# Patient Record
Sex: Male | Born: 1935 | Hispanic: No | Marital: Married | Smoking: Former smoker
Health system: Southern US, Community
[De-identification: ages and names within clinical notes are randomized; demographics above are authoritative.]

## PROBLEM LIST (undated history)

## (undated) DIAGNOSIS — IMO0002 Reserved for concepts with insufficient information to code with codable children: Secondary | ICD-10-CM

## (undated) DIAGNOSIS — R42 Dizziness and giddiness: Secondary | ICD-10-CM

## (undated) DIAGNOSIS — E1165 Type 2 diabetes mellitus with hyperglycemia: Secondary | ICD-10-CM

## (undated) DIAGNOSIS — M5126 Other intervertebral disc displacement, lumbar region: Secondary | ICD-10-CM

## (undated) DIAGNOSIS — E785 Hyperlipidemia, unspecified: Secondary | ICD-10-CM

## (undated) DIAGNOSIS — K2950 Unspecified chronic gastritis without bleeding: Secondary | ICD-10-CM

## (undated) DIAGNOSIS — I10 Essential (primary) hypertension: Secondary | ICD-10-CM

## (undated) HISTORY — DX: Unspecified chronic gastritis without bleeding: K29.50

## (undated) HISTORY — PX: BACK SURGERY: SHX140

## (undated) HISTORY — DX: Type 2 diabetes mellitus with hyperglycemia: E11.65

## (undated) HISTORY — DX: Dizziness and giddiness: R42

## (undated) HISTORY — DX: Reserved for concepts with insufficient information to code with codable children: IMO0002

## (undated) HISTORY — PX: COLONOSCOPY: SHX174

## (undated) HISTORY — DX: Other intervertebral disc displacement, lumbar region: M51.26

## (undated) HISTORY — DX: Essential (primary) hypertension: I10

---

## 2004-09-25 ENCOUNTER — Ambulatory Visit (HOSPITAL_COMMUNITY): Admission: RE | Admit: 2004-09-25 | Discharge: 2004-09-25 | Payer: Self-pay | Admitting: *Deleted

## 2004-09-25 ENCOUNTER — Encounter (INDEPENDENT_AMBULATORY_CARE_PROVIDER_SITE_OTHER): Payer: Self-pay | Admitting: Specialist

## 2007-02-19 ENCOUNTER — Encounter: Admission: RE | Admit: 2007-02-19 | Discharge: 2007-02-19 | Payer: Self-pay | Admitting: Internal Medicine

## 2010-12-28 NOTE — Op Note (Signed)
NAME:  Aaron Blair, Aaron Blair                  ACCOUNT NO.:  [id number]   MEDICAL RECORD NO.:  [id number]          PATIENT TYPE:  AMB   LOCATION:  ENDO                         FACILITY:  [facility]   PHYSICIAN:  Georgiana Spinner, M.D.    DATE OF BIRTH:  Sep 14, 1933   DATE OF PROCEDURE:  09/25/2004  DATE OF DISCHARGE:                                 OPERATIVE REPORT   PROCEDURE:  Endoscopy.   ANESTHESIA:  Demerol 50 mg, Versed 5 mg.   INDICATIONS:  Rectal bleeding.   PROCEDURE:  With the patient mildly sedated in the left lateral decubitus  position, the Olympus videoscopic endoscope was inserted in the mouth,  passed under direct vision into the esophagus, which appeared normal.  We  entered into the stomach.  The fundus, body, antrum, duodenal bulb, second  portion of duodenum were visualized.  From this point the endoscope was  slowly withdrawn taking circumferential views of the duodenal mucosa until  the endoscope had been pulled back into the stomach, placed in retroflexion  to view the stomach from below.  An incomplete wrap of the GE junction  around the endoscope was noted.  The endoscope was then straightened and  withdrawn taking circumferential views of the remaining gastric and  esophageal mucosa, stopping in the body of the stomach to biopsy  erythematous changes seen.  The patient's vital signs and pulse oximetry  remained stable.  The patient tolerated the procedure well without apparent  complications.   FINDINGS:  Erythematous changes of body of stomach, biopsied.   Await biopsy report.  The patient will call me for results and follow up  with me as an outpatient.  Proceed to colonoscopy as planned.      GMO/MEDQ  D:  09/25/2004  T:  09/25/2004  Job:  [id number]

## 2010-12-28 NOTE — Op Note (Signed)
NAME:  Willaims, Mivaan                  ACCOUNT NO.:  [id number]   MEDICAL RECORD NO.:  [id number]          PATIENT TYPE:  AMB   LOCATION:  ENDO                         FACILITY:  [facility]   PHYSICIAN:  Georgiana Spinner, M.D.    DATE OF BIRTH:  1934-02-03   DATE OF PROCEDURE:  DATE OF DISCHARGE:                                 OPERATIVE REPORT   PROCEDURE:  Colonoscopy.   INDICATIONS:  Colon polyp, rectal bleed.   ANESTHESIA:  Demerol 25 mg.   PROCEDURE:  With the patient mildly sedated in the left lateral decubitus  position, the Olympus videoscopic colonoscope was inserted into the rectum,  passed under direct vision to the cecum identified by ileocecal valve and  appendiceal orifice both of which were photographed.  From this point, the  colonoscope was slowly withdrawn taking circumferential views of the colonic  mucosa stopping at 25 cm from the anal verge at which point a polyp was  seen, photographed and removed using hot biopsy forceps technique, setting  at 20/200 blended current.  The endoscope was then withdrawn to the rectum  which appeared normal on direct and showed hemorrhoidal tissue on  retroflexed view.  The endoscope was straightened and withdrawn.  The  patient's vital signs and pulse oximeter remained stable.  The patient  tolerated the procedure well without apparent complications.   FINDINGS:  Internal hemorrhoids, polyp at 25 cm.  Await biopsy result.  The  patient will call me for results and follow up with me as an outpatient.      GMO/MEDQ  D:  09/25/2004  T:  09/25/2004  Job:  [id number]

## 2012-03-11 ENCOUNTER — Other Ambulatory Visit: Payer: Self-pay | Admitting: Internal Medicine

## 2012-03-11 DIAGNOSIS — R7989 Other specified abnormal findings of blood chemistry: Secondary | ICD-10-CM

## 2012-03-25 ENCOUNTER — Ambulatory Visit
Admission: RE | Admit: 2012-03-25 | Discharge: 2012-03-25 | Disposition: A | Discharge status: Home / Self Care | Payer: Medicare Other | Source: Ambulatory Visit | Attending: Internal Medicine | Admitting: Internal Medicine

## 2012-03-25 DIAGNOSIS — R7989 Other specified abnormal findings of blood chemistry: Secondary | ICD-10-CM

## 2013-08-25 ENCOUNTER — Other Ambulatory Visit: Payer: Self-pay | Admitting: Internal Medicine

## 2013-08-25 DIAGNOSIS — R413 Other amnesia: Secondary | ICD-10-CM

## 2013-08-28 ENCOUNTER — Ambulatory Visit
Admission: RE | Admit: 2013-08-28 | Discharge: 2013-08-28 | Disposition: A | Discharge status: Home / Self Care | Payer: Medicare Other | Source: Ambulatory Visit | Attending: Internal Medicine | Admitting: Internal Medicine

## 2013-08-28 DIAGNOSIS — R413 Other amnesia: Secondary | ICD-10-CM

## 2014-09-13 DIAGNOSIS — E1165 Type 2 diabetes mellitus with hyperglycemia: Secondary | ICD-10-CM | POA: Diagnosis not present

## 2014-09-13 DIAGNOSIS — I10 Essential (primary) hypertension: Secondary | ICD-10-CM | POA: Diagnosis not present

## 2014-10-10 DIAGNOSIS — E1122 Type 2 diabetes mellitus with diabetic chronic kidney disease: Secondary | ICD-10-CM | POA: Diagnosis not present

## 2014-10-26 DIAGNOSIS — E1165 Type 2 diabetes mellitus with hyperglycemia: Secondary | ICD-10-CM | POA: Diagnosis not present

## 2014-10-26 DIAGNOSIS — R809 Proteinuria, unspecified: Secondary | ICD-10-CM | POA: Diagnosis not present

## 2015-01-04 DIAGNOSIS — Z0000 Encounter for general adult medical examination without abnormal findings: Secondary | ICD-10-CM | POA: Diagnosis not present

## 2015-01-04 DIAGNOSIS — Z1389 Encounter for screening for other disorder: Secondary | ICD-10-CM | POA: Diagnosis not present

## 2015-01-04 DIAGNOSIS — E1165 Type 2 diabetes mellitus with hyperglycemia: Secondary | ICD-10-CM | POA: Diagnosis not present

## 2015-04-06 DIAGNOSIS — E1165 Type 2 diabetes mellitus with hyperglycemia: Secondary | ICD-10-CM | POA: Diagnosis not present

## 2015-04-07 DIAGNOSIS — E1165 Type 2 diabetes mellitus with hyperglycemia: Secondary | ICD-10-CM | POA: Diagnosis not present

## 2015-04-11 DIAGNOSIS — E119 Type 2 diabetes mellitus without complications: Secondary | ICD-10-CM | POA: Diagnosis not present

## 2015-04-11 DIAGNOSIS — Z23 Encounter for immunization: Secondary | ICD-10-CM | POA: Diagnosis not present

## 2015-04-11 DIAGNOSIS — E785 Hyperlipidemia, unspecified: Secondary | ICD-10-CM | POA: Diagnosis not present

## 2015-04-11 DIAGNOSIS — I10 Essential (primary) hypertension: Secondary | ICD-10-CM | POA: Diagnosis not present

## 2015-04-11 DIAGNOSIS — R809 Proteinuria, unspecified: Secondary | ICD-10-CM | POA: Diagnosis not present

## 2015-04-27 ENCOUNTER — Encounter: Payer: Self-pay | Admitting: Neurology

## 2015-04-27 ENCOUNTER — Ambulatory Visit (INDEPENDENT_AMBULATORY_CARE_PROVIDER_SITE_OTHER): Payer: Medicare Other | Admitting: Neurology

## 2015-04-27 VITALS — BP 128/77 | HR 75 | Ht 62.0 in | Wt 148.0 lb

## 2015-04-27 DIAGNOSIS — I10 Essential (primary) hypertension: Secondary | ICD-10-CM

## 2015-04-27 DIAGNOSIS — E1169 Type 2 diabetes mellitus with other specified complication: Secondary | ICD-10-CM | POA: Diagnosis not present

## 2015-04-27 DIAGNOSIS — R42 Dizziness and giddiness: Secondary | ICD-10-CM

## 2015-04-27 NOTE — Progress Notes (Signed)
PATIENT: Aaron Blair DOB: September 01, 1933  Chief Complaint  Patient presents with  . Dizziness    Ortostatics: Lying 128/77, 63 - Sitting 149/89, 77 - Standing 146/81, 74. He is here with his son, Vonna Kotyk.  Reports an event of constant, severe dizziness that lasted for one week.  His symptoms worsened with positional changes.  Says the dizziness has improved but he is still has occasional episodes.     HISTORICAL  Aaron Blair is a 81 years old right-handed male , seen in refer by his primary care physician Dr. Pearson Grippe for evaluation of acute onset dizziness   He has vascular risk factor of hypertension diabetes hyperlipidemia, previously smoker   Presented with acute onset of dizziness woke up one morning August 2016, he felt the room spinning, nauseous, unsteady gait, double vision, symptoms has gradually improved over the next 3 4 weeks, but he still has mild unsteady gait, with sudden positional change, he has mild dizziness,   He is a Retail banker, very active, he denies significant gait difficulty, take daily aspirin,  He also complains of intermittent chest pain, it happened when he was still, sharp needle pricking sensation, lasting for few minutes.  REVIEW OF SYSTEMS: Full 14 system review of systems performed and notable only for as above  ALLERGIES: No Known Allergies  HOME MEDICATIONS: Current Outpatient Prescriptions  Medication Sig Dispense Refill  . amLODipine (NORVASC) 5 MG tablet Take 5 mg by mouth daily.    Marland Kitchen aspirin 81 MG tablet Take 81 mg by mouth daily.    . Camphor-Menthol-Methyl Sal (TIGER BALM MUSCLE RUB) 10-14-13 % CREA Apply topically.    Marland Kitchen glimepiride (AMARYL) 2 MG tablet Take 2 mg by mouth daily with breakfast.    . pioglitazone (ACTOS) 30 MG tablet Take 30 mg by mouth daily.    . SitaGLIPtin-MetFORMIN HCl (JANUMET XR) [phone] MG TB24 Take by mouth 2 (two) times daily.    . valsartan-hydrochlorothiazide (DIOVAN-HCT) 320-25 MG per tablet Take 1  tablet by mouth daily.     No current facility-administered medications for this visit.    PAST MEDICAL HISTORY: Past Medical History  Diagnosis Date  . Hypertension   . Diabetes   . Chronic gastritis   . Lumbar herniated disc     L3-4  . Vertigo     PAST SURGICAL HISTORY: Past Surgical History  Procedure Laterality Date  . No past surgery      FAMILY HISTORY: No family history on file.  SOCIAL HISTORY:  Social History   Social History  . Marital Status: Married    Spouse Name: N/A  . Number of Children: 3  . Years of Education: 16   Occupational History  . 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 Smoking status: Former Games developer  . Smokeless tobacco: Not on file     Comment: Quit 3 years ago.  . Alcohol Use: No  . Drug Use: No  . Sexual Activity: Not on file   Other Topics Concern  . Not on file   Social History Narrative   Lives at home with his wife and son.   Right-handed.   1 cup caffeine per day.     PHYSICAL EXAM   Filed Vitals:   04/27/15 0756  BP: 128/77  Pulse: 75  Height:  (1.575 m)  Weight: 148 lb (67.132 kg)    Not recorded      Body mass index  is 27.06 kg/(m^2).  PHYSICAL EXAMNIATION:  Gen: NAD, conversant, well nourised, obese, well groomed                     Cardiovascular: Regular rate rhythm, no peripheral edema, warm, nontender. Eyes: Conjunctivae clear without exudates or hemorrhage Neck: Supple, no carotid bruise. Pulmonary: Clear to auscultation bilaterally   NEUROLOGICAL EXAM:  MENTAL STATUS: Speech:    Speech is normal; fluent and spontaneous with normal comprehension.  Cognition:     Orientation to time, place and person     Normal recent and remote memory     Normal Attention span and concentration     Normal Language, naming, repeating,spontaneous speech     Fund of knowledge   CRANIAL NERVES: CN II: Visual fields are full to confrontation. Fundoscopic exam is normal  with sharp discs and no vascular changes. Pupils are round equal and briskly reactive to light. CN III, IV, VI: extraocular movement are normal. No ptosis. CN V: Facial sensation is intact to pinprick in all 3 divisions bilaterally. Corneal responses are intact.  CN VII: Face is symmetric with normal eye closure and smile. CN VIII: Hearing is normal to rubbing fingers CN IX, X: Palate elevates symmetrically. Phonation is normal. CN XI: Head turning and shoulder shrug are intact CN XII: Tongue is midline with normal movements and no atrophy.  MOTOR: There is no pronator drift of out-stretched arms. Muscle bulk and tone are normal. Muscle strength is normal.  REFLEXES: Reflexes are 2+ and symmetric at the biceps, triceps, knees, and ankles. Plantar responses are flexor.  SENSORY: Intact to light touch, pinprick, position sense, and vibration sense are intact in fingers and toes.  COORDINATION: Rapid alternating movements and fine finger movements are intact. There is no dysmetria on finger-to-nose and heel-knee-shin.    GAIT/STANCE: Posture is normal. Gait is steady with normal steps, base, arm swing, and turning. Heel and toe walking are normal. Tandem gait is normal.  Romberg is absent.   DIAGNOSTIC DATA (LABS, IMAGING, TESTING) - I reviewed patient records, labs, notes, testing and imaging myself where available.   ASSESSMENT AND PLAN  Tina B Scheff is a 81 y.o. male   Acute onset vertigo, nausea, blurry vision, unsteady gait  Most consistent with a brainstem/cerebellar stroke  He has multiple vascular risk factors, hypertension, hyperlipidemia, diabetes, aging, previous smoker  Keep aspirin, keep well hydration  MRI of the brain without contrast  Return to clinic in 2-3 weeks  May consider echocardiogram, ultrasound of carotid artery, EKG,     Levert Feinstein, M.D. Ph.D.  [facility] [address] [place], [place] [address] Ph: [phone] Fax:  [phone]  CC: Dr. Pearson Grippe

## 2015-05-23 ENCOUNTER — Ambulatory Visit: Payer: Medicare Other | Admitting: Neurology

## 2015-07-10 DIAGNOSIS — E785 Hyperlipidemia, unspecified: Secondary | ICD-10-CM | POA: Diagnosis not present

## 2015-07-10 DIAGNOSIS — E119 Type 2 diabetes mellitus without complications: Secondary | ICD-10-CM | POA: Diagnosis not present

## 2015-07-14 DIAGNOSIS — E119 Type 2 diabetes mellitus without complications: Secondary | ICD-10-CM | POA: Diagnosis not present

## 2015-07-14 DIAGNOSIS — E7800 Pure hypercholesterolemia, unspecified: Secondary | ICD-10-CM | POA: Diagnosis not present

## 2015-07-14 DIAGNOSIS — I10 Essential (primary) hypertension: Secondary | ICD-10-CM | POA: Diagnosis not present

## 2015-07-14 DIAGNOSIS — Z0000 Encounter for general adult medical examination without abnormal findings: Secondary | ICD-10-CM | POA: Diagnosis not present

## 2015-10-04 DIAGNOSIS — R079 Chest pain, unspecified: Secondary | ICD-10-CM | POA: Diagnosis not present

## 2015-10-04 DIAGNOSIS — B009 Herpesviral infection, unspecified: Secondary | ICD-10-CM | POA: Diagnosis not present

## 2015-10-05 DIAGNOSIS — I358 Other nonrheumatic aortic valve disorders: Secondary | ICD-10-CM | POA: Diagnosis not present

## 2015-10-05 DIAGNOSIS — E119 Type 2 diabetes mellitus without complications: Secondary | ICD-10-CM | POA: Diagnosis not present

## 2015-10-05 DIAGNOSIS — R0989 Other specified symptoms and signs involving the circulatory and respiratory systems: Secondary | ICD-10-CM | POA: Diagnosis not present

## 2015-10-05 DIAGNOSIS — R0789 Other chest pain: Secondary | ICD-10-CM | POA: Diagnosis not present

## 2015-10-13 DIAGNOSIS — R0789 Other chest pain: Secondary | ICD-10-CM | POA: Diagnosis not present

## 2015-10-13 DIAGNOSIS — E119 Type 2 diabetes mellitus without complications: Secondary | ICD-10-CM | POA: Diagnosis not present

## 2015-10-13 DIAGNOSIS — E782 Mixed hyperlipidemia: Secondary | ICD-10-CM | POA: Diagnosis not present

## 2015-12-23 LAB — POCT LIPID PANEL
HDL: 58
LDL: 165
TC: 259
TRG: 182

## 2015-12-23 LAB — GLUCOSE, POCT (MANUAL RESULT ENTRY): POC Glucose: 146 mg/dl — AB (ref 70–99)

## 2016-01-16 ENCOUNTER — Telehealth: Payer: Self-pay | Admitting: *Deleted

## 2016-01-20 ENCOUNTER — Telehealth: Payer: Self-pay | Admitting: *Deleted

## 2016-01-23 ENCOUNTER — Telehealth: Payer: Self-pay | Admitting: Neurology

## 2016-01-23 NOTE — Telephone Encounter (Signed)
Pt's son called and said that his father had a steroid shot in his back and is in severe pain and wants to see if we can see him ASAP because they are unable to get in touch with the PCP's ofc/ we had received a referral Frm Dr. Selena BattenKim requesting that patient be seen by Dr. Epimenio FootSater I explained to pt's son that I would have to speak with Dr. Terrace ArabiaYan and Dr. Epimenio FootSater to get approval for pt to switch his care/plase call Jae the son

## 2016-01-23 NOTE — Telephone Encounter (Signed)
Pt is wanting to switch his care to Dr. Epimenio FootSater and we have gotten a new referral From Dr. Elmyra RicksKim's ofc asking to schedule this patient with Dr. Epimenio FootSater. Pt is a pt of Dr. Zannie Coveyan's is it ok for pt to be scheduled w Dr. Epimenio FootSater?

## 2016-01-23 NOTE — Telephone Encounter (Signed)
I could see if patient and Dr. Terrace ArabiaYan want me too

## 2016-01-24 NOTE — Telephone Encounter (Signed)
It is ok for him to see Dr. Epimenio FootSater.

## 2016-02-05 ENCOUNTER — Encounter: Payer: Self-pay | Admitting: *Deleted

## 2016-02-05 HISTORY — PX: SPINE SURGERY: SHX786

## 2016-02-07 ENCOUNTER — Telehealth: Payer: Self-pay | Admitting: *Deleted

## 2016-02-07 NOTE — Congregational Nurse Program (Unsigned)
Congregational Nurse Program Note  Date of Encounter: 02/05/2016  Past Medical History: Past Medical History  Diagnosis Date  . Hypertension   . Diabetes   . Chronic gastritis   . Lumbar herniated disc     L3-4  . Vertigo     Encounter Details:     CNP Questionnaire - 02/05/16 0710    Patient Demographics   Is this a new or existing patient? Existing   Patient is considered a/an Immigrant   Race Asian   Patient Assistance   [facility]   Patient's financial/insurance status Medicare   Uninsured Patient No   Patient referred to apply for the following financial assistance Not Applicable   Food insecurities addressed Not Applicable   Transportation assistance No   Assistance securing medications No   Educational health offerings Diabetes;Spiritual care;Safety;Other  staying for surgery, til discharge. DC instruction translated   Encounter Details   Primary purpose of visit Other  surgery, translate   Was an Emergency Department visit averted? Not Applicable   Does patient have a medical provider? Yes   Patient referred to Not Applicable   Was a mental health screening completed? (GAINS tool) No   Does patient have dental issues? No   Does patient have vision issues? No   Does your patient have an abnormal blood pressure today? Yes  not take med due to surg.   Since previous encounter, have you referred patient for abnormal blood pressure that resulted in a new diagnosis or medication change? No   Does your patient have an abnormal blood glucose today? Yes  CNG 175 prior surg, 197 after surg. did not take med    Since previous encounter, have you referred patient for abnormal blood glucose that resulted in a new diagnosis or medication change? No   Was there a life-saving intervention made? No     stayed til discharge home. Translated. Will f/u

## 2016-02-09 ENCOUNTER — Telehealth: Payer: Self-pay | Admitting: *Deleted

## 2016-02-13 ENCOUNTER — Encounter: Payer: Self-pay | Admitting: *Deleted

## 2016-05-15 DIAGNOSIS — E1165 Type 2 diabetes mellitus with hyperglycemia: Secondary | ICD-10-CM | POA: Diagnosis not present

## 2016-05-15 DIAGNOSIS — I10 Essential (primary) hypertension: Secondary | ICD-10-CM | POA: Diagnosis not present

## 2016-05-22 DIAGNOSIS — E119 Type 2 diabetes mellitus without complications: Secondary | ICD-10-CM | POA: Diagnosis not present

## 2016-05-22 DIAGNOSIS — I10 Essential (primary) hypertension: Secondary | ICD-10-CM | POA: Diagnosis not present

## 2016-05-22 DIAGNOSIS — E785 Hyperlipidemia, unspecified: Secondary | ICD-10-CM | POA: Diagnosis not present

## 2016-06-13 DIAGNOSIS — I6523 Occlusion and stenosis of bilateral carotid arteries: Secondary | ICD-10-CM | POA: Diagnosis not present

## 2016-06-20 DIAGNOSIS — I10 Essential (primary) hypertension: Secondary | ICD-10-CM | POA: Diagnosis not present

## 2016-06-20 DIAGNOSIS — E118 Type 2 diabetes mellitus with unspecified complications: Secondary | ICD-10-CM | POA: Diagnosis not present

## 2016-06-20 DIAGNOSIS — E782 Mixed hyperlipidemia: Secondary | ICD-10-CM | POA: Diagnosis not present

## 2016-06-20 DIAGNOSIS — I6522 Occlusion and stenosis of left carotid artery: Secondary | ICD-10-CM | POA: Diagnosis not present

## 2016-06-25 ENCOUNTER — Other Ambulatory Visit: Payer: Self-pay | Admitting: *Deleted

## 2016-06-25 DIAGNOSIS — I6523 Occlusion and stenosis of bilateral carotid arteries: Secondary | ICD-10-CM

## 2016-06-26 ENCOUNTER — Encounter: Payer: Self-pay | Admitting: Vascular Surgery

## 2016-06-27 ENCOUNTER — Encounter: Payer: Self-pay | Admitting: Vascular Surgery

## 2016-06-27 ENCOUNTER — Ambulatory Visit (INDEPENDENT_AMBULATORY_CARE_PROVIDER_SITE_OTHER): Payer: Medicare Other | Admitting: Vascular Surgery

## 2016-06-27 ENCOUNTER — Ambulatory Visit (HOSPITAL_COMMUNITY)
Admission: RE | Admit: 2016-06-27 | Discharge: 2016-06-27 | Disposition: A | Discharge status: Home / Self Care | Payer: Medicare Other | Source: Ambulatory Visit | Attending: Vascular Surgery | Admitting: Vascular Surgery

## 2016-06-27 VITALS — BP 143/83 | HR 87 | Resp 18 | Ht 62.5 in | Wt 154.6 lb

## 2016-06-27 DIAGNOSIS — I6523 Occlusion and stenosis of bilateral carotid arteries: Secondary | ICD-10-CM

## 2016-06-27 DIAGNOSIS — I6522 Occlusion and stenosis of left carotid artery: Secondary | ICD-10-CM | POA: Diagnosis not present

## 2016-06-27 LAB — VAS US CAROTID
LEFT ECA DIAS: -13 cm/s
LEFT VERTEBRAL DIAS: 8 cm/s
Left CCA prox dias: 12 cm/s
Left CCA prox sys: 105 cm/s

## 2016-06-27 NOTE — Progress Notes (Signed)
HISTORY AND PHYSICAL     CC:  Left carotid artery stenosis Referring Provider:  Sherilyn Banker, Md HPI: This is a 82 y.o. male who was referred by Aaron Blair for left carotid artery stenosis.  He had a carotid duplex by Aaron Blair on 06/13/16, which revealed critical left carotid artery stenosis.  Patient is [place] and some of the interview today was conducted through his son acting as interpreter.  He states he has not had any amaurosis fugax, hemiparesis or hemiplegia or difficulty with speech.  He has never had a heart attack.  He does have diabetes that he takes oral agents for.  He was on a Crestor for cholesterol management, however, he did have leg cramps and this was discontinued.  At his last visit with Aaron Blair, he was started on Livalo, which is also a statin.  He is on Zetia.  He takes an ARB and CCB for blood pressure management.  He does take a daily aspirin.    The pt had a stress test earlier this year and was normal with an EF of 76%.    He denies any shortness of breath, chest pain, or claudication.  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    He has a hx of tobacco use, however, he quit 5 years ago b/c his wife wanted him to.  He does not drink alcohol.    His son states he recently had lower back surgery for a herniated disk.  He has never had any surgery on his neck.   Past Medical History:  Diagnosis Date  . Chronic gastritis   . Diabetes (HCC)   . Hypertension   . Lumbar herniated disc    L3-4  . Vertigo     Past Surgical History:  Procedure Laterality Date  . No past surgery    . SPINE SURGERY  02/05/2016   Aaron Blair    No Known Allergies  Current Outpatient Prescriptions  Medication Sig Dispense Refill  . amLODipine (NORVASC) 5 MG tablet Take 5 mg by mouth daily.    Marland Kitchen aspirin 81 MG tablet Take 81 mg by mouth daily.    . Camphor-Menthol-Methyl Sal (TIGER BALM MUSCLE RUB) 10-14-13 % CREA Apply  topically.    Marland Kitchen ezetimibe (ZETIA) 10 MG tablet     . glimepiride (AMARYL) 2 MG tablet Take 2 mg by mouth daily with breakfast.    . pioglitazone (ACTOS) 30 MG tablet Take 30 mg by mouth daily.    . SitaGLIPtin-MetFORMIN HCl (JANUMET XR) [phone] MG TB24 Take by mouth 2 (two) times daily.    . valsartan-hydrochlorothiazide (DIOVAN-HCT) 320-25 MG per tablet Take 1 tablet by mouth daily.     No current facility-administered medications for this visit.     No family history on file.  Social History   Social History  . Marital status: Married    Spouse name: N/A  . Number of children: 3  . Years of education: 30   Occupational History  . 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 Smoking status: Former Smoker    Quit date: 06/28/2011  . Smokeless tobacco: Never Used     Comment: Quit 3 years ago.  . Alcohol use No  . Drug use: No  . Sexual activity: Not on file   Other Topics Concern  . Not on file   Social History Narrative  Lives at home with his wife and son.   Right-handed.   1 cup caffeine per day.     REVIEW OF SYSTEMS:   [X]  denotes positive finding, [ ]  denotes negative finding Cardiac  Comments:  Chest pain or chest pressure:    Shortness of breath upon exertion:    Short of breath when lying flat:    Irregular heart rhythm:        Vascular    Pain in calf, thigh, or hip brought on by ambulation:    Pain in feet at night that wakes you up from your sleep:     Blood clot in your veins:    Leg swelling:         Pulmonary    Oxygen at home:    Productive cough:     Wheezing:         Neurologic    Sudden weakness in arms or legs:     Sudden numbness in arms or legs:     Sudden onset of difficulty speaking or slurred speech:    Temporary loss of vision in one eye:     Problems with dizziness:         Gastrointestinal    Blood in stool:     Vomited blood:         Genitourinary    Burning when urinating:     Blood in  urine:        Psychiatric    Major depression:         Hematologic    Bleeding problems:    Problems with blood clotting too easily:        Skin    Rashes or ulcers:        Constitutional    Fever or chills:      PHYSICAL EXAMINATION:  Vitals:   06/27/16 1356 06/27/16 1357  BP: (!) 149/82 (!) 143/83  Pulse: 87   Resp: 18    Body mass index is 27.83 kg/m.  General:  WDWN in NAD; vital signs documented above Gait: Not observed HENT: WNL, normocephalic Pulmonary: normal non-labored breathing , without Rales, rhonchi,  wheezing Cardiac: regular HR, without  Murmurs, rubs or gallops; with bilateral carotid bruits Abdomen: soft, NT, no masses Skin: without rashes Vascular Exam/Pulses:  Right Left  Radial 2+ (normal) 2+ (normal)  Ulnar 2+ (normal) 2+ (normal)  Femoral 2+ (normal) 2+ (normal)  DP 2+ (normal) 2+ (normal)  PT 2+ (normal) 2+ (normal)   Extremities: without ischemic changes, without Gangrene , without cellulitis; without open wounds;  Musculoskeletal: no muscle wasting or atrophy  Neurologic: A&O X 3;  No focal weakness or paresthesias are detected Psychiatric:  The pt has Normal affect.   Non-Invasive Vascular Imaging:   Left Carotid duplex 06/27/16: 80-99% left internal carotid artery stenosis (No significant change from [facility]/16 [facility])  Carot[facility]: Left with 80-99% carotid artery stenosis Right carotid artery is within normal limits  Pt meds includes: Statin:  Yes.   Beta Blocker:  No. Aspirin:  Yes.   ACEI:  No. ARB:  Yes.   Other Antiplatelet/Anticoagulant:  No.    ASSESSMENT/PLAN:: 82 y.o. male with asymptomatic critical left carotid artery stenosis   -Dr. Darrick Blair recommended a left carotid endarterectomy to the pt and explained to him and his son that there is about a 0.5% chance of stroke from the operation and a 5% chance of stroke over the next year  if he does not have  the procedure.  -the son translated to the pt and he wants to talk about this with his family and will call us to schedule surgery if he decides to proceed.     Doreatha MassedSamantha Rhyne, PA-C [facility] [phone]  Clinic MD:  Pt seen and examined in conjunction with Dr. Darrick Blair  History and exam details as above. The patient currently wishes to discuss with his family further before proceeding with left carotid endarterectomy. He will call us in the near future if he wishes to proceed. Otherwise he would need a follow-up carotid duplex scan in 6 months.  Fabienne Brunsharles Fields, MD [facility] [place] Office: [phone] Pager: [phone]

## 2016-07-03 ENCOUNTER — Encounter: Payer: Self-pay | Admitting: Cardiology

## 2016-07-09 ENCOUNTER — Other Ambulatory Visit: Payer: Self-pay | Admitting: *Deleted

## 2016-07-15 ENCOUNTER — Encounter: Payer: Self-pay | Admitting: *Deleted

## 2016-07-15 NOTE — Congregational Nurse Program (Unsigned)
Congregational Nurse Program Note  Date of Encounter: 07/14/2016 Past Medical History: Past Medical History:  Diagnosis Date  . Chronic gastritis   . Diabetes (HCC)   . Hypertension   . Lumbar herniated disc    L3-4  . Vertigo     Encounter Details:     CNP Questionnaire - 06/26/16 1830      Patient Demographics   Is this a new or existing patient? Existing   Patient is considered a/an Immigrant   Race Asian     Patient Assistance   [facility]   Patient's financial/insurance status Medicare   Uninsured Patient ([employer] officer, trade unionCard/Care Connects) No   Patient referred to apply for the following financial assistance Not Applicable   Food insecurities addressed Not Applicable   Transportation assistance No   Assistance securing medications No   Educational health offerings Other  endartarectomy     Encounter Details   Primary purpose of visit Education/Health Concerns   Was an Emergency Department visit averted? Not Applicable   Does patient have a medical provider? Yes   Patient referred to Other  encouraged to follow MD's advice   Was a mental health screening completed? (GAINS tool) No   Does patient have dental issues? No   Does patient have vision issues? No   Does your patient have an abnormal blood pressure today? Yes  148/80 on med, per wife. not check this visit   Since previous encounter, have you referred patient for abnormal blood pressure that resulted in a new diagnosis or medication change? No   Does your patient have an abnormal blood glucose today? No  not checked this visit   Since previous encounter, have you referred patient for abnormal blood glucose that resulted in a new diagnosis or medication change? No   Was there a life-saving intervention made? No         Amb Nursing Assessment - 06/27/16 1354      Pain Assessment   Pain Assessment No/denies pain     Functional Status   Ambulation Independent   Home Management --  lives at home    will have surgery on 07/17/2016 per wife.

## 2016-07-15 NOTE — Congregational Nurse Program (Unsigned)
Congregational Nurse Program Note  Date of Encounter: 06/26/2016 Past Medical History: Past Medical History:  Diagnosis Date  . Chronic gastritis   . Diabetes (HCC)   . Hypertension   . Lumbar herniated disc    L3-4  . Vertigo     Encounter Details:     CNP Questionnaire - 06/26/16 1830      Patient Demographics   Is this a new or existing patient? Existing   Patient is considered a/an Immigrant   Race Asian     Patient Assistance   [facility]   Patient's financial/insurance status Medicare   Uninsured Patient ([employer] officer, trade unionCard/Care Connects) No   Patient referred to apply for the following financial assistance Not Applicable   Food insecurities addressed Not Applicable   Transportation assistance No   Assistance securing medications No   Educational health offerings Other  endartarectomy     Encounter Details   Primary purpose of visit Education/Health Concerns   Was an Emergency Department visit averted? Not Applicable   Does patient have a medical provider? Yes   Patient referred to Other  encouraged to follow MD's advice   Was a mental health screening completed? (GAINS tool) No   Does patient have dental issues? No   Does patient have vision issues? No   Does your patient have an abnormal blood pressure today? Yes  148/80 on med, per wife. not check this visit   Since previous encounter, have you referred patient for abnormal blood pressure that resulted in a new diagnosis or medication change? No   Does your patient have an abnormal blood glucose today? No  not checked this visit   Since previous encounter, have you referred patient for abnormal blood glucose that resulted in a new diagnosis or medication change? No   Was there a life-saving intervention made? No         Amb Nursing Assessment - 06/27/16 1354      Pain Assessment   Pain Assessment No/denies pain     Functional Status   Ambulation Independent   Home Management --  lives at home     Wife vs office today said Pt  Need to surgery neck per Dr. Jacinto HalimGanji, obstructed 80% But Pt refused to f/u with vascular surgeon. Education given and procedure of neck srgery and advised to f/u with vascular surgeon. Will f/u later.

## 2016-07-16 ENCOUNTER — Other Ambulatory Visit (HOSPITAL_COMMUNITY): Payer: Medicare Other

## 2016-07-16 ENCOUNTER — Encounter (HOSPITAL_COMMUNITY): Payer: Self-pay | Admitting: *Deleted

## 2016-07-16 NOTE — Progress Notes (Signed)
Patient's son Emelda FearJae asked that I speak to him, not have an interpreter speak with him tonight.  I instructed Jae that patient should not take medications for Diabetes in am.  I asked him to check CBG in am and if CBG less than 70 for patient to drink 1/2 cup of clear juice, recheck CBG in 15 minutes and and pre op.

## 2016-07-17 ENCOUNTER — Encounter (HOSPITAL_COMMUNITY): Payer: Self-pay | Admitting: Surgery

## 2016-07-17 ENCOUNTER — Inpatient Hospital Stay (HOSPITAL_COMMUNITY): Payer: Medicare Other | Admitting: Certified Registered"

## 2016-07-17 ENCOUNTER — Encounter (HOSPITAL_COMMUNITY)
Admission: RE | Disposition: A | Discharge status: Home / Self Care | Payer: Self-pay | Source: Ambulatory Visit | Attending: Vascular Surgery

## 2016-07-17 ENCOUNTER — Inpatient Hospital Stay (HOSPITAL_COMMUNITY)
Admission: RE | Admit: 2016-07-17 | Discharge: 2016-07-18 | DRG: 027 | Disposition: A | Discharge status: Home / Self Care | Payer: Medicare Other | Source: Ambulatory Visit | Attending: Vascular Surgery | Admitting: Vascular Surgery

## 2016-07-17 DIAGNOSIS — I10 Essential (primary) hypertension: Secondary | ICD-10-CM | POA: Diagnosis not present

## 2016-07-17 DIAGNOSIS — Z79899 Other long term (current) drug therapy: Secondary | ICD-10-CM

## 2016-07-17 DIAGNOSIS — E119 Type 2 diabetes mellitus without complications: Secondary | ICD-10-CM | POA: Diagnosis present

## 2016-07-17 DIAGNOSIS — Z87891 Personal history of nicotine dependence: Secondary | ICD-10-CM

## 2016-07-17 DIAGNOSIS — Z7982 Long term (current) use of aspirin: Secondary | ICD-10-CM

## 2016-07-17 DIAGNOSIS — I6522 Occlusion and stenosis of left carotid artery: Secondary | ICD-10-CM | POA: Diagnosis not present

## 2016-07-17 HISTORY — PX: ENDARTERECTOMY: SHX5162

## 2016-07-17 LAB — COMPREHENSIVE METABOLIC PANEL
ALT: 18 U/L (ref 17–63)
ANION GAP: 9 (ref 5–15)
AST: 22 U/L (ref 15–41)
Albumin: 3.5 g/dL (ref 3.5–5.0)
Alkaline Phosphatase: 36 U/L — ABNORMAL LOW (ref 38–126)
BILIRUBIN TOTAL: 1.1 mg/dL (ref 0.3–1.2)
BUN: 20 mg/dL (ref 6–20)
CHLORIDE: 103 mmol/L (ref 101–111)
CO2: 25 mmol/L (ref 22–32)
Calcium: 9.3 mg/dL (ref 8.9–10.3)
Creatinine, Ser: 0.8 mg/dL (ref 0.61–1.24)
Glucose, Bld: 163 mg/dL — ABNORMAL HIGH (ref 65–99)
POTASSIUM: 3.7 mmol/L (ref 3.5–5.1)
Sodium: 137 mmol/L (ref 135–145)
TOTAL PROTEIN: 6.1 g/dL — AB (ref 6.5–8.1)

## 2016-07-17 LAB — TYPE AND SCREEN
ABO/RH(D): AB POS
ANTIBODY SCREEN: NEGATIVE

## 2016-07-17 LAB — ABO/RH: ABO/RH(D): AB POS

## 2016-07-17 LAB — SURGICAL PCR SCREEN
MRSA, PCR: NEGATIVE
STAPHYLOCOCCUS AUREUS: NEGATIVE

## 2016-07-17 LAB — CBC
HEMATOCRIT: 40.9 % (ref 39.0–52.0)
Hemoglobin: 14.1 g/dL (ref 13.0–17.0)
MCH: 32 pg (ref 26.0–34.0)
MCHC: 34.5 g/dL (ref 30.0–36.0)
MCV: 93 fL (ref 78.0–100.0)
PLATELETS: 195 10*3/uL (ref 150–400)
RBC: 4.4 MIL/uL (ref 4.22–5.81)
RDW: 13.2 % (ref 11.5–15.5)
WBC: 5.5 10*3/uL (ref 4.0–10.5)

## 2016-07-17 LAB — PROTIME-INR
INR: 0.96
PROTHROMBIN TIME: 12.8 s (ref 11.4–15.2)

## 2016-07-17 LAB — APTT: aPTT: 33 seconds (ref 24–36)

## 2016-07-17 LAB — GLUCOSE, CAPILLARY: Glucose-Capillary: 167 mg/dL — ABNORMAL HIGH (ref 65–99)

## 2016-07-17 SURGERY — ENDARTERECTOMY, CAROTID
Anesthesia: General | Site: Neck | Laterality: Left

## 2016-07-17 MED ORDER — CHLORHEXIDINE GLUCONATE CLOTH 2 % EX PADS: 6.0000 | MEDICATED_PAD | Freq: Once | CUTANEOUS | Status: DC

## 2016-07-17 MED ORDER — HYDRALAZINE HCL 20 MG/ML IJ SOLN: 5.0000 mg | INTRAMUSCULAR | Status: DC | PRN

## 2016-07-17 MED ORDER — FENTANYL CITRATE (PF) 100 MCG/2ML IJ SOLN
25.0000 ug | INTRAMUSCULAR | Status: DC | PRN
  Administered 2016-07-17 (×2): 50 ug via INTRAVENOUS

## 2016-07-17 MED ORDER — ACETAMINOPHEN 325 MG RE SUPP: 325.0000 mg | RECTAL | Status: DC | PRN

## 2016-07-17 MED ORDER — DEXTROSE-NACL 5-0.45 % IV SOLN
INTRAVENOUS | Status: DC
Start: 2016-07-17 — End: 2016-07-18
  Administered 2016-07-17: 20:00:00 via INTRAVENOUS

## 2016-07-17 MED ORDER — ALUM & MAG HYDROXIDE-SIMETH 200-200-20 MG/5ML PO SUSP: 15.0000 mL | ORAL | Status: DC | PRN

## 2016-07-17 MED ORDER — HEPARIN SODIUM (PORCINE) 5000 UNIT/ML IJ SOLN
INTRAMUSCULAR | Status: DC | PRN
  Administered 2016-07-17: 13:00:00 500 mL

## 2016-07-17 MED ORDER — PROPOFOL 10 MG/ML IV BOLUS
INTRAVENOUS | Status: DC | PRN
  Administered 2016-07-17: 140 mg via INTRAVENOUS
  Administered 2016-07-17: 30 mg via INTRAVENOUS

## 2016-07-17 MED ORDER — ROCURONIUM BROMIDE 100 MG/10ML IV SOLN
INTRAVENOUS | Status: DC | PRN
  Administered 2016-07-17: 40 mg via INTRAVENOUS

## 2016-07-17 MED ORDER — SODIUM CHLORIDE 0.9 % IV SOLN: 500.0000 mL | Freq: Once | INTRAVENOUS | Status: DC | PRN

## 2016-07-17 MED ORDER — PROPOFOL 10 MG/ML IV BOLUS
INTRAVENOUS | Status: AC
  Filled 2016-07-17: qty 20

## 2016-07-17 MED ORDER — CAMPHOR-MENTHOL-METHYL SAL 3-5-15 % EX CREA: 1.0000 "application " | TOPICAL_CREAM | Freq: Every day | CUTANEOUS | Status: DC | PRN

## 2016-07-17 MED ORDER — DEXTROSE 5 % IV SOLN
1.5000 g | INTRAVENOUS | Status: AC
  Administered 2016-07-17: 1.5 g via INTRAVENOUS

## 2016-07-17 MED ORDER — GUAIFENESIN-DM 100-10 MG/5ML PO SYRP: 15.0000 mL | ORAL_SOLUTION | ORAL | Status: DC | PRN

## 2016-07-17 MED ORDER — DOCUSATE SODIUM 100 MG PO CAPS
100.0000 mg | ORAL_CAPSULE | Freq: Every day | ORAL | Status: DC
  Administered 2016-07-18: 100 mg via ORAL
  Filled 2016-07-17: qty 1

## 2016-07-17 MED ORDER — PHENOL 1.4 % MT LIQD: 1.0000 | OROMUCOSAL | Status: DC | PRN

## 2016-07-17 MED ORDER — REMIFENTANIL HCL 1 MG IV SOLR
0.0125 ug/kg/min | INTRAVENOUS | Status: DC
Start: 2016-07-17 — End: 2016-07-18
  Filled 2016-07-17: qty 2000

## 2016-07-17 MED ORDER — ONDANSETRON HCL 4 MG/2ML IJ SOLN
INTRAMUSCULAR | Status: AC
  Filled 2016-07-17: qty 2

## 2016-07-17 MED ORDER — VALSARTAN-HYDROCHLOROTHIAZIDE 320-25 MG PO TABS: 1.0000 | ORAL_TABLET | Freq: Every day | ORAL | Status: DC

## 2016-07-17 MED ORDER — MUSCLE RUB 10-15 % EX CREA
TOPICAL_CREAM | Freq: Every day | CUTANEOUS | Status: DC | PRN
  Filled 2016-07-17: qty 85

## 2016-07-17 MED ORDER — AMLODIPINE BESYLATE 5 MG PO TABS
5.0000 mg | ORAL_TABLET | Freq: Every day | ORAL | Status: DC
  Administered 2016-07-18: 5 mg via ORAL
  Filled 2016-07-17: qty 1

## 2016-07-17 MED ORDER — OXYCODONE-ACETAMINOPHEN 5-325 MG PO TABS
1.0000 | ORAL_TABLET | ORAL | Status: DC | PRN
  Administered 2016-07-18: 2 via ORAL
  Filled 2016-07-17: qty 2

## 2016-07-17 MED ORDER — LIDOCAINE 2% (20 MG/ML) 5 ML SYRINGE
INTRAMUSCULAR | Status: AC
  Filled 2016-07-17: qty 5

## 2016-07-17 MED ORDER — FENTANYL CITRATE (PF) 100 MCG/2ML IJ SOLN
INTRAMUSCULAR | Status: DC | PRN
  Administered 2016-07-17 (×2): 50 ug via INTRAVENOUS

## 2016-07-17 MED ORDER — ONDANSETRON HCL 4 MG/2ML IJ SOLN
INTRAMUSCULAR | Status: AC
Start: 2016-07-17 — End: 2016-07-17
  Filled 2016-07-17: qty 2

## 2016-07-17 MED ORDER — FENTANYL CITRATE (PF) 100 MCG/2ML IJ SOLN
INTRAMUSCULAR | Status: AC
  Administered 2016-07-17: 50 ug via INTRAVENOUS
  Filled 2016-07-17: qty 2

## 2016-07-17 MED ORDER — PHENYLEPHRINE HCL 10 MG/ML IJ SOLN
INTRAMUSCULAR | Status: DC | PRN
  Administered 2016-07-17: 30 ug/min via INTRAVENOUS

## 2016-07-17 MED ORDER — FENTANYL CITRATE (PF) 100 MCG/2ML IJ SOLN
INTRAMUSCULAR | Status: AC
  Filled 2016-07-17: qty 2

## 2016-07-17 MED ORDER — LACTATED RINGERS IV SOLN
INTRAVENOUS | Status: DC | PRN
  Administered 2016-07-17 (×2): via INTRAVENOUS

## 2016-07-17 MED ORDER — MORPHINE SULFATE (PF) 2 MG/ML IV SOLN: 2.0000 mg | INTRAVENOUS | Status: DC | PRN

## 2016-07-17 MED ORDER — ASPIRIN 81 MG PO TABS: 81.0000 mg | ORAL_TABLET | Freq: Every day | ORAL | Status: DC

## 2016-07-17 MED ORDER — ONDANSETRON HCL 4 MG/2ML IJ SOLN
INTRAMUSCULAR | Status: DC | PRN
  Administered 2016-07-17: 4 mg via INTRAVENOUS

## 2016-07-17 MED ORDER — IRBESARTAN 300 MG PO TABS
300.0000 mg | ORAL_TABLET | Freq: Every day | ORAL | Status: DC
  Administered 2016-07-18: 300 mg via ORAL
  Filled 2016-07-17: qty 1

## 2016-07-17 MED ORDER — EPHEDRINE SULFATE-NACL 50-0.9 MG/10ML-% IV SOSY
PREFILLED_SYRINGE | INTRAVENOUS | Status: DC | PRN
  Administered 2016-07-17: 5 mg via INTRAVENOUS
  Administered 2016-07-17: 10 mg via INTRAVENOUS

## 2016-07-17 MED ORDER — ACETAMINOPHEN 325 MG PO TABS: 325.0000 mg | ORAL_TABLET | ORAL | Status: DC | PRN

## 2016-07-17 MED ORDER — MUPIROCIN 2 % EX OINT
TOPICAL_OINTMENT | CUTANEOUS | Status: AC
  Filled 2016-07-17: qty 22

## 2016-07-17 MED ORDER — ONDANSETRON HCL 4 MG/2ML IJ SOLN
4.0000 mg | Freq: Four times a day (QID) | INTRAMUSCULAR | Status: DC | PRN
  Administered 2016-07-17: 4 mg via INTRAVENOUS

## 2016-07-17 MED ORDER — 0.9 % SODIUM CHLORIDE (POUR BTL) OPTIME
TOPICAL | Status: DC | PRN
  Administered 2016-07-17 (×2): 1000 mL

## 2016-07-17 MED ORDER — ONDANSETRON HCL 4 MG/2ML IJ SOLN
INTRAMUSCULAR | Status: AC
Start: 2016-07-17 — End: 2016-07-18
  Filled 2016-07-17: qty 2

## 2016-07-17 MED ORDER — DEXTROSE 5 % IV SOLN
INTRAVENOUS | Status: AC
  Filled 2016-07-17: qty 1.5

## 2016-07-17 MED ORDER — SUGAMMADEX SODIUM 200 MG/2ML IV SOLN
INTRAVENOUS | Status: DC | PRN
  Administered 2016-07-17: 150 mg via INTRAVENOUS

## 2016-07-17 MED ORDER — REMIFENTANIL HCL 2 MG IV SOLR
INTRAVENOUS | Status: DC | PRN
  Administered 2016-07-17: .2 ug/kg/min via INTRAVENOUS
  Administered 2016-07-17: .1 ug/kg/min via INTRAVENOUS

## 2016-07-17 MED ORDER — ASPIRIN EC 81 MG PO TBEC
81.0000 mg | DELAYED_RELEASE_TABLET | Freq: Every day | ORAL | Status: DC
  Administered 2016-07-18: 81 mg via ORAL
  Filled 2016-07-17: qty 1

## 2016-07-17 MED ORDER — HYDROCHLOROTHIAZIDE 25 MG PO TABS
25.0000 mg | ORAL_TABLET | Freq: Every day | ORAL | Status: DC
  Administered 2016-07-18: 25 mg via ORAL
  Filled 2016-07-17: qty 1

## 2016-07-17 MED ORDER — SODIUM CHLORIDE 0.9 % IV SOLN: INTRAVENOUS | Status: DC

## 2016-07-17 MED ORDER — MAGNESIUM SULFATE 2 GM/50ML IV SOLN: 2.0000 g | Freq: Every day | INTRAVENOUS | Status: DC | PRN

## 2016-07-17 MED ORDER — PANTOPRAZOLE SODIUM 40 MG PO TBEC
40.0000 mg | DELAYED_RELEASE_TABLET | Freq: Every day | ORAL | Status: DC
Start: 2016-07-17 — End: 2016-07-18
  Administered 2016-07-18: 40 mg via ORAL
  Filled 2016-07-17: qty 1

## 2016-07-17 MED ORDER — LIDOCAINE HCL (PF) 1 % IJ SOLN
INTRAMUSCULAR | Status: AC
  Filled 2016-07-17: qty 30

## 2016-07-17 MED ORDER — ASPIRIN EC 325 MG PO TBEC: 325.0000 mg | DELAYED_RELEASE_TABLET | Freq: Every day | ORAL | Status: DC

## 2016-07-17 MED ORDER — LABETALOL HCL 5 MG/ML IV SOLN: 10.0000 mg | INTRAVENOUS | Status: DC | PRN

## 2016-07-17 MED ORDER — METOPROLOL TARTRATE 5 MG/5ML IV SOLN: 2.0000 mg | INTRAVENOUS | Status: DC | PRN

## 2016-07-17 MED ORDER — SUGAMMADEX SODIUM 200 MG/2ML IV SOLN
INTRAVENOUS | Status: AC
Start: 2016-07-17 — End: 2016-07-17
  Filled 2016-07-17: qty 2

## 2016-07-17 MED ORDER — PHENYLEPHRINE 40 MCG/ML (10ML) SYRINGE FOR IV PUSH (FOR BLOOD PRESSURE SUPPORT)
PREFILLED_SYRINGE | INTRAVENOUS | Status: DC | PRN
  Administered 2016-07-17 (×3): 80 ug via INTRAVENOUS
  Administered 2016-07-17: 40 ug via INTRAVENOUS
  Administered 2016-07-17: 80 ug via INTRAVENOUS

## 2016-07-17 MED ORDER — POTASSIUM CHLORIDE CRYS ER 20 MEQ PO TBCR: 20.0000 meq | EXTENDED_RELEASE_TABLET | Freq: Every day | ORAL | Status: DC | PRN

## 2016-07-17 MED ORDER — PHENYLEPHRINE HCL 10 MG/ML IJ SOLN: INTRAMUSCULAR | Status: DC | PRN

## 2016-07-17 MED ORDER — HEPARIN SODIUM (PORCINE) 1000 UNIT/ML IJ SOLN
INTRAMUSCULAR | Status: DC | PRN
  Administered 2016-07-17: 7 mL via INTRAVENOUS

## 2016-07-17 MED ORDER — LIDOCAINE HCL (CARDIAC) 20 MG/ML IV SOLN
INTRAVENOUS | Status: DC | PRN
  Administered 2016-07-17: 60 mg via INTRATRACHEAL

## 2016-07-17 MED ORDER — DEXTROSE 5 % IV SOLN
1.5000 g | Freq: Two times a day (BID) | INTRAVENOUS | Status: DC
  Administered 2016-07-18: 1.5 g via INTRAVENOUS
  Filled 2016-07-17 (×2): qty 1.5

## 2016-07-17 MED ORDER — THROMBIN 20000 UNITS EX SOLR
CUTANEOUS | Status: AC
  Filled 2016-07-17: qty 20000

## 2016-07-17 SURGICAL SUPPLY — 40 items
ADH SKN CLS APL DERMABOND .7 (GAUZE/BANDAGES/DRESSINGS) ×1
AGENT HMST SPONGE THK3/8 (HEMOSTASIS) ×1
CANISTER SUCTION 2500CC (MISCELLANEOUS) ×2 IMPLANT
CANNULA VESSEL 3MM 2 BLNT TIP (CANNULA) ×4 IMPLANT
CATH ROBINSON RED A/P 18FR (CATHETERS) ×2 IMPLANT
CLIP TI MEDIUM 6 (CLIP) ×2 IMPLANT
CLIP TI WIDE RED SMALL 6 (CLIP) ×2 IMPLANT
CRADLE DONUT ADULT HEAD (MISCELLANEOUS) ×2 IMPLANT
DECANTER SPIKE VIAL GLASS SM (MISCELLANEOUS) IMPLANT
DERMABOND ADVANCED (GAUZE/BANDAGES/DRESSINGS) ×1
DERMABOND ADVANCED .7 DNX12 (GAUZE/BANDAGES/DRESSINGS) ×1 IMPLANT
DRAIN HEMOVAC 1/8 X 5 (WOUND CARE) IMPLANT
ELECT REM PT RETURN 9FT ADLT (ELECTROSURGICAL) ×2
ELECTRODE REM PT RTRN 9FT ADLT (ELECTROSURGICAL) ×1 IMPLANT
EVACUATOR SILICONE 100CC (DRAIN) IMPLANT
GLOVE BIO SURGEON STRL SZ7.5 (GLOVE) ×2 IMPLANT
GOWN STRL REUS W/ TWL LRG LVL3 (GOWN DISPOSABLE) ×3 IMPLANT
GOWN STRL REUS W/TWL LRG LVL3 (GOWN DISPOSABLE) ×6
HEMOSTAT SPONGE AVITENE ULTRA (HEMOSTASIS) ×2 IMPLANT
KIT BASIN OR (CUSTOM PROCEDURE TRAY) ×2 IMPLANT
KIT ROOM TURNOVER OR (KITS) ×2 IMPLANT
LOOP VESSEL MINI RED (MISCELLANEOUS) IMPLANT
NEEDLE HYPO 25GX1X1/2 BEV (NEEDLE) IMPLANT
NS IRRIG 1000ML POUR BTL (IV SOLUTION) ×4 IMPLANT
PACK CAROTID (CUSTOM PROCEDURE TRAY) ×2 IMPLANT
PAD ARMBOARD 7.5X6 YLW CONV (MISCELLANEOUS) ×4 IMPLANT
PATCH HEMASHIELD 8X75 (Vascular Products) ×1 IMPLANT
SHUNT CAROTID BYPASS 10 (VASCULAR PRODUCTS) ×2 IMPLANT
SHUNT CAROTID BYPASS 12FRX15.5 (VASCULAR PRODUCTS) IMPLANT
SUT ETHILON 3 0 PS 1 (SUTURE) IMPLANT
SUT PROLENE 6 0 CC (SUTURE) ×2 IMPLANT
SUT PROLENE 7 0 BV 1 (SUTURE) IMPLANT
SUT PROLENE 7 0 BV1 MDA (SUTURE) ×2 IMPLANT
SUT SILK 3 0 TIES 17X18 (SUTURE)
SUT SILK 3-0 18XBRD TIE BLK (SUTURE) IMPLANT
SUT VIC AB 3-0 SH 27 (SUTURE) ×2
SUT VIC AB 3-0 SH 27X BRD (SUTURE) ×1 IMPLANT
SUT VICRYL 4-0 PS2 18IN ABS (SUTURE) ×2 IMPLANT
SYR CONTROL 10ML LL (SYRINGE) IMPLANT
WATER STERILE IRR 1000ML POUR (IV SOLUTION) ×2 IMPLANT

## 2016-07-17 NOTE — Transfer of Care (Signed)
Immediate Anesthesia Transfer of Care Note  Patient: Aaron Blair  Procedure(s) Performed: Procedure(s): ENDARTERECTOMY CAROTID (Left)  Patient Location: PACU  Anesthesia Type:General  Level of Consciousness: awake, alert  and patient cooperative  Airway & Oxygen Therapy: Patient Spontanous Breathing and Patient connected to face mask oxygen  Post-op Assessment: Report given to RN, Post -op Vital signs reviewed and stable and Patient moving all extremities X 4  Post vital signs: Reviewed and stable  Last Vitals:  Vitals:   07/17/16 0946 07/17/16 1451  BP: (!) 192/89 (!) 157/76  Pulse:  83  Resp:  13  Temp:  36.7 C    Last Pain:  Vitals:   07/17/16 0940  TempSrc: Oral      Patients Stated Pain Goal: 3 (07/17/16 1032)  Complications: No apparent anesthesia complications

## 2016-07-17 NOTE — Interval H&P Note (Signed)
History and Physical Interval Note:  07/17/2016 9:57 AM  Aaron Blair  has presented today for surgery, with the diagnosis of Left carotid stenosis  The various methods of treatment have been discussed with the patient and family. After consideration of risks, benefits and other options for treatment, the patient has consented to  Procedure(s): ENDARTERECTOMY CAROTID (Left) as a surgical intervention .  The patient's history has been reviewed, patient examined, no change in status, stable for surgery.  I have reviewed the patient's chart and labs.  Questions were answered to the patient's satisfaction.     Fabienne BrunsFields, Alejandrina Raimer

## 2016-07-17 NOTE — Anesthesia Procedure Notes (Signed)
Procedure Name: Intubation Date/Time: 07/17/2016 12:25 PM Performed by: Rosiland OzMEYERS, Jasalyn Frysinger Pre-anesthesia Checklist: Patient identified, Emergency Drugs available, Suction available, Patient being monitored and Timeout performed Patient Re-evaluated:Patient Re-evaluated prior to inductionOxygen Delivery Method: Circle system utilized Preoxygenation: Pre-oxygenation with 100% oxygen Intubation Type: IV induction Ventilation: Mask ventilation without difficulty and Oral airway inserted - appropriate to patient size Laryngoscope Size: Miller and 3 Grade View: Grade II Tube type: Oral Tube size: 7.5 mm Number of attempts: 1 Airway Equipment and Method: Stylet Placement Confirmation: ETT inserted through vocal cords under direct vision,  positive ETCO2 and breath sounds checked- equal and bilateral Secured at: 22 cm Tube secured with: Tape Dental Injury: Teeth and Oropharynx as per pre-operative assessment

## 2016-07-17 NOTE — H&P (View-Only) (Signed)
HISTORY AND PHYSICAL     CC:  Left carotid artery stenosis Referring Provider:  Jay Gangi, Md HPI: This is a 82 y.o. male who was referred by Dr. Ganji for left carotid artery stenosis.  He had a carotid duplex by Dr. Ganji on 06/13/16, which revealed critical left carotid artery stenosis.  Patient is Korean and some of the interview today was conducted through his son acting as interpreter.  He states he has not had any amaurosis fugax, hemiparesis or hemiplegia or difficulty with speech.  He has never had a heart attack.  He does have diabetes that he takes oral agents for.  He was on a Crestor for cholesterol management, however, he did have leg cramps and this was discontinued.  At his last visit with Dr. Ganji, he was started on Livalo, which is also a statin.  He is on Zetia.  He takes an ARB and CCB for blood pressure management.  He does take a daily aspirin.    The pt had a stress test earlier this year and was normal with an EF of 76%.    He denies any shortness of breath, chest pain, or claudication.  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    He has a hx of tobacco use, however, he quit 5 years ago b/c his wife wanted him to.  He does not drink alcohol.    His son states he recently had lower back surgery for a herniated disk.  He has never had any surgery on his neck.   Past Medical History:  Diagnosis Date  . Chronic gastritis   . Diabetes (HCC)   . Hypertension   . Lumbar herniated disc    L3-4  . Vertigo     Past Surgical History:  Procedure Laterality Date  . No past surgery    . SPINE SURGERY  02/05/2016   Dr. Nunkuma    No Known Allergies  Current Outpatient Prescriptions  Medication Sig Dispense Refill  . amLODipine (NORVASC) 5 MG tablet Take 5 mg by mouth daily.    . aspirin 81 MG tablet Take 81 mg by mouth daily.    . Camphor-Menthol-Methyl Sal (TIGER BALM MUSCLE RUB) 10-14-13 % CREA Apply  topically.    . ezetimibe (ZETIA) 10 MG tablet     . glimepiride (AMARYL) 2 MG tablet Take 2 mg by mouth daily with breakfast.    . pioglitazone (ACTOS) 30 MG tablet Take 30 mg by mouth daily.    . SitaGLIPtin-MetFORMIN HCl (JANUMET XR) 100-1000 MG TB24 Take by mouth 2 (two) times daily.    . valsartan-hydrochlorothiazide (DIOVAN-HCT) 320-25 MG per tablet Take 1 tablet by mouth daily.     No current facility-administered medications for this visit.     No family history on file.  Social History   Social History  . Marital status: Married    Spouse name: N/A  . Number of children: 3  . Years of education: 16   Occupational History  . Owns commercial cleaning business    Social History Main Topics  . Smoking status: Former Smoker    Quit date: 06/28/2011  . Smokeless tobacco: Never Used     Comment: Quit 3 years ago.  . Alcohol use No  . Drug use: No  . Sexual activity: Not on file   Other Topics Concern  . Not on file   Social History Narrative     Lives at home with his wife and son.   Right-handed.   1 cup caffeine per day.     REVIEW OF SYSTEMS:   [X] denotes positive finding, [ ] denotes negative finding Cardiac  Comments:  Chest pain or chest pressure:    Shortness of breath upon exertion:    Short of breath when lying flat:    Irregular heart rhythm:        Vascular    Pain in calf, thigh, or hip brought on by ambulation:    Pain in feet at night that wakes you up from your sleep:     Blood clot in your veins:    Leg swelling:         Pulmonary    Oxygen at home:    Productive cough:     Wheezing:         Neurologic    Sudden weakness in arms or legs:     Sudden numbness in arms or legs:     Sudden onset of difficulty speaking or slurred speech:    Temporary loss of vision in one eye:     Problems with dizziness:         Gastrointestinal    Blood in stool:     Vomited blood:         Genitourinary    Burning when urinating:     Blood in  urine:        Psychiatric    Major depression:         Hematologic    Bleeding problems:    Problems with blood clotting too easily:        Skin    Rashes or ulcers:        Constitutional    Fever or chills:      PHYSICAL EXAMINATION:  Vitals:   06/27/16 1356 06/27/16 1357  BP: (!) 149/82 (!) 143/83  Pulse: 87   Resp: 18    Body mass index is 27.83 kg/m.  General:  WDWN in NAD; vital signs documented above Gait: Not observed HENT: WNL, normocephalic Pulmonary: normal non-labored breathing , without Rales, rhonchi,  wheezing Cardiac: regular HR, without  Murmurs, rubs or gallops; with bilateral carotid bruits Abdomen: soft, NT, no masses Skin: without rashes Vascular Exam/Pulses:  Right Left  Radial 2+ (normal) 2+ (normal)  Ulnar 2+ (normal) 2+ (normal)  Femoral 2+ (normal) 2+ (normal)  DP 2+ (normal) 2+ (normal)  PT 2+ (normal) 2+ (normal)   Extremities: without ischemic changes, without Gangrene , without cellulitis; without open wounds;  Musculoskeletal: no muscle wasting or atrophy  Neurologic: A&O X 3;  No focal weakness or paresthesias are detected Psychiatric:  The pt has Normal affect.   Non-Invasive Vascular Imaging:   Left Carotid duplex 06/27/16: 80-99% left internal carotid artery stenosis (No significant change from exam on 06/16/16 at [facility])  Carotid Duplex 06/16/16 at [facility]: Left with 80-99% carotid artery stenosis Right carotid artery is within normal limits  Pt meds includes: Statin:  Yes.   Beta Blocker:  No. Aspirin:  Yes.   ACEI:  No. ARB:  Yes.   Other Antiplatelet/Anticoagulant:  No.    ASSESSMENT/PLAN:: 82 y.o. male with asymptomatic critical left carotid artery stenosis   -Dr. Diavion Labrador recommended a left carotid endarterectomy to the pt and explained to him and his son that there is about a 0.5% chance of stroke from the operation and a 5% chance of stroke over the next year   if he does not have  the procedure.  -the son translated to the pt and he wants to talk about this with his family and will call us to schedule surgery if he decides to proceed.     Samantha Rhyne, PA-C [facility] [phone]  Clinic MD:  Pt seen and examined in conjunction with Dr. Felecity Lemaster  History and exam details as above. The patient currently wishes to discuss with his family further before proceeding with left carotid endarterectomy. He will call us in the near future if he wishes to proceed. Otherwise he would need a follow-up carotid duplex scan in 6 months.  Makyia Erxleben, MD [facility] [place] Office: [phone] Pager: [phone]   

## 2016-07-17 NOTE — Op Note (Signed)
Procedure Left carotid endarterectomy  Preoperative diagnosis: High-grade asymptomatic left internal carotid artery stenosis  Postoperative diagnosis: Same  Anesthesia General  Asst.: Tilden FossaLanita Presson, PAC  Operative findings: #1 greater than 80% left internal carotid stenosis                                                             #2 Dacron patch           #3 10 Fr shunt  Operative details: After obtaining informed consent, the patient was taken to the operating room. The patient was placed in a supine position on the operating room table. After induction of general anesthesia and endotracheal intubation, the patient's entire neck and chest were prepped and draped in the usual sterile fashion. An oblique incision was made on the left aspect of the patient's neck anterior to the border the left sternocleidomastoid muscle. The incision was carried into the subcutaneous tissues and through the platysma. The sternocleidomastoid muscle was identified and reflected laterally. The omohyoid muscle was identified and this was divided with cautery. The common carotid artery was then found at the base of the incision this was dissected free circumferentially. It was fairly soft on palpation.  The vagus nerve was identified and protected. Dissection was then carried up to the level carotid bifurcation.   The hyperglossal nerve was well above the primary area of dissection. The internal carotid artery was dissected free circumferentially just below the level of the hypoglossal nerve and it was soft in character at this location and above any palpable disease. A vessel loop was placed around this. Next the external carotid and superior thyroid arteries were dissected free circumferentially and vessel loops were placed around these. The patient was given 7000 units of intravenous heparin.  After 2 minutes of circulation time and raising the mean arterial pressure to 90 mm mercury, the distal internal carotid  artery was controlled with a Kitzmiller clamp. The external carotid and superior thyroid arteries were controlled with vessel loops. The common carotid artery was controlled with a peripheral DeBakey clamp. A longitudinal opening was made in the common carotid artery just below the bifurcation. The arteriotomy was extended distally up into the internal carotid with Potts scissors. There was a large calcified plaque with greater than 80% stenosis in the internal carotid.   A 10 Fr shunt was brought onto the field and fashioned to fit the patient's artery.  This was threaded into the distal internal carotid artery and allowed to backbleed thoroughly.  There was pulsatile backbleeding.  This was then threaded into the common carotid and secured with a Rummel tourniquet.  There was no air at this point and flow was restored to the brain.  Attention was then turned to the common carotid artery once again. A suitable endarterectomy plane was obtained and endarterectomy was begun in the common carotid artery and a good proximal endpoint was obtained. An eversion endarterectomy was performed on the external carotid artery and a good endpoint was obtained. The plaque was then elevated in the internal carotid artery and a nice feathered distal endpoint was also obtained.  The plaque was passed off the table. All loose debris was then removed from the carotid bed and everything was thoroughly irrigated with heparinized saline.  The distal intima was lifting  slightly so this was tacked with several 7 0 prolene sutures.  A Dacron patch was then brought on to the operative field and this was sewn on as a patch angioplasty using a running 6-0 Prolene suture. Prior to completion of the anastomosis the internal carotid artery was thoroughly backbled. This was then controlled again with a fine bulldog clamp.  The common carotid was thoroughly flushed forward. The external carotid was also thoroughly backbled.  The remainder of the  patch was completed and the anastomosis was secured. Flow was then restored first retrograde from the external carotid into the carotid bed then antegrade from the common carotid to the external carotid artery and after approximately 5 cardiac cycles to the internal carotid artery. Doppler was used to evaluate the external/internal and common carotid arteries and these all had good Doppler flow. Hemostasis was obtained with 1 additional repair suture.     The platysma muscle was reapproximated using a running 3-0 Vicryl suture. The skin was closed with 4 0 Vicryl subcuticular stitch.  The patient was awakened in the operating room and was moving upper and lower extremities symmetrically and following commands.  The patient was stable on arrival to the PACU.  Fabienne Brunsharles Mariajose Mow, MD [facility] [place] Office: [phone] Pager: [phone]

## 2016-07-17 NOTE — Progress Notes (Signed)
Pt easy to awake Moves extremities symmetrically No facial assymmetry No neck hematoma  Stable post op  Fabienne Brunsharles Allex Lapoint, MD [facility] [place] Office: [phone] Pager: [phone]

## 2016-07-17 NOTE — Anesthesia Preprocedure Evaluation (Signed)
Anesthesia Evaluation  Patient identified by MRN, date of birth, ID band Patient awake    Reviewed: Allergy & Precautions, NPO status , Patient's Chart, lab work & pertinent test results  Airway Mallampati: III  TM Distance: >3 FB Neck ROM: Full    Dental  (+) Dental Advisory Given   Pulmonary former smoker,    breath sounds clear to auscultation       Cardiovascular hypertension, Pt. on medications + Peripheral Vascular Disease   Rhythm:Regular Rate:Normal     Neuro/Psych negative neurological ROS     GI/Hepatic negative GI ROS, Neg liver ROS,   Endo/Other  diabetes, Type 2, Oral Hypoglycemic Agents  Renal/GU negative Renal ROS     Musculoskeletal   Abdominal   Peds  Hematology negative hematology ROS (+)   Anesthesia Other Findings   Reproductive/Obstetrics                             Lab Results  Component Value Date   WBC 5.5 07/17/2016   HGB 14.1 07/17/2016   HCT 40.9 07/17/2016   MCV 93.0 07/17/2016   PLT 195 07/17/2016   Lab Results  Component Value Date   CREATININE 0.80 07/17/2016   BUN 20 07/17/2016   NA 137 07/17/2016   K 3.7 07/17/2016   CL 103 07/17/2016   CO2 25 07/17/2016    Anesthesia Physical Anesthesia Plan  ASA: III  Anesthesia Plan: General   Post-op Pain Management:    Induction: Intravenous  Airway Management Planned: Oral ETT  Additional Equipment: Arterial line  Intra-op Plan:   Post-operative Plan: Extubation in OR  Informed Consent: I have reviewed the patients History and Physical, chart, labs and discussed the procedure including the risks, benefits and alternatives for the proposed anesthesia with the patient or authorized representative who has indicated his/her understanding and acceptance.   Dental advisory given  Plan Discussed with:   Anesthesia Plan Comments:         Anesthesia Quick Evaluation

## 2016-07-18 ENCOUNTER — Encounter (HOSPITAL_COMMUNITY): Payer: Self-pay | Admitting: Vascular Surgery

## 2016-07-18 LAB — CBC
HCT: 38.1 % — ABNORMAL LOW (ref 39.0–52.0)
Hemoglobin: 12.8 g/dL — ABNORMAL LOW (ref 13.0–17.0)
MCH: 31.2 pg (ref 26.0–34.0)
MCHC: 33.6 g/dL (ref 30.0–36.0)
MCV: 92.9 fL (ref 78.0–100.0)
PLATELETS: 142 10*3/uL — AB (ref 150–400)
RBC: 4.1 MIL/uL — AB (ref 4.22–5.81)
RDW: 12.8 % (ref 11.5–15.5)
WBC: 8 10*3/uL (ref 4.0–10.5)

## 2016-07-18 LAB — BASIC METABOLIC PANEL
ANION GAP: 6 (ref 5–15)
BUN: 14 mg/dL (ref 6–20)
CO2: 27 mmol/L (ref 22–32)
Calcium: 8.5 mg/dL — ABNORMAL LOW (ref 8.9–10.3)
Chloride: 101 mmol/L (ref 101–111)
Creatinine, Ser: 0.7 mg/dL (ref 0.61–1.24)
Glucose, Bld: 199 mg/dL — ABNORMAL HIGH (ref 65–99)
POTASSIUM: 3.7 mmol/L (ref 3.5–5.1)
SODIUM: 134 mmol/L — AB (ref 135–145)

## 2016-07-18 MED ORDER — OXYCODONE-ACETAMINOPHEN 5-325 MG PO TABS: 1.0000 | ORAL_TABLET | Freq: Four times a day (QID) | ORAL | 0 refills | Status: DC | PRN

## 2016-07-18 NOTE — Discharge Summary (Signed)
[facility] Discharge Summary   Patient ID:  Aaron Blair Goral MRN: [id number] DOB/AGE: [birth year] 80 y.o.  Admit date: 07/17/2016 Discharge date: 07/18/2016 Date of Surgery: 07/17/2016 Surgeon: Surgeon(s): Sherren Kernsharles E Fields, MD  Admission Diagnosis: Left carotid stenosis  Discharge Diagnoses:  Left carotid stenosis  Secondary Diagnoses: Past Medical History:  Diagnosis Date  . Chronic gastritis   . Diabetes (HCC)    Type II  . Hypertension   . Lumbar herniated disc    L3-4  . Vertigo     Procedure(s): ENDARTERECTOMY CAROTID  Discharged Condition: good  HPI: This is a 82 y.o. male who was referred by Dr. Jacinto HalimGanji for left carotid artery stenosis.  He had a carotid duplex by Dr. Jacinto HalimGanji on 06/13/16, which revealed critical left carotid artery stenosis.  Patient is [place] and some of the interview today was conducted through his son acting as interpreter.  He states he has not had any amaurosis fugax, hemiparesis or hemiplegia or difficulty with speech.  He has never had a heart attack.  He does have diabetes that he takes oral agents for.  He was on a Crestor for cholesterol management, however, he did have leg cramps and this was discontinued.  At his last visit with Dr. Jacinto HalimGanji, he was started on Livalo, which is also a statin.  He is on Zetia.  He takes an ARB and CCB for blood pressure management.  He does take a daily aspirin.    The pt had a stress test earlier this year and was normal with an EF of 76%.    He denies any shortness of breath, chest pain, or claudication.  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    He has a hx of tobacco use, however, he quit 5 years ago Blair/c his wife wanted him to.  He does not drink alcohol.    His son states he recently had lower back surgery for a herniated disk.  He has never had any surgery on his neck.     Hospital Course:  Aaron Blair Sahagun is a 82 y.o.  male is S/P Left Procedure(s): ENDARTERECTOMY CAROTID Pt easy to awake Moves extremities symmetrically No facial assymmetry No neck hematoma  Stable post op Left neck no hematoma UE/LE 5/5 motor, no facial asymmetry   Assessment/Planning: Mild left side headache doubt reperfusion injury with no significant contralateral stenosis  Swallow ok Needs to get out of bed and ambulate Plan for d/c this morning Fu 2-3 weeks      Significant Diagnostic Studies: CBC Lab Results  Component Value Date   WBC 8.0 07/18/2016   HGB 12.8 (L) 07/18/2016   HCT 38.1 (L) 07/18/2016   MCV 92.9 07/18/2016   PLT 142 (L) 07/18/2016    BMET    Component Value Date/Time   NA 134 (L) 07/18/2016 0418   K 3.7 07/18/2016 0418   CL 101 07/18/2016 0418   CO2 27 07/18/2016 0418   GLUCOSE 199 (H) 07/18/2016 0418   BUN 14 07/18/2016 0418   CREATININE 0.70 07/18/2016 0418   CALCIUM 8.5 (L) 07/18/2016 0418   GFRNONAA >60 07/18/2016 0418   GFRAA >60 07/18/2016 0418   COAG Lab Results  Component Value Date   INR 0.96 07/17/2016     Disposition:  Discharge to :Home Discharge Instructions    Call MD for:  redness, tenderness, or signs of infection (pain, swelling, bleeding,  redness, odor or green/yellow discharge around incision site)    Complete by:  As directed    Call MD for:  severe or increased pain, loss or decreased feeling  in affected limb(s)    Complete by:  As directed    Call MD for:  temperature >100.5    Complete by:  As directed    Discharge instructions    Complete by:  As directed    You may shower in 24 hours   Driving Restrictions    Complete by:  As directed    No driving for 1 week   Increase activity slowly    Complete by:  As directed    Walk with assistance use walker or cane as needed   Lifting restrictions    Complete by:  As directed    No heavy  lifting for 4 weeks   Resume previous diet    Complete by:  As directed        Medication List     TAKE these medications   amLODipine 5 MG tablet Commonly known as:  NORVASC Take 5 mg by mouth daily.   aspirin 81 MG tablet Take 81 mg by mouth daily.   glimepiride 2 MG tablet Commonly known as:  AMARYL Take 2 mg by mouth daily with breakfast.   JANUMET XR [phone] MG Tb24 Generic drug:  SitaGLIPtin-MetFORMIN HCl Take 1 tablet by mouth 2 (two) times daily.   LIVALO 2 MG Tabs Generic drug:  Pitavastatin Calcium Take 2 mg by mouth daily.   oxyCODONE-acetaminophen 5-325 MG tablet Commonly known as:  PERCOCET/ROXICET Take 1 tablet by mouth every 6 (six) hours as needed for moderate pain.   pioglitazone 30 MG tablet Commonly known as:  ACTOS Take 30 mg by mouth daily.   TIGER BALM MUSCLE RUB 10-14-13 % Crea Generic drug:  Camphor-Menthol-Methyl Sal Apply 1 application topically daily as needed (pain/aches).   valsartan-hydrochlorothiazide 320-25 MG tablet Commonly known as:  DIOVAN-HCT Take 1 tablet by mouth daily.      Verbal and written Discharge instructions given to the patient. Wound care per Discharge AVS Follow-up Information    Fabienne BrunsFields, Charles, MD Follow up in 2 week(s).   Specialties:  Vascular Surgery, Cardiology Why:  office will call with appt. Contact information: [address] [place] [address] [phone]           Signed: Clinton GallantCOLLINS, Javaughn Opdahl [facility] 07/18/2016, 9:56 AM --- For VQI Registry use --- Instructions: Press F2 to tab through selections.  Delete question if not applicable.   Modified Rankin score at D/C (0-6): Rankin Score=0  IV medication needed for:  1. Hypertension: No 2. Hypotension: No  Post-op Complications: No  1. Post-op CVA or TIA: No  If yes: Event classification (right eye, left eye, right cortical, left cortical, verterobasilar, other):   If yes: Timing of event (intra-op, <6 hrs post-op, >=6 hrs post-op, unknown):   2. CN injury: No  If yes: CN  injuried   3. Myocardial infarction: No  If yes: Dx by (EKG or  clinical, Troponin):   4.  CHF: No  5.  Dysrhythmia (new): No  6. Wound infection: No  7. Reperfusion symptoms: No  8. Return to OR: No  If yes: return to OR for (bleeding, neurologic, other CEA incision, other):   Discharge medications: Statin use:  No  for medical reason   ASA use:  Yes Beta blocker use:  No  for medical reason   ACE-Inhibitor use:  Yes P2Y12 Antagonist use: [x ] None, [ ]  Plavix, [ ]  Plasugrel, [ ]  Ticlopinine, [ ]  Ticagrelor, [ ]  Other, [ ]  No for medical reason, [ ]  Non-compliant, [ ]  Not-indicated Anti-coagulant use:  [x ] None, [ ]  Warfarin, [ ]  Rivaroxaban, [ ]  Dabigatran, [ ]  Other, [ ]  No for medical reason, [ ]  Non-compliant, [ ]  Not-indicated

## 2016-07-18 NOTE — Progress Notes (Addendum)
Patient to be discharged to home with spouse. PIV removed. CCMD and Elink notified of patient's discharge. Discharge instructions reviewed with patient, including post op care of surgical incision, s/s surgical site infection, s/s stroke, when to call 911 vs when to call your MD, and how to use incentive spirometer. Handouts given on CEA and ischemic stroke and reviewed with patient. Prescription for pain meds sent with patient. Instructed patient to take all medications as prescribed and to follow up with MD as ordered. Answered all patient's questions.   Patient had a family friend at bedside who stated she was a Engineer, civil (consulting)nurse and pt stated he preferred her to translate, instead of using a hospital interpreter, despite RN educating him on the importance of using a hospital interpreter.   Leanna BattlesEckelmann, Jahi Roza Eileen, RN.

## 2016-07-18 NOTE — Care Management Note (Signed)
Case Management Note  Patient Details  Name: Aaron Blair MRN: [id number] Date of Birth: [birth year]  Subjective/Objective:     S/p  Left CEA, pta indep, no needs.              Action/Plan:   Expected Discharge Date:                  Expected Discharge Plan:  Home/Self Care  In-House Referral:     Discharge planning Services  CM Consult  Post Acute Care Choice:    Choice offered to:     DME Arranged:    DME Agency:     HH Arranged:    HH Agency:     Status of Service:  Completed, signed off  If discussed at [employer] Length of Stay Meetings, dates discussed:    Additional Comments:  Leone Havenaylor, Takeesha Isley Clinton, RN 07/18/2016, 10:58 AM

## 2016-07-18 NOTE — Anesthesia Postprocedure Evaluation (Signed)
Anesthesia Post Note  Patient: Aaron Blair  Procedure(s) Performed: Procedure(s) (LRB): ENDARTERECTOMY CAROTID (Left)  Patient location during evaluation: PACU Anesthesia Type: General Level of consciousness: sedated Pain management: satisfactory to patient Vital Signs Assessment: post-procedure vital signs reviewed and stable Respiratory status: spontaneous breathing Cardiovascular status: stable Anesthetic complications: no Comments: Moves all extremities, responsive     Last Vitals:  Vitals:   07/18/16 0300 07/18/16 0410  BP: (!) 141/78 138/76  Pulse: 80 80  Resp: (!) 9 11  Temp:  36.6 C    Last Pain:  Vitals:   07/18/16 0410  TempSrc: Oral  PainSc:    Pain Goal: Patients Stated Pain Goal: 3 (07/17/16 1032)               Lacresha Fusilier EDWARD

## 2016-07-18 NOTE — Progress Notes (Signed)
[facility]  Subjective  - feels ok, mild left side headache   Objective 138/76 80 97.9 F (36.6 C) (Oral) 11 95%  Intake/Output Summary (Last 24 hours) at 07/18/16 16100635 Last data filed at 07/18/16 0600  Gross per 24 hour  Intake             2525 ml  Output             1720 ml  Net              805 ml   Left neck no hematoma UE/LE 5/5 motor, no facial asymmetry   Assessment/Planning: Mild left side headache doubt reperfusion injury with no significant contralateral stenosis  Swallow ok Needs to get out of bed and ambulate Plan for d/c this morning Fu 2-3 weeks  Aaron Blair, Aaron Blair 07/18/2016 6:35 AM --  Laboratory Lab Results:  Recent Labs  07/17/16 1037 07/18/16 0418  WBC 5.5 8.0  HGB 14.1 12.8*  HCT 40.9 38.1*  PLT 195 142*   BMET  Recent Labs  07/17/16 1037 07/18/16 0418  NA 137 134*  K 3.7 3.7  CL 103 101  CO2 25 27  GLUCOSE 163* 199*  BUN 20 14  CREATININE 0.80 0.70  CALCIUM 9.3 8.5*    COAG Lab Results  Component Value Date   INR 0.96 07/17/2016   No results found for: PTT

## 2016-07-19 ENCOUNTER — Encounter: Payer: Self-pay | Admitting: *Deleted

## 2016-07-19 ENCOUNTER — Telehealth: Payer: Self-pay | Admitting: *Deleted

## 2016-07-19 NOTE — Congregational Nurse Program (Signed)
Congregational Nurse Program Note  Date of Encounter: 07/18/2016 Past Medical History: Past Medical History:  Diagnosis Date  . Chronic gastritis   . Diabetes (HCC)    Type II  . Hypertension   . Lumbar herniated disc    L3-4  . Vertigo     Encounter Details:     CNP Questionnaire - 07/18/16 1300      Patient Demographics   Is this a new or existing patient? Existing   Patient is considered a/an Immigrant   Race Asian     Patient Assistance   Location of Patient Assistance Not Applicable  [place]   Patient's financial/insurance status Medicare   Uninsured Patient ([place] Card/Care Connects) No   Patient referred to apply for the following financial assistance Not Applicable   Food insecurities addressed Not Applicable   Transportation assistance No   Assistance securing medications No   Educational health offerings Safety;Spiritual care;Other  discharge instruction     Encounter Details   Primary purpose of visit Family/Caregiver Support;Post ED/Hospitalization Visit  s/p endartarectomy Lt   Was an Emergency Department visit averted? Not Applicable   Does patient have a medical provider? Yes   Patient referred to Not Applicable   Was a mental health screening completed? (GAINS tool) No   Does patient have dental issues? No   Does patient have vision issues? No   Does your patient have an abnormal blood pressure today? No   Since previous encounter, have you referred patient for abnormal blood pressure that resulted in a new diagnosis or medication change? No   Does your patient have an abnormal blood glucose today? No   Since previous encounter, have you referred patient for abnormal blood glucose that resulted in a new diagnosis or medication change? No   Was there a life-saving intervention made? No     s/p Lt Carotid endartarectomy 07/17/2016 At [place]. Visit 3S for discharge, waited wife from work, discharge instruction given by RN, interpretated  to pt and wife. Dc home with wife. Will f/u.

## 2016-07-22 ENCOUNTER — Encounter: Payer: Self-pay | Admitting: *Deleted

## 2016-07-22 NOTE — Congregational Nurse Program (Signed)
Congregational Nurse Program Note  Date of Encounter: 07/21/2016 Past Medical History: Past Medical History:  Diagnosis Date  . Chronic gastritis   . Diabetes (HCC)    Type II  . Hypertension   . Lumbar herniated disc    L3-4  . Vertigo     Encounter Details:     CNP Questionnaire - 07/18/16 1300      Patient Demographics   Is this a new or existing patient? Existing   Patient is considered a/an Immigrant   Race Asian     Patient Assistance   Location of Patient Assistance Not Applicable  [place]   Patient's financial/insurance status Medicare   Uninsured Patient ([place] Card/Care Connects) No   Patient referred to apply for the following financial assistance Not Applicable   Food insecurities addressed Not Applicable   Transportation assistance No   Assistance securing medications No   Educational health offerings Safety;Spiritual care;Other  discharge instruction     Encounter Details   Primary purpose of visit Family/Caregiver Support;Post ED/Hospitalization Visit  s/p endartarectomy Lt   Was an Emergency Department visit averted? Not Applicable   Does patient have a medical provider? Yes   Patient referred to Not Applicable   Was a mental health screening completed? (GAINS tool) No   Does patient have dental issues? No   Does patient have vision issues? No   Does your patient have an abnormal blood pressure today? No   Since previous encounter, have you referred patient for abnormal blood pressure that resulted in a new diagnosis or medication change? No   Does your patient have an abnormal blood glucose today? No   Since previous encounter, have you referred patient for abnormal blood glucose that resulted in a new diagnosis or medication change? No   Was there a life-saving intervention made? No     f/u with pt's wife, said had mild fever and pain @ neck OP site. Advised to applied ice pack and monitor stroke sx. Will f/u.

## 2016-07-25 ENCOUNTER — Encounter: Payer: Self-pay | Admitting: Vascular Surgery

## 2016-07-25 ENCOUNTER — Telehealth: Payer: Self-pay

## 2016-07-25 NOTE — Telephone Encounter (Signed)
appt made for tomorrow with NP at 11:15

## 2016-07-25 NOTE — Telephone Encounter (Signed)
Pt's son called office and reported the left neck incision appears infected.  Stated there is blister on the incision with redness.  Reported an intermittent clear drainage.  Denied any increased pain.  Denied fever/ chills.  Appt. Given for incision check 07/26/16 @ 11:15 with NP.  Son agreed.

## 2016-07-26 ENCOUNTER — Ambulatory Visit (INDEPENDENT_AMBULATORY_CARE_PROVIDER_SITE_OTHER): Payer: Medicare Other | Admitting: Family

## 2016-07-26 ENCOUNTER — Encounter: Payer: Self-pay | Admitting: Family

## 2016-07-26 ENCOUNTER — Ambulatory Visit: Payer: Medicare Other | Admitting: Family

## 2016-07-26 VITALS — BP 112/63 | HR 105 | Temp 97.6°F | Resp 18 | Wt 151.0 lb

## 2016-07-26 DIAGNOSIS — Z9889 Other specified postprocedural states: Secondary | ICD-10-CM

## 2016-07-26 DIAGNOSIS — I6522 Occlusion and stenosis of left carotid artery: Secondary | ICD-10-CM

## 2016-07-26 NOTE — Patient Instructions (Signed)
Preventing Cerebrovascular Disease Arteries are blood vessels that carry blood that contains oxygen from the heart to all parts of the body. Cerebrovascular disease affects arteries that supply the brain. Any condition that blocks or disrupts blood flow to the brain can cause cerebrovascular disease. Brain cells that lose blood supply start to die within minutes (stroke). Stroke is the main danger of cerebrovascular disease. Atherosclerosis and high blood pressure are common causes of cerebrovascular disease. Atherosclerosis is narrowing and hardening of an artery that results when fat, cholesterol, calcium, or other substances (plaque) build up inside an artery. Plaque reduces blood flow through the artery. High blood pressure increases the risk of bleeding inside the brain. Making diet and lifestyle changes to prevent atherosclerosis and high blood pressure lowers your risk of cerebrovascular disease. What nutrition changes can be made?  Eat more fruits, vegetables, and whole grains.  Reduce how much saturated fat you eat. To do this, eat less red meat and fewer full-fat dairy products.  Eat healthy proteins instead of red meat. Healthy proteins include:  Fish. Eat fish that contains heart-healthy omega-3 fatty acids, twice a week. Examples include salmon, albacore tuna, mackerel, and herring.  Chicken.  Nuts.  Low-fat or nonfat yogurt.  Avoid processed meats, like bacon and lunchmeat.  Avoid foods that contain:  A lot of sugar, such as sweets and drinks with added sugar.  A lot of salt (sodium). Avoid adding extra salt to your food, as told by your health care provider.  Trans fats, such as margarine and baked goods. Trans fats may be listed as "partially hydrogenated oils" on food labels.  Check food labels to see how much sodium, sugar, and trans fats are in foods.  Use vegetable oils that contain low amounts of saturated fat, such as olive oil or canola oil. What lifestyle  changes can be made?  Drink alcohol in moderation. This means no more than 1 drink a day for nonpregnant women and 2 drinks a day for men. One drink equals 12 oz of beer, 5 oz of wine, or 1 oz of hard liquor.  If you are overweight, ask your health care provider to recommend a weight-loss plan for you. Losing 5-10 lb (2.2-4.5 kg) can reduce your risk of diabetes, atherosclerosis, and high blood pressure.  Exercise for 30?60 minutes on most days, or as much as told by your health care provider.  Do moderate-intensity exercise, such as brisk walking, bicycling, and water aerobics. Ask your health care provider which activities are safe for you.  Do not use any products that contain nicotine or tobacco, such as cigarettes and e-cigarettes. If you need help quitting, ask your health care provider. Why are these changes important? Making these changes lowers your risk of many diseases that can cause cerebrovascular disease and stroke. Stroke is a leading cause of death and disability. Making these changes also improves your overall health and quality of life. What can I do to lower my risk? The following factors make you more likely to develop cerebrovascular disease:  Being overweight.  Smoking.  Being physically inactive.  Eating a high-fat diet.  Having certain health conditions, such as:  Diabetes.  High blood pressure.  Heart disease.  Atherosclerosis.  High cholesterol.  Sickle cell disease. Talk with your health care provider about your risk for cerebrovascular disease. Work with your health care provider to control diseases that you have that may contribute to cerebrovascular disease. Your health care provider may prescribe medicines to help prevent major   causes of cerebrovascular disease. Where to find more information: Learn more about preventing cerebrovascular disease from:  National Heart, Lung, and Blood Institute:  www.nhlbi.nih.gov/health/health-topics/topics/stroke  Centers for Disease Control and Prevention: cdc.gov/stroke/about.htm Summary  Cerebrovascular disease can lead to a stroke.  Atherosclerosis and high blood pressure are major causes of cerebrovascular disease.  Making diet and lifestyle changes can reduce your risk of cerebrovascular disease.  Work with your health care provider to get your risk factors under control to reduce your risk of cerebrovascular disease. This information is not intended to replace advice given to you by your health care provider. Make sure you discuss any questions you have with your health care provider. Document Released: 08/13/2015 Document Revised: 02/16/2016 Document Reviewed: 08/13/2015 Elsevier Interactive Patient Education  2017 Elsevier Inc.  

## 2016-07-26 NOTE — Progress Notes (Signed)
Postoperative Visit   History of Present Illness  Aaron Blair is a 82 y.o. male who is s/p left carotid endarterectomy on 07-17-16 by Dr. Darrick PennaFields.  He returns today after pt's son called office and reported the left neck incision appears infected.  Stated there is blister on the incision with redness.  Reported an intermittent clear drainage.  Denies any increased pain.  Denies fever/ chills.  Pt's son is on pt's phone and is interpreting, pt is [place].   The patient's neck incision is healing with a moderate amount swelling under the incision that sometime happens after a CEA, pt does not seem to have trouble swallowing, he has no dyspnea.  There is no drainage from the incision, no signs of infection.The patient has had no stroke or TIA symptoms.  For VQI Use Only  PRE-ADM LIVING: Home  AMB STATUS: Ambulatory   Active Ambulatory Problems    Diagnosis Date Noted  . Left carotid stenosis 07/17/2016   Resolved Ambulatory Problems    Diagnosis Date Noted  . No Resolved Ambulatory Problems   Past Medical History:  Diagnosis Date  . Chronic gastritis   . Diabetes (HCC)   . Hypertension   . Lumbar herniated disc   . Vertigo     Social History   Social History  . Marital status: Married    Spouse name: N/A  . Number of children: 3  . Years of education: 7016   Occupational History  . 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 Smoking status: Former Smoker    Years: 60.00    Quit date: 06/28/2011  . Smokeless tobacco: Never Used     Comment: Quit 3 years ago.  . Alcohol use No  . Drug use: No  . Sexual activity: Not on file   Other Topics Concern  . Not on file   Social History Narrative   Lives at home with his wife and son.   Right-handed.   1 cup caffeine per day.    Current Outpatient Prescriptions on File Prior to Visit  Medication Sig Dispense Refill  . amLODipine (NORVASC) 5 MG tablet Take 5 mg by mouth daily.    Marland Kitchen.  aspirin 81 MG tablet Take 81 mg by mouth daily.    . Camphor-Menthol-Methyl Sal (TIGER BALM MUSCLE RUB) 10-14-13 % CREA Apply 1 application topically daily as needed (pain/aches).     Marland Kitchen. glimepiride (AMARYL) 2 MG tablet Take 2 mg by mouth daily with breakfast.    . LIVALO 2 MG TABS Take 2 mg by mouth daily.    Marland Kitchen. oxyCODONE-acetaminophen (PERCOCET/ROXICET) 5-325 MG tablet Take 1 tablet by mouth every 6 (six) hours as needed for moderate pain. 6 tablet 0  . pioglitazone (ACTOS) 30 MG tablet Take 30 mg by mouth daily.    . SitaGLIPtin-MetFORMIN HCl (JANUMET XR) [phone] MG TB24 Take 1 tablet by mouth 2 (two) times daily.     . valsartan-hydrochlorothiazide (DIOVAN-HCT) 320-25 MG per tablet Take 1 tablet by mouth daily.     No current facility-administered medications on file prior to visit.     Physical Examination  Vitals:   07/26/16 0840  BP: 112/63  Pulse: (!) 105  Resp: 18  Temp: 97.6 F (36.4 C)  SpO2: 93%  Weight: 151 lb (68.5 kg)   Body mass index is 25.92 kg/m.  Left Neck: Incision is healing. Neuro: CN 2-12 are intact, Motor strength is 5/5 bilaterally, sensation is  grossly intact  Medical Decision Making  Aaron Blair is a 82 y.o. male who presents s/p  left carotid endarterectomy on 07-17-16.  Dr. Imogene Burnhen examined pt.  The moderate amount of swelling is a hematoma under the left CEA incision which should be absorbed and resolve. Follow up as already scheduled on 08-01-16 with Dr. Darrick PennaFields.    The patient's neck incision is healing, no stroke symptoms. I discussed in depth with the patient the nature of atherosclerosis, and emphasized the importance of maximal medical management including strict control of blood pressure, blood glucose, and lipid levels, obtaining regular exercise, anti-platelet use and cessation of smoking.   The patient is currently on an antiplatelet: ASA. The patient is currently on a statin: Livalo.  The patient is aware that without maximal medical  management the underlying atherosclerotic disease process will progress, limiting the benefit of any interventions.  Thank you for allowing us to participate in this patient's care.  Zekiah Caruth, Carma LairSUZANNE L, RN, MSN, FNP-C [facility] Office: [phone]  07/26/2016, 8:49 AM  Clinic MD: Imogene Burnhen

## 2016-08-01 ENCOUNTER — Encounter: Payer: Self-pay | Admitting: Vascular Surgery

## 2016-08-01 ENCOUNTER — Ambulatory Visit (INDEPENDENT_AMBULATORY_CARE_PROVIDER_SITE_OTHER): Payer: Self-pay | Admitting: Vascular Surgery

## 2016-08-01 ENCOUNTER — Encounter: Payer: Medicare Other | Admitting: Vascular Surgery

## 2016-08-01 VITALS — BP 132/79 | HR 85 | Temp 97.2°F | Resp 18 | Ht 64.0 in | Wt 151.5 lb

## 2016-08-01 DIAGNOSIS — I6522 Occlusion and stenosis of left carotid artery: Secondary | ICD-10-CM

## 2016-08-01 MED ORDER — CEPHALEXIN 500 MG PO CAPS: 500.0000 mg | ORAL_CAPSULE | Freq: Two times a day (BID) | ORAL | 0 refills | Status: DC

## 2016-08-01 NOTE — Progress Notes (Signed)
[facility] HISTORY AND PHYSICAL    History of Present Illness:  Patient is a 82 y.o. year old male who presents for post-operative follow-up after left carotid endarterectomy.  Denies headaches, numbness, tingling or other neuro deficits.  No swallowing problems.  He has had some itching and skin irritation with rash on the left side of his neck. He has had a small amount of clear drainage. He denies any fever or chills.  Physical Examination   Vitals:   08/01/16 1051 08/01/16 1052  BP: 133/75 132/79  Pulse: 85   Resp: 18   Temp: 97.2 F (36.2 C)   TempSrc: Oral   SpO2: 96%   Weight: 151 lb 8 oz (68.7 kg)   Height: 5\' 4"  (1.626 m)     General:  Alert and oriented, no acute distress Neck: No bruit or JVD, mild erythema around the incision with some edema in the left neck no focal hematoma I opened the superior aspect of the incision slightly with drainage of a small amount of clear fluid. Skin: Maculopapular rash surrounding left neck Neurologic: Upper and lower extremity motor 5/5 and symmetric no facial asymmetry  ASSESSMENT: Doing well status post left carotid endarterectomy however has some irritation around his incision which seems to primarily be related to suture material however could represent deeper infection.   PLAN: We will place him on Keflex 500 mg twice a day today for 2 weeks. He will see me back in follow-up after that. He may need further evaluation if it has not cleared by that point.   Fabienne Brunsharles Fields, MD [facility] Office: [phone] Pager: [phone]

## 2016-08-02 ENCOUNTER — Encounter: Payer: Self-pay | Admitting: Vascular Surgery

## 2016-08-07 ENCOUNTER — Ambulatory Visit: Payer: Medicare Other

## 2016-08-08 DIAGNOSIS — E119 Type 2 diabetes mellitus without complications: Secondary | ICD-10-CM | POA: Diagnosis not present

## 2016-08-08 DIAGNOSIS — I10 Essential (primary) hypertension: Secondary | ICD-10-CM | POA: Diagnosis not present

## 2016-08-08 DIAGNOSIS — E785 Hyperlipidemia, unspecified: Secondary | ICD-10-CM | POA: Diagnosis not present

## 2016-08-09 ENCOUNTER — Telehealth: Payer: Self-pay

## 2016-08-09 NOTE — Telephone Encounter (Signed)
Spoke with Aaron RouxJae Rowlands to reschedule appt d/t needed a different appt time.

## 2016-08-15 ENCOUNTER — Ambulatory Visit: Payer: Medicare Other | Admitting: Vascular Surgery

## 2016-08-22 DIAGNOSIS — E119 Type 2 diabetes mellitus without complications: Secondary | ICD-10-CM | POA: Diagnosis not present

## 2016-08-22 DIAGNOSIS — E785 Hyperlipidemia, unspecified: Secondary | ICD-10-CM | POA: Diagnosis not present

## 2016-09-24 DIAGNOSIS — E1165 Type 2 diabetes mellitus with hyperglycemia: Secondary | ICD-10-CM | POA: Diagnosis not present

## 2016-09-24 DIAGNOSIS — R413 Other amnesia: Secondary | ICD-10-CM | POA: Diagnosis not present

## 2016-09-24 DIAGNOSIS — I10 Essential (primary) hypertension: Secondary | ICD-10-CM | POA: Diagnosis not present

## 2016-09-24 DIAGNOSIS — E7800 Pure hypercholesterolemia, unspecified: Secondary | ICD-10-CM | POA: Diagnosis not present

## 2016-10-03 ENCOUNTER — Ambulatory Visit: Payer: Medicare Other | Admitting: Vascular Surgery

## 2016-10-10 ENCOUNTER — Encounter: Payer: Self-pay | Admitting: Vascular Surgery

## 2016-10-17 ENCOUNTER — Ambulatory Visit (INDEPENDENT_AMBULATORY_CARE_PROVIDER_SITE_OTHER): Payer: Medicare Other | Admitting: Vascular Surgery

## 2016-10-17 ENCOUNTER — Encounter: Payer: Self-pay | Admitting: Vascular Surgery

## 2016-10-17 VITALS — BP 148/86 | HR 78 | Temp 98.5°F | Resp 16 | Ht 64.0 in | Wt 154.0 lb

## 2016-10-17 DIAGNOSIS — I6522 Occlusion and stenosis of left carotid artery: Secondary | ICD-10-CM | POA: Diagnosis not present

## 2016-10-17 NOTE — Progress Notes (Signed)
[facility] HISTORY AND PHYSICAL      History of Present Illness:  Patient is a 83 y.o. year old male who presents for follow-up after left carotid endarterectomy 07/16/16.  Denies headaches, numbness, tingling or other neuro deficits.  No swallowing problems.   He had a small amount of clear drainage but this has now resolved. He denies any fever or chills.   Physical Examination     Vitals:   10/17/16 1536 10/17/16 1538  BP: (!) 144/82 (!) 148/86  Pulse: 78   Resp: 16   Temp: 98.5 F (36.9 C)   TempSrc: Oral   SpO2: 93%   Weight: 154 lb (69.9 kg)   Height: 5\' 4"  (1.626 m)      General:  Alert and oriented, no acute distress Neck: No bruit or JVD, Well-healed neck incision  Neurologic: Upper and lower extremity motor 5/5 and symmetric no facial asymmetry   ASSESSMENT: Doing well status post left carotid endarterectomy     PLAN:  patient will follow-up in June 2018 with bilateral carotid duplex which will be 6 months out from his operation.     Fabienne Brunsharles Sydnee Lamour, MD [facility] [place] Office: [phone] Pager: [phone]

## 2016-12-10 DIAGNOSIS — J019 Acute sinusitis, unspecified: Secondary | ICD-10-CM | POA: Diagnosis not present

## 2016-12-11 DIAGNOSIS — R509 Fever, unspecified: Secondary | ICD-10-CM | POA: Diagnosis not present

## 2016-12-11 DIAGNOSIS — R05 Cough: Secondary | ICD-10-CM | POA: Diagnosis not present

## 2016-12-12 ENCOUNTER — Encounter: Payer: Self-pay | Admitting: *Deleted

## 2016-12-12 ENCOUNTER — Telehealth: Payer: Self-pay | Admitting: *Deleted

## 2016-12-12 DIAGNOSIS — E785 Hyperlipidemia, unspecified: Secondary | ICD-10-CM

## 2016-12-12 DIAGNOSIS — E119 Type 2 diabetes mellitus without complications: Secondary | ICD-10-CM

## 2016-12-12 DIAGNOSIS — R3129 Other microscopic hematuria: Secondary | ICD-10-CM | POA: Diagnosis not present

## 2016-12-12 DIAGNOSIS — I10 Essential (primary) hypertension: Secondary | ICD-10-CM

## 2016-12-12 DIAGNOSIS — E1165 Type 2 diabetes mellitus with hyperglycemia: Secondary | ICD-10-CM | POA: Insufficient documentation

## 2016-12-12 DIAGNOSIS — I152 Hypertension secondary to endocrine disorders: Secondary | ICD-10-CM | POA: Insufficient documentation

## 2016-12-12 DIAGNOSIS — E1159 Type 2 diabetes mellitus with other circulatory complications: Secondary | ICD-10-CM | POA: Insufficient documentation

## 2016-12-12 DIAGNOSIS — R066 Hiccough: Secondary | ICD-10-CM | POA: Diagnosis not present

## 2016-12-12 DIAGNOSIS — E1169 Type 2 diabetes mellitus with other specified complication: Secondary | ICD-10-CM | POA: Insufficient documentation

## 2016-12-12 DIAGNOSIS — R509 Fever, unspecified: Secondary | ICD-10-CM | POA: Diagnosis not present

## 2016-12-12 DIAGNOSIS — R739 Hyperglycemia, unspecified: Secondary | ICD-10-CM | POA: Diagnosis not present

## 2016-12-12 DIAGNOSIS — R81 Glycosuria: Secondary | ICD-10-CM | POA: Diagnosis not present

## 2016-12-15 ENCOUNTER — Inpatient Hospital Stay (HOSPITAL_BASED_OUTPATIENT_CLINIC_OR_DEPARTMENT_OTHER)
Admission: EM | Admit: 2016-12-15 | Discharge: 2016-12-17 | DRG: 871 | Disposition: A | Discharge status: Home / Self Care | Payer: Medicare Other | Attending: Internal Medicine | Admitting: Internal Medicine

## 2016-12-15 ENCOUNTER — Emergency Department (HOSPITAL_BASED_OUTPATIENT_CLINIC_OR_DEPARTMENT_OTHER): Payer: Medicare Other

## 2016-12-15 ENCOUNTER — Telehealth: Payer: Self-pay | Admitting: *Deleted

## 2016-12-15 ENCOUNTER — Encounter (HOSPITAL_BASED_OUTPATIENT_CLINIC_OR_DEPARTMENT_OTHER): Payer: Self-pay | Admitting: Emergency Medicine

## 2016-12-15 ENCOUNTER — Encounter: Payer: Self-pay | Admitting: *Deleted

## 2016-12-15 DIAGNOSIS — Z79899 Other long term (current) drug therapy: Secondary | ICD-10-CM

## 2016-12-15 DIAGNOSIS — R0902 Hypoxemia: Secondary | ICD-10-CM

## 2016-12-15 DIAGNOSIS — K2950 Unspecified chronic gastritis without bleeding: Secondary | ICD-10-CM | POA: Diagnosis not present

## 2016-12-15 DIAGNOSIS — G9341 Metabolic encephalopathy: Secondary | ICD-10-CM | POA: Diagnosis not present

## 2016-12-15 DIAGNOSIS — A419 Sepsis, unspecified organism: Principal | ICD-10-CM | POA: Diagnosis present

## 2016-12-15 DIAGNOSIS — E871 Hypo-osmolality and hyponatremia: Secondary | ICD-10-CM | POA: Diagnosis not present

## 2016-12-15 DIAGNOSIS — I10 Essential (primary) hypertension: Secondary | ICD-10-CM | POA: Diagnosis present

## 2016-12-15 DIAGNOSIS — J181 Lobar pneumonia, unspecified organism: Secondary | ICD-10-CM | POA: Diagnosis present

## 2016-12-15 DIAGNOSIS — Z79891 Long term (current) use of opiate analgesic: Secondary | ICD-10-CM | POA: Diagnosis not present

## 2016-12-15 DIAGNOSIS — M5126 Other intervertebral disc displacement, lumbar region: Secondary | ICD-10-CM | POA: Diagnosis not present

## 2016-12-15 DIAGNOSIS — E860 Dehydration: Secondary | ICD-10-CM | POA: Diagnosis present

## 2016-12-15 DIAGNOSIS — Z7984 Long term (current) use of oral hypoglycemic drugs: Secondary | ICD-10-CM

## 2016-12-15 DIAGNOSIS — Z87891 Personal history of nicotine dependence: Secondary | ICD-10-CM | POA: Diagnosis not present

## 2016-12-15 DIAGNOSIS — J9601 Acute respiratory failure with hypoxia: Secondary | ICD-10-CM | POA: Diagnosis present

## 2016-12-15 DIAGNOSIS — Z0389 Encounter for observation for other suspected diseases and conditions ruled out: Secondary | ICD-10-CM | POA: Diagnosis not present

## 2016-12-15 DIAGNOSIS — E876 Hypokalemia: Secondary | ICD-10-CM | POA: Diagnosis not present

## 2016-12-15 DIAGNOSIS — R05 Cough: Secondary | ICD-10-CM | POA: Diagnosis not present

## 2016-12-15 DIAGNOSIS — E785 Hyperlipidemia, unspecified: Secondary | ICD-10-CM | POA: Diagnosis not present

## 2016-12-15 DIAGNOSIS — J189 Pneumonia, unspecified organism: Secondary | ICD-10-CM | POA: Diagnosis present

## 2016-12-15 DIAGNOSIS — E872 Acidosis: Secondary | ICD-10-CM | POA: Diagnosis present

## 2016-12-15 DIAGNOSIS — E119 Type 2 diabetes mellitus without complications: Secondary | ICD-10-CM | POA: Diagnosis not present

## 2016-12-15 DIAGNOSIS — Z7982 Long term (current) use of aspirin: Secondary | ICD-10-CM | POA: Diagnosis not present

## 2016-12-15 HISTORY — DX: Hyperlipidemia, unspecified: E78.5

## 2016-12-15 LAB — COMPREHENSIVE METABOLIC PANEL
ALBUMIN: 2.3 g/dL — AB (ref 3.5–5.0)
ALT: 48 U/L (ref 17–63)
ANION GAP: 10 (ref 5–15)
AST: 48 U/L — AB (ref 15–41)
Alkaline Phosphatase: 63 U/L (ref 38–126)
BILIRUBIN TOTAL: 0.6 mg/dL (ref 0.3–1.2)
BUN: 21 mg/dL — AB (ref 6–20)
CHLORIDE: 90 mmol/L — AB (ref 101–111)
CO2: 28 mmol/L (ref 22–32)
Calcium: 8.2 mg/dL — ABNORMAL LOW (ref 8.9–10.3)
Creatinine, Ser: 0.96 mg/dL (ref 0.61–1.24)
GFR calc Af Amer: >60 mL/min (ref >60)
GLUCOSE: 347 mg/dL — AB (ref 65–99)
POTASSIUM: 2.7 mmol/L — AB (ref 3.5–5.1)
Sodium: 128 mmol/L — ABNORMAL LOW (ref 135–145)
TOTAL PROTEIN: 6.1 g/dL — AB (ref 6.5–8.1)

## 2016-12-15 LAB — URINALYSIS, ROUTINE W REFLEX MICROSCOPIC
Bilirubin Urine: NEGATIVE
Hgb urine dipstick: NEGATIVE
Ketones, ur: NEGATIVE mg/dL
LEUKOCYTES UA: NEGATIVE
NITRITE: NEGATIVE
PH: 7 (ref 5.0–8.0)
Protein, ur: 100 mg/dL — AB
SPECIFIC GRAVITY, URINE: 1.023 (ref 1.005–1.030)

## 2016-12-15 LAB — URINALYSIS, MICROSCOPIC (REFLEX): RBC / HPF: NONE SEEN RBC/hpf (ref 0–5)

## 2016-12-15 LAB — I-STAT CG4 LACTIC ACID, ED
LACTIC ACID, VENOUS: 2.46 mmol/L — AB (ref 0.5–1.9)
Lactic Acid, Venous: 1.31 mmol/L (ref 0.5–1.9)

## 2016-12-15 LAB — CBC WITH DIFFERENTIAL/PLATELET
BASOS ABS: 0 10*3/uL (ref 0.0–0.1)
Basophils Relative: 0 %
EOS PCT: 2 %
Eosinophils Absolute: 0.2 10*3/uL (ref 0.0–0.7)
HCT: 36.2 % — ABNORMAL LOW (ref 39.0–52.0)
HEMOGLOBIN: 12.8 g/dL — AB (ref 13.0–17.0)
LYMPHS ABS: 0.7 10*3/uL (ref 0.7–4.0)
LYMPHS PCT: 9 %
MCH: 32.2 pg (ref 26.0–34.0)
MCHC: 35.4 g/dL (ref 30.0–36.0)
MCV: 91.2 fL (ref 78.0–100.0)
Monocytes Absolute: 0.3 10*3/uL (ref 0.1–1.0)
Monocytes Relative: 4 %
NEUTROS ABS: 7 10*3/uL (ref 1.7–7.7)
Neutrophils Relative %: 85 %
Platelets: 243 10*3/uL (ref 150–400)
RBC: 3.97 MIL/uL — AB (ref 4.22–5.81)
RDW: 12.3 % (ref 11.5–15.5)
WBC: 8.3 10*3/uL (ref 4.0–10.5)

## 2016-12-15 LAB — CBG MONITORING, ED: GLUCOSE-CAPILLARY: 368 mg/dL — AB (ref 65–99)

## 2016-12-15 LAB — GLUCOSE, CAPILLARY
GLUCOSE-CAPILLARY: 320 mg/dL — AB (ref 65–99)
Glucose-Capillary: 194 mg/dL — ABNORMAL HIGH (ref 65–99)

## 2016-12-15 MED ORDER — SODIUM CHLORIDE 0.9 % IV BOLUS (SEPSIS)
250.0000 mL | Freq: Once | INTRAVENOUS | Status: AC
  Administered 2016-12-15: 250 mL via INTRAVENOUS

## 2016-12-15 MED ORDER — SODIUM CHLORIDE 0.9 % IV BOLUS (SEPSIS)
1000.0000 mL | Freq: Once | INTRAVENOUS | Status: AC
  Administered 2016-12-15: 1000 mL via INTRAVENOUS

## 2016-12-15 MED ORDER — DEXTROSE 5 % IV SOLN
500.0000 mg | Freq: Once | INTRAVENOUS | Status: AC
  Administered 2016-12-15: 500 mg via INTRAVENOUS

## 2016-12-15 MED ORDER — CEFTRIAXONE SODIUM 2 G IJ SOLR
2.0000 g | INTRAMUSCULAR | Status: DC
  Filled 2016-12-15: qty 2

## 2016-12-15 MED ORDER — DEXTROSE 5 % IV SOLN: 1.0000 g | Freq: Once | INTRAVENOUS | Status: DC

## 2016-12-15 MED ORDER — POTASSIUM CHLORIDE 10 MEQ/100ML IV SOLN
10.0000 meq | Freq: Once | INTRAVENOUS | Status: AC
  Administered 2016-12-15: 10 meq via INTRAVENOUS
  Filled 2016-12-15: qty 100

## 2016-12-15 MED ORDER — INSULIN ASPART 100 UNIT/ML ~~LOC~~ SOLN
0.0000 [IU] | Freq: Three times a day (TID) | SUBCUTANEOUS | Status: DC
  Administered 2016-12-15 – 2016-12-16 (×2): 2 [IU] via SUBCUTANEOUS
  Administered 2016-12-16: 3 [IU] via SUBCUTANEOUS
  Administered 2016-12-16: 5 [IU] via SUBCUTANEOUS
  Administered 2016-12-17: 2 [IU] via SUBCUTANEOUS

## 2016-12-15 MED ORDER — ENOXAPARIN SODIUM 40 MG/0.4ML ~~LOC~~ SOLN
40.0000 mg | SUBCUTANEOUS | Status: DC
  Administered 2016-12-15 – 2016-12-16 (×2): 40 mg via SUBCUTANEOUS
  Filled 2016-12-15 (×2): qty 0.4

## 2016-12-15 MED ORDER — POTASSIUM CHLORIDE CRYS ER 20 MEQ PO TBCR
30.0000 meq | EXTENDED_RELEASE_TABLET | ORAL | Status: AC
  Administered 2016-12-15 (×2): 30 meq via ORAL
  Filled 2016-12-15 (×2): qty 1

## 2016-12-15 MED ORDER — DEXTROSE 5 % IV SOLN
500.0000 mg | INTRAVENOUS | Status: DC
  Administered 2016-12-16: 500 mg via INTRAVENOUS
  Filled 2016-12-15: qty 500

## 2016-12-15 MED ORDER — POTASSIUM CHLORIDE CRYS ER 20 MEQ PO TBCR
40.0000 meq | EXTENDED_RELEASE_TABLET | Freq: Once | ORAL | Status: AC
  Administered 2016-12-15: 40 meq via ORAL
  Filled 2016-12-15: qty 2

## 2016-12-15 MED ORDER — INSULIN ASPART 100 UNIT/ML ~~LOC~~ SOLN
0.0000 [IU] | Freq: Every day | SUBCUTANEOUS | Status: DC
  Administered 2016-12-15 – 2016-12-16 (×2): 4 [IU] via SUBCUTANEOUS

## 2016-12-15 MED ORDER — AZITHROMYCIN 500 MG IV SOLR
INTRAVENOUS | Status: AC
  Filled 2016-12-15: qty 500

## 2016-12-15 MED ORDER — DEXTROSE 5 % IV SOLN
2.0000 g | Freq: Once | INTRAVENOUS | Status: AC
  Administered 2016-12-15: 2 g via INTRAVENOUS
  Filled 2016-12-15: qty 2

## 2016-12-15 MED ORDER — AMLODIPINE BESYLATE 5 MG PO TABS
5.0000 mg | ORAL_TABLET | Freq: Every day | ORAL | Status: DC
  Administered 2016-12-15 – 2016-12-17 (×3): 5 mg via ORAL
  Filled 2016-12-15 (×3): qty 1

## 2016-12-15 MED ORDER — SODIUM CHLORIDE 0.9 % IV SOLN
INTRAVENOUS | Status: AC
  Administered 2016-12-15 – 2016-12-16 (×2): via INTRAVENOUS

## 2016-12-15 MED ORDER — DEXTROSE 5 % IV SOLN
1.0000 g | INTRAVENOUS | Status: DC
  Administered 2016-12-16 – 2016-12-17 (×2): 1 g via INTRAVENOUS
  Filled 2016-12-15 (×2): qty 10

## 2016-12-15 MED ORDER — DEXTROSE 5 % IV SOLN
500.0000 mg | INTRAVENOUS | Status: DC
  Filled 2016-12-15: qty 500

## 2016-12-15 MED ORDER — ASPIRIN 81 MG PO CHEW
81.0000 mg | CHEWABLE_TABLET | Freq: Every day | ORAL | Status: DC
  Administered 2016-12-16 – 2016-12-17 (×2): 81 mg via ORAL
  Filled 2016-12-15 (×2): qty 1

## 2016-12-15 NOTE — ED Notes (Signed)
MD notified pt's potassium is 2.7 and sodium 128. No verbal orders received at this time.

## 2016-12-15 NOTE — ED Notes (Signed)
Lactic was drawn at 1150. Will check on repeat

## 2016-12-15 NOTE — ED Notes (Signed)
Unsuccessful IV attempts x2 (1 by RT, one by this RN). MD made aware.

## 2016-12-15 NOTE — ED Notes (Signed)
Attempted to call pt's son, per son's and pt's request, to let him know ETA for [facility] -- no answer. [facility] (phone number given).

## 2016-12-15 NOTE — ED Notes (Signed)
Patient denies pain and is resting comfortably.  

## 2016-12-15 NOTE — ED Triage Notes (Addendum)
Pt's son sts pt dx with PNA 1 week ago; fever and SHOB continue; c/o fatigue; also has had constant hiccups  X 1 wk

## 2016-12-15 NOTE — ED Notes (Signed)
Pt son at the bedside

## 2016-12-15 NOTE — ED Provider Notes (Signed)
[facility]Y [facility] Provider Note   CSN: [id number] Arrival date & time: 12/15/16  1046     History   Chief Complaint Chief Complaint  Patient presents with  . Shortness of Breath    HPI Aaron Blair is a 83 y.o. male.  HPI Patient has had cough with subjective fever for approximately one week. Wednesday 3 days ago he was started on Levaquin by his family provider. He has continued to get worse. He has increasing cough, body aches and headache. Ever since onset of symptoms is been having frequent hiccups as well. Past Medical History:  Diagnosis Date  . Chronic gastritis   . Diabetes (HCC)    Type II  . Hyperlipidemia   . Hypertension   . Lumbar herniated disc    L3-4  . Vertigo     Patient Active Problem List   Diagnosis Date Noted  . CAP (community acquired pneumonia) 12/15/2016  . Diabetes mellitus (HCC) 12/15/2016  . Left carotid stenosis 07/17/2016    Past Surgical History:  Procedure Laterality Date  . BACK SURGERY    . COLONOSCOPY    . ENDARTERECTOMY Left 07/17/2016   Procedure: ENDARTERECTOMY CAROTID;  Surgeon: Sherren Kerns, MD;  Location: [facility] OR;  Service: Vascular;  Laterality: Left;  . No past surgery    . SPINE SURGERY  02/05/2016   Dr. Audrie Lia       Home Medications    Prior to Admission medications   Medication Sig Start Date End Date Taking? Authorizing Provider  amLODipine (NORVASC) 5 MG tablet Take 5 mg by mouth daily.    [provider]  aspirin 81 MG tablet Take 81 mg by mouth daily.    [provider]  Camphor-Menthol-Methyl Sal (TIGER BALM MUSCLE RUB) 10-14-13 % CREA Apply 1 application topically daily as needed (pain/aches).     [provider]  cephALEXin (KEFLEX) 500 MG capsule Take 1 capsule (500 mg total) by mouth 2 (two) times daily. 08/01/16   Sherren Kerns, MD  glimepiride (AMARYL) 2 MG tablet Take 2 mg by mouth daily with breakfast.    [provider]  LIVALO 2 MG TABS Take 2 mg by  mouth daily. 06/21/16   [provider]  oxyCODONE-acetaminophen (PERCOCET/ROXICET) 5-325 MG tablet Take 1 tablet by mouth every 6 (six) hours as needed for moderate pain. 07/18/16   Lars Mage, PA-C  pioglitazone (ACTOS) 30 MG tablet Take 30 mg by mouth daily.    [provider]  SitaGLIPtin-MetFORMIN HCl (JANUMET XR) [phone] MG TB24 Take 1 tablet by mouth 2 (two) times daily.     [provider]  valsartan-hydrochlorothiazide (DIOVAN-HCT) 320-25 MG per tablet Take 1 tablet by mouth daily.    [provider]    Family History No family history on file.  Social History Social History  Substance Use Topics  . Smoking status: Former Smoker    Years: 60.00    Quit date: 06/28/2011  . Smokeless tobacco: Never Used     Comment: Quit 3 years ago.  . Alcohol use No     Allergies   No known allergies   Review of Systems Review of Systems 10 Systems reviewed and are negative for acute change except as noted in the HPI.   Physical Exam Updated Vital Signs BP (!) 149/85   Pulse 91   Temp 98.1 F (36.7 C) (Oral)   Resp 19   Wt 147 lb 14.4 oz (67.1 kg)   SpO2  98%   BMI 25.39 kg/m   Physical Exam  Constitutional: He is oriented to person, place, and time. He appears well-developed and well-nourished.  Patient appears mildly ill and uncomfortable. He has mild tachypnea and intermittent cough.  HENT:  Head: Normocephalic and atraumatic.  Nose: Nose normal.  Mouth/Throat: Oropharynx is clear and moist.  Eyes: Conjunctivae and EOM are normal.  Neck: Neck supple.  Cardiovascular: Normal rate and regular rhythm.   No murmur heard. Pulmonary/Chest: Breath sounds normal. No respiratory distress.  Tachypnea, intermittent cough  Abdominal: Soft. He exhibits no distension.  Musculoskeletal: He exhibits no edema or tenderness.  Neurological: He is alert and oriented to person, place, and time. He exhibits normal muscle tone. Coordination  normal.  Skin: Skin is warm and dry.  Psychiatric: He has a normal mood and affect.  Nursing note and vitals reviewed.    ED Treatments / Results  Labs (all labs ordered are listed, but only abnormal results are displayed) Labs Reviewed  COMPREHENSIVE METABOLIC PANEL - Abnormal; Notable for the following:       Result Value   Sodium 128 (*)    Potassium 2.7 (*)    Chloride 90 (*)    Glucose, Bld 347 (*)    BUN 21 (*)    Calcium 8.2 (*)    Total Protein 6.1 (*)    Albumin 2.3 (*)    AST 48 (*)    All other components within normal limits  CBC WITH DIFFERENTIAL/PLATELET - Abnormal; Notable for the following:    RBC 3.97 (*)    Hemoglobin 12.8 (*)    HCT 36.2 (*)    All other components within normal limits  URINALYSIS, ROUTINE W REFLEX MICROSCOPIC - Abnormal; Notable for the following:    Glucose, UA >=500 (*)    Protein, ur 100 (*)    All other components within normal limits  URINALYSIS, MICROSCOPIC (REFLEX) - Abnormal; Notable for the following:    Bacteria, UA MANY (*)    Squamous Epithelial / LPF 0-5 (*)    All other components within normal limits  CBG MONITORING, ED - Abnormal; Notable for the following:    Glucose-Capillary 368 (*)    All other components within normal limits  I-STAT CG4 LACTIC ACID, ED - Abnormal; Notable for the following:    Lactic Acid, Venous 2.46 (*)    All other components within normal limits  CULTURE, BLOOD (ROUTINE X 2)  CULTURE, BLOOD (ROUTINE X 2)  INFLUENZA PANEL BY PCR (TYPE A & B)  I-STAT CG4 LACTIC ACID, ED    EKG  EKG Interpretation None       Radiology Dg Chest 2 View  Result Date: 12/15/2016 CLINICAL DATA:  83 year old male with cough and fever. EXAM: CHEST  2 VIEW COMPARISON:  None. FINDINGS: Cardiomegaly noted. Diffuse left lung airspace disease likely represents pneumonia. The right lung is clear. There is no evidence of pleural effusion or pneumothorax. No acute bony abnormalities are identified. IMPRESSION:  Diffuse left lung airspace disease likely representing pneumonia. Cardiomegaly. Electronically Signed   By: Harmon Pier M.D.   On: 12/15/2016 13:11    Procedures Procedures (including critical care time) CRITICAL CARE Performed by: Arby Barrette   Total critical care time: 30 minutes  Critical care time was exclusive of separately billable procedures and treating other patients.  Critical care was necessary to treat or prevent imminent or life-threatening deterioration.  Critical care was time spent personally by me on the following activities: development  of treatment plan with patient and/or surrogate as well as nursing, discussions with consultants, evaluation of patient's response to treatment, examination of patient, obtaining history from patient or surrogate, ordering and performing treatments and interventions, ordering and review of laboratory studies, ordering and review of radiographic studies, pulse oximetry and re-evaluation of patient's condition. Medications Ordered in ED Medications  azithromycin (ZITHROMAX) 500 mg in dextrose 5 % 250 mL IVPB (not administered)  cefTRIAXone (ROCEPHIN) 2 g in dextrose 5 % 50 mL IVPB (not administered)  azithromycin (ZITHROMAX) 500 MG injection (  Not Given 12/15/16 1301)  sodium chloride 0.9 % bolus 1,000 mL (0 mLs Intravenous Stopped 12/15/16 1223)    And  sodium chloride 0.9 % bolus 1,000 mL (0 mLs Intravenous Stopped 12/15/16 1318)    And  sodium chloride 0.9 % bolus 250 mL (0 mLs Intravenous Stopped 12/15/16 1339)  azithromycin (ZITHROMAX) 500 mg in dextrose 5 % 250 mL IVPB (0 mg Intravenous Stopped 12/15/16 1418)  cefTRIAXone (ROCEPHIN) 2 g in dextrose 5 % 50 mL IVPB (0 g Intravenous Stopped 12/15/16 1300)  potassium chloride SA (K-DUR,KLOR-CON) CR tablet 40 mEq (40 mEq Oral Given 12/15/16 1256)  potassium chloride 10 mEq in 100 mL IVPB (0 mEq Intravenous Stopped 12/15/16 1500)     Initial Impression / Assessment and Plan / ED Course  I have  reviewed the triage vital signs and the nursing notes.  Pertinent labs & imaging results that were available during my care of the patient were reviewed by me and considered in my medical decision making (see chart for details).     Consult: [facility] hospitalist  Final Clinical Impressions(s) / ED Diagnoses   Final diagnoses:  Community acquired pneumonia of left lower lobe of lung (HCC)  Sepsis, due to unspecified organism (HCC)  Hypokalemia   Patient presents with signs of infectious pneumonia. He has had fever bodyaches, headache and cough. Chest x-ray shows focal pneumonia. Patient does have hypoxia and tachypnea. Sepsis protocol initiated. He also has electrolyte disturbance. Patient's airway remained stable and mental status remains clear. Patient be admitted for ongoing treatment. New Prescriptions Current Discharge Medication List       Arby BarrettePfeiffer, Axtyn Woehler, MD 12/15/16 [phone]

## 2016-12-15 NOTE — ED Notes (Signed)
ED Provider at bedside. 

## 2016-12-15 NOTE — H&P (Signed)
History and Physical    Aaron Blair ZOX:[id number] DOB: 26-Sep-1933 DOA: 12/15/2016  I have briefly reviewed the patient's prior medical records in [facility]  PCP: Pearson Grippe, MD  Patient coming from: Home  Chief Complaint: Fever and chills  HPI: Aaron Blair is a 83 y.o. male with medical history significant of hypertension, hyperlipidemia, type 2 diabetes mellitus, presents to the emergency room with chief complaint of fever and chills over the last week.  Patient knows a little [place] but his native language is [place], per his preference his son is interpreting.  He tells me that he has been having upper respiratory type symptoms, as well as fever and chills over the last week.  He saw his PCP about 3 days ago, and he was given an antibiotic, however despite the antibiotic he has not gotten any better.  Is also been complaining of poor appetite and poor p.o. intake over the last several days, and progressive generalized weakness.  Every time he gets fever, he has been having diffuse muscle aches.  He also is complaining of nausea without vomiting.  He denies any diarrhea.  He has no chest pain.  ED Course: In the emergency room his vital signs are stable, he is afebrile, normotensive, his blood work shows a sodium of 128, potassium of 2.7, his initial lactic acid was 2.4 and improved to 1.3 after fluid resuscitation.  Chest x-ray with diffuse left lung airspace disease.  He was started on ceftriaxone and azithromycin.  TRH was asked for admission.  Review of Systems: As per HPI otherwise 10 point review of systems negative.   Past Medical History:  Diagnosis Date  . Chronic gastritis   . Diabetes (HCC)    Type II  . Hyperlipidemia   . Hypertension   . Lumbar herniated disc    L3-4  . Vertigo     Past Surgical History:  Procedure Laterality Date  . BACK SURGERY    . COLONOSCOPY    . ENDARTERECTOMY Left 07/17/2016   Procedure: ENDARTERECTOMY CAROTID;  Surgeon: Sherren Kerns, MD;  Location: [facility] OR;  Service: Vascular;  Laterality: Left;  . No past surgery    . SPINE SURGERY  02/05/2016   Dr. Audrie Lia     reports that he quit smoking about 5 years ago. He quit after 60.00 years of use. He has never used smokeless tobacco. He reports that he does not drink alcohol or use drugs.  Allergies  Allergen Reactions  . No Known Allergies    Family history reviewed and noncontributory  Prior to Admission medications   Medication Sig Start Date End Date Taking? Authorizing Provider  amLODipine (NORVASC) 5 MG tablet Take 5 mg by mouth daily.   Yes [provider]  aspirin 81 MG tablet Take 81 mg by mouth daily.   Yes [provider]  Camphor-Menthol-Methyl Sal (TIGER BALM MUSCLE RUB) 10-14-13 % CREA Apply 1 application topically daily as needed (pain/aches).    Yes [provider]  glimepiride (AMARYL) 2 MG tablet Take 2 mg by mouth daily with breakfast.   Yes [provider]  levofloxacin (LEVAQUIN) 500 MG tablet Take 500 mg by mouth daily. 12/11/16  Yes [provider]  metoCLOPramide (REGLAN) 10 MG tablet Take 10 mg by mouth 3 (three) times daily before meals. 12/12/16  Yes [provider]  pioglitazone (ACTOS) 30 MG tablet Take 30 mg by mouth daily.   Yes [provider]  SitaGLIPtin-MetFORMIN HCl (  JANUMET XR) [phone] MG TB24 Take 1 tablet by mouth daily.    Yes [provider]  valsartan-hydrochlorothiazide (DIOVAN-HCT) 320-25 MG per tablet Take 1 tablet by mouth daily.   Yes [provider]  cephALEXin (KEFLEX) 500 MG capsule Take 1 capsule (500 mg total) by mouth 2 (two) times daily. 08/01/16   Sherren KernsFields, Charles E, MD  LIVALO 2 MG TABS Take 2 mg by mouth daily. 06/21/16   [provider]  oxyCODONE-acetaminophen (PERCOCET/ROXICET) 5-325 MG tablet Take 1 tablet by mouth every 6 (six) hours as needed for moderate pain. 07/18/16   Lars Mageollins, Emma M, PA-C    Physical Exam: Vitals:    12/15/16 1421 12/15/16 1430 12/15/16 1500 12/15/16 1614  BP:  (!) 154/88 (!) 149/85 (!) 156/81  Pulse:  92 91 94  Resp:  20 19 18   Temp: 98.1 F (36.7 C)   98.5 F (36.9 C)  TempSrc: Oral   Oral  SpO2:  97% 98% 96%  Weight:    67.1 kg (147 lb 14.4 oz)  Height:    5\' 4"  (1.626 m)    Constitutional: NAD, calm, comfortable Eyes: lids and conjunctivae normal ENMT: Mucous membranes are moist.  Neck: normal, supple Respiratory: mild rhonchi left lung field, no wheezing, no crackles, moves air well.  No accessory muscle use. Cardiovascular: Regular rate and rhythm, no murmurs / rubs / gallops.   Abdomen: no tenderness, no masses palpated. Bowel sounds positive.  Musculoskeletal: no clubbing / cyanosis. Normal muscle tone.  Skin: no rashes, lesions, ulcers. No induration Neurologic: CN 2-12 grossly intact. Strength 5/5 in all 4.  Psychiatric: Normal judgment and insight. Alert and oriented x 3. Normal mood.   Labs on Admission: I have personally reviewed following labs and imaging studies  CBC:  Recent Labs Lab 12/15/16 1129  WBC 8.3  NEUTROABS 7.0  HGB 12.8*  HCT 36.2*  MCV 91.2  PLT 243   Basic Metabolic Panel:  Recent Labs Lab 12/15/16 1129  NA 128*  K 2.7*  CL 90*  CO2 28  GLUCOSE 347*  BUN 21*  CREATININE 0.96  CALCIUM 8.2*   GFR: Estimated Creatinine Clearance: 51.4 mL/min (by C-G formula based on SCr of 0.96 mg/dL). Liver Function Tests:  Recent Labs Lab 12/15/16 1129  AST 48*  ALT 48  ALKPHOS 63  BILITOT 0.6  PROT 6.1*  ALBUMIN 2.3*   No results for input(s): LIPASE, AMYLASE in the last 168 hours. No results for input(s): AMMONIA in the last 168 hours. Coagulation Profile: No results for input(s): INR, PROTIME in the last 168 hours. Cardiac Enzymes: No results for input(s): CKTOTAL, CKMB, CKMBINDEX, TROPONINI in the last 168 hours. BNP (last 3 results) No results for input(s): PROBNP in the last 8760 hours. HbA1C: No results for input(s):  HGBA1C in the last 72 hours. CBG:  Recent Labs Lab 12/15/16 1056 12/15/16 1636  GLUCAP 368* 194*   Lipid Profile: No results for input(s): CHOL, HDL, LDLCALC, TRIG, CHOLHDL, LDLDIRECT in the last 72 hours. Thyroid Function Tests: No results for input(s): TSH, T4TOTAL, FREET4, T3FREE, THYROIDAB in the last 72 hours. Anemia Panel: No results for input(s): VITAMINB12, FOLATE, FERRITIN, TIBC, IRON, RETICCTPCT in the last 72 hours. Urine analysis:    Component Value Date/Time   COLORURINE YELLOW 12/15/2016 1216   APPEARANCEUR CLEAR 12/15/2016 1216   LABSPEC 1.023 12/15/2016 1216   PHURINE 7.0 12/15/2016 1216   GLUCOSEU >=500 (A) 12/15/2016 1216   HGBUR NEGATIVE 12/15/2016 1216   BILIRUBINUR NEGATIVE  12/15/2016 1216   KETONESUR NEGATIVE 12/15/2016 1216   PROTEINUR 100 (A) 12/15/2016 1216   NITRITE NEGATIVE 12/15/2016 1216   LEUKOCYTESUR NEGATIVE 12/15/2016 1216     Radiological Exams on Admission: Dg Chest 2 View  Result Date: 12/15/2016 CLINICAL DATA:  83 year old male with cough and fever. EXAM: CHEST  2 VIEW COMPARISON:  None. FINDINGS: Cardiomegaly noted. Diffuse left lung airspace disease likely represents pneumonia. The right lung is clear. There is no evidence of pleural effusion or pneumothorax. No acute bony abnormalities are identified. IMPRESSION: Diffuse left lung airspace disease likely representing pneumonia. Cardiomegaly. Electronically Signed   By: Harmon Pier M.D.   On: 12/15/2016 13:11    EKG: Independently reviewed. Sinus rhythm  Assessment/Plan Active Problems:   CAP (community acquired pneumonia)   Diabetes mellitus (HCC)   Hyponatremia   Hypokalemia   Community-acquired pneumonia with hypoxia -Patient started on ceftriaxone and azithromycin, continue.  Rule out influenza, if positive will start Tamiflu however he is out of the window given symptoms greater than 48 hours -Supportive treatment, wean off oxygen as tolerated  Hyponatremia -hold HCTZ,  likely dehydrated due to poor p.o. intake, provide IV fluids and recheck sodium tomorrow morning  Hypertension -Resume amlodipine, hold valsartan HCTZ  Hypokalemia -Likely due to poor p.o. intake replete and recheck in the morning  Type 2 diabetes mellitus -Hold home oral agents, place on sliding scale insulin   DVT prophylaxis: Lovenox Code Status: Full code Family Communication: Discussed with son at bedside Disposition Plan: Admit to MedSurg, home when ready Consults called: None    Admission status: Inpatient   At the time of admission, it appears that the appropriate admission status for this patient is INPATIENT. This is judged to be reasonable and necessary in order to provide the required high service intensity to ensure the patient's safety given the presenting symptoms, physical exam findings, and initial radiographic and laboratory data in the context of their chronic comorbidities. Current circumstances are pneumonia, hypoxia, and it is felt to place patient at high risk for further clinical deterioration threatening life, limb, or organ. Moreover, it is my clinical judgment that the patient will require inpatient hospital care spanning beyond 2 midnights from the point of admission and that early discharge would result in unnecessary risk of decompensation and readmission or threat to life, limb or bodily function.   Pamella Pert, MD [facility] Pager [phone]  If 7PM-7AM, please contact night-coverage [email or web address] Password [email or web address]  12/15/2016, 4:40 PM

## 2016-12-15 NOTE — ED Notes (Signed)
Patient transported to X-ray 

## 2016-12-15 NOTE — Progress Notes (Signed)
Pharmacy Antibiotic Note  Aaron Blair is a 83 y.o. male admitted on 12/15/2016 with pneumonia.  Pharmacy has been consulted for Azithromycin and Ceftriaxone dosing.  Initial doses have been ordered.  Plan: Continue Azithromycin 500mg  IV q24, next dose 5/7 Change Ceftriaxone to 2g IV x1, then continue q24 Watch clinical status and f/u blood cx   Weight: 147 lb 14.4 oz (67.1 kg)  Temp (24hrs), Avg:98.3 F (36.8 C), Min:98.3 F (36.8 C), Max:98.3 F (36.8 C)   Recent Labs Lab 12/15/16 1150  LATICACIDVEN 2.46*    CrCl cannot be calculated (Patient's most recent lab result is older than the maximum 21 days allowed.).    Allergies  Allergen Reactions  . No Known Allergies     Antimicrobials this admission: 5/6 Azithromycin >>  5/6 Ceftriaxone  >>   Dose adjustments this admission:   Microbiology results: 5/6 BCx:    Thank you for allowing pharmacy to be a part of this patient's care.  Alvester MorinKendra Vyolet Sakuma, B.S., PharmD Clinical Pharmacist [facility]

## 2016-12-16 ENCOUNTER — Encounter: Payer: Self-pay | Admitting: *Deleted

## 2016-12-16 DIAGNOSIS — A419 Sepsis, unspecified organism: Principal | ICD-10-CM

## 2016-12-16 DIAGNOSIS — J9601 Acute respiratory failure with hypoxia: Secondary | ICD-10-CM

## 2016-12-16 LAB — CBC
HEMATOCRIT: 30.7 % — AB (ref 39.0–52.0)
Hemoglobin: 10.7 g/dL — ABNORMAL LOW (ref 13.0–17.0)
MCH: 31.8 pg (ref 26.0–34.0)
MCHC: 34.9 g/dL (ref 30.0–36.0)
MCV: 91.1 fL (ref 78.0–100.0)
Platelets: 248 10*3/uL (ref 150–400)
RBC: 3.37 MIL/uL — ABNORMAL LOW (ref 4.22–5.81)
RDW: 12.9 % (ref 11.5–15.5)
WBC: 8.4 10*3/uL (ref 4.0–10.5)

## 2016-12-16 LAB — RESPIRATORY PANEL BY PCR
Adenovirus: NOT DETECTED
Bordetella pertussis: NOT DETECTED
CORONAVIRUS OC43-RVPPCR: NOT DETECTED
Chlamydophila pneumoniae: NOT DETECTED
Coronavirus 229E: NOT DETECTED
Coronavirus HKU1: NOT DETECTED
Coronavirus NL63: NOT DETECTED
INFLUENZA A-RVPPCR: NOT DETECTED
INFLUENZA B-RVPPCR: NOT DETECTED
MYCOPLASMA PNEUMONIAE-RVPPCR: NOT DETECTED
Metapneumovirus: NOT DETECTED
PARAINFLUENZA VIRUS 1-RVPPCR: NOT DETECTED
PARAINFLUENZA VIRUS 4-RVPPCR: NOT DETECTED
Parainfluenza Virus 2: NOT DETECTED
Parainfluenza Virus 3: NOT DETECTED
RESPIRATORY SYNCYTIAL VIRUS-RVPPCR: NOT DETECTED
Rhinovirus / Enterovirus: NOT DETECTED

## 2016-12-16 LAB — BASIC METABOLIC PANEL
Anion gap: 7 (ref 5–15)
BUN: 12 mg/dL (ref 6–20)
CHLORIDE: 102 mmol/L (ref 101–111)
CO2: 25 mmol/L (ref 22–32)
Calcium: 7.5 mg/dL — ABNORMAL LOW (ref 8.9–10.3)
Creatinine, Ser: 0.68 mg/dL (ref 0.61–1.24)
GFR calc Af Amer: >60 mL/min (ref >60)
GFR calc non Af Amer: >60 mL/min (ref >60)
GLUCOSE: 166 mg/dL — AB (ref 65–99)
POTASSIUM: 3.3 mmol/L — AB (ref 3.5–5.1)
Sodium: 134 mmol/L — ABNORMAL LOW (ref 135–145)

## 2016-12-16 LAB — GLUCOSE, CAPILLARY
GLUCOSE-CAPILLARY: 292 mg/dL — AB (ref 65–99)
Glucose-Capillary: 170 mg/dL — ABNORMAL HIGH (ref 65–99)
Glucose-Capillary: 227 mg/dL — ABNORMAL HIGH (ref 65–99)
Glucose-Capillary: 332 mg/dL — ABNORMAL HIGH (ref 65–99)

## 2016-12-16 LAB — STREP PNEUMONIAE URINARY ANTIGEN: STREP PNEUMO URINARY ANTIGEN: NEGATIVE

## 2016-12-16 LAB — INFLUENZA PANEL BY PCR (TYPE A & B)
INFLAPCR: NEGATIVE
INFLBPCR: NEGATIVE

## 2016-12-16 LAB — MRSA PCR SCREENING: MRSA by PCR: NEGATIVE

## 2016-12-16 MED ORDER — POTASSIUM CHLORIDE CRYS ER 20 MEQ PO TBCR
40.0000 meq | EXTENDED_RELEASE_TABLET | ORAL | Status: AC
  Administered 2016-12-16 (×2): 40 meq via ORAL
  Filled 2016-12-16 (×2): qty 2

## 2016-12-16 MED ORDER — SODIUM CHLORIDE 0.9 % IV SOLN
INTRAVENOUS | Status: DC
  Administered 2016-12-16 – 2016-12-17 (×2): via INTRAVENOUS

## 2016-12-16 MED ORDER — AZITHROMYCIN 250 MG PO TABS
500.0000 mg | ORAL_TABLET | Freq: Every day | ORAL | Status: DC
  Administered 2016-12-17: 500 mg via ORAL
  Filled 2016-12-16: qty 2

## 2016-12-16 NOTE — Progress Notes (Addendum)
PROGRESS NOTE  Aaron Blair [id number]:[id number] DOB: May 08, 1934 DOA: 12/15/2016 PCP: Pearson Grippe, MD  HPI/Recap of past 24 hours:  Intermittent dry cough, denies chest pain, no fever, on 2liter at rest  Assessment/Plan: Active Problems:   CAP (community acquired pneumonia)   Diabetes mellitus (HCC)   Hyponatremia   Hypokalemia   Hypoxia   Acute hypoxic respiratory failure, left lobar pneumonia, mild sepsis on admission with sinus tachycardia hr 105, lactic acidosis, fever at home Failed outpatient abx treatment with levaquin cxr  On admission "Diffuse left lung airspace disease likely representing pneumonia. Cardiomegaly. " Negative flu, respiratory panel negative, urine strep pneumo negative, mrsa screening pending, sputum sample pending collection, speech eval pending Continue rocephin/zithro  Metabolic encephalopathy:  Son concerned that patient is confused, request brain imagine, explained to patient and son, slight confusion likely from metabolic encephalopathy from infection and electrolyte abnormalities Expect improvement, will get ct head, if mental status dose not improve  Hyponatremia: sodium 128 on admission, likely from poor oral intake, dehydration, sodium improved , today 134 , continue hydration  Hypokalemia, from poor oral intake, k replace k 2.7 on admission, check mag  nonsinulin dependent dm2:  a1c pending, home oral meds held, on ssi   HTN: continue home meds norvasc, hold vlasartan /hctz in the setting of volume depletion Likely able to resume home bp meds at discharge  h/o carotid stenosis, s/p left CEA in 07/2016 by Dr Darrick Penna , continue asa  Code Status: full  Family Communication: patient , son updated over the phone  Disposition Plan: home in 24-48 hrs    Consultants:  none  Procedures:  none  Antibiotics:  Rocephin/zithro   Objective: BP (!) 155/80 (BP Location: Left Arm)   Pulse (!) 101   Temp 98.6 F (37 C) (Oral)   Resp 20   Ht  5\' 4"  (1.626 m)   Wt 67.1 kg (147 lb 14.4 oz)   SpO2 93%   BMI 25.39 kg/m   Intake/Output Summary (Last 24 hours) at 12/16/16 0854 Last data filed at 12/16/16 0512  Gross per 24 hour  Intake          3663.75 ml  Output             1075 ml  Net          2588.75 ml   Filed Weights   12/15/16 1156 12/15/16 1614  Weight: 67.1 kg (147 lb 14.4 oz) 67.1 kg (147 lb 14.4 oz)    Exam:   General:  NAD  Cardiovascular: RRR  Respiratory: crackles left lung,  Abdomen: Soft/ND/NT, positive BS  Musculoskeletal: No Edema  Neuro: aaox3  Data Reviewed: Basic Metabolic Panel:  Recent Labs Lab 12/15/16 1129 12/16/16 0536  NA 128* 134*  K 2.7* 3.3*  CL 90* 102  CO2 28 25  GLUCOSE 347* 166*  BUN 21* 12  CREATININE 0.96 0.68  CALCIUM 8.2* 7.5*   Liver Function Tests:  Recent Labs Lab 12/15/16 1129  AST 48*  ALT 48  ALKPHOS 63  BILITOT 0.6  PROT 6.1*  ALBUMIN 2.3*   No results for input(s): LIPASE, AMYLASE in the last 168 hours. No results for input(s): AMMONIA in the last 168 hours. CBC:  Recent Labs Lab 12/15/16 1129 12/16/16 0536  WBC 8.3 8.4  NEUTROABS 7.0  --   HGB 12.8* 10.7*  HCT 36.2* 30.7*  MCV 91.2 91.1  PLT 243 248   Cardiac Enzymes:   No results for input(s): CKTOTAL, CKMB,  CKMBINDEX, TROPONINI in the last 168 hours. BNP (last 3 results) No results for input(s): BNP in the last 8760 hours.  ProBNP (last 3 results) No results for input(s): PROBNP in the last 8760 hours.  CBG:  Recent Labs Lab 12/15/16 1056 12/15/16 1636 12/15/16 2027 12/16/16 0715  GLUCAP 368* 194* 320* 170*    No results found for this or any previous visit (from the past 240 hour(s)).   Studies: Dg Chest 2 View  Result Date: 12/15/2016 CLINICAL DATA:  83 year old male with cough and fever. EXAM: CHEST  2 VIEW COMPARISON:  None. FINDINGS: Cardiomegaly noted. Diffuse left lung airspace disease likely represents pneumonia. The right lung is clear. There is no  evidence of pleural effusion or pneumothorax. No acute bony abnormalities are identified. IMPRESSION: Diffuse left lung airspace disease likely representing pneumonia. Cardiomegaly. Electronically Signed   By: Harmon PierJeffrey  Hu M.D.   On: 12/15/2016 13:11    Scheduled Meds: . amLODipine  5 mg Oral Daily  . aspirin  81 mg Oral Daily  . enoxaparin (LOVENOX) injection  40 mg Subcutaneous Q24H  . insulin aspart  0-5 Units Subcutaneous QHS  . insulin aspart  0-9 Units Subcutaneous TID WC    Continuous Infusions: . sodium chloride 75 mL/hr at 12/16/16 0512  . azithromycin    . azithromycin    . cefTRIAXone (ROCEPHIN)  IV    . cefTRIAXone (ROCEPHIN)  IV       Time spent: 25 mins  Daegen Berrocal MD, PhD  [facility] Pager [phone]. If 7PM-7AM, please contact night-coverage at [email or web address], password [email or web address] 12/16/2016, 8:54 AM  LOS: 1 day

## 2016-12-16 NOTE — Evaluation (Signed)
SLP Cancellation Note  Patient Details Name: Aaron Blair MRN: [id number] DOB: 17-May-1934   Cancelled treatment:       Reason Eval/Treat Not Completed: Patient at procedure or test/unavailable (pt in restroom with NT, will continue efforts)   [name], [name], MS [facility] S[facility] [phone]

## 2016-12-16 NOTE — Congregational Nurse Program (Unsigned)
Congregational Nurse Program Note  Date of Encounter: 12/16/2016  Past Medical History: Past Medical History:  Diagnosis Date  . Chronic gastritis   . Diabetes (HCC)    Type II  . Hyperlipidemia   . Hypertension   . Lumbar herniated disc    L3-4  . Vertigo     Encounter Details:     CNP Questionnaire - 12/16/16 1000      Patient Demographics   Is this a new or existing patient? Existing   Patient is considered a/an Immigrant   Race Asian     Patient Assistance   Location of Patient Assistance Not Applicable   Patient's financial/insurance status Medicare   Uninsured Patient ([employer], trade unionCard/Care Connects) No   Patient referred to apply for the following financial assistance Not Applicable   Food insecurities addressed Not Applicable   Transportation assistance No   Assistance securing medications No   Educational health offerings Diabetes;Spiritual care;Other  health care POA and living will     Encounter Details   Primary purpose of visit Post ED/Hospitalization Visit   Was an Emergency Department visit averted? Not Applicable   Does patient have a medical provider? No   Patient referred to Not Applicable   Was a mental health screening completed? (GAINS tool) No   Does patient have dental issues? No   Does patient have vision issues? No   Does your patient have an abnormal blood pressure today? No   Since previous encounter, have you referred patient for abnormal blood pressure that resulted in a new diagnosis or medication change? No   Does your patient have an abnormal blood glucose today? No   Since previous encounter, have you referred patient for abnormal blood glucose that resulted in a new diagnosis or medication change? No   Was there a life-saving intervention made? No     [facility].  Language limited, translated and ordered meals  By phone. Pt stated did not know discharge today or not. Told him needed wait Dr's rounding.  I was waiting,  Dr's visit Pt.,  til 10am, unable to meet Dr. Valorie RooseveltUpdated pt's status to wife who is at work.

## 2016-12-16 NOTE — Care Management Note (Signed)
Case Management Note  Patient Details  Name: Deloria LairMyung B Klee MRN: [id number] Date of Birth: 1934/03/28  Subjective/Objective:         pna           Action/Plan:  From Home  Date:  Dec 16, 2016  Chart reviewed for concurrent status and case management needs.  Will continue to follow patient progress.  Discharge Planning: following for needs  Expected discharge date: [id number]  Marcelle SmilingRhonda Letricia Krinsky, BSN, [place]RN3, [place]CCM   [phone]   Expected Discharge Date:  12/18/16               Expected Discharge Plan:     In-House Referral:     Discharge planning Services     Post Acute Care Choice:    Choice offered to:     DME Arranged:    DME Agency:     HH Arranged:    HH Agency:     Status of Service:     If discussed at [employer] Length of Stay Meetings, dates discussed:    Additional Comments:  Golda AcreDavis, Tajai Suder Lynn, RN 12/16/2016, 9:11 AM

## 2016-12-16 NOTE — Progress Notes (Signed)
PT Cancellation Note  Patient Details Name: [name] MRN: [id number] DOB: [birth year]   Cancelled Treatment:    Reason Eval/Treat Not Completed: PT screened, no needs identified, will sign off--per RN, patient is ambulating without assitance.   Rada HayHill, Estefani Bateson Elizabeth 12/16/2016, 4:18 PM Blanchard KelchKaren Gerasimos Plotts PT [phone]

## 2016-12-17 LAB — COMPREHENSIVE METABOLIC PANEL
ALBUMIN: 2.2 g/dL — AB (ref 3.5–5.0)
ALT: 33 U/L (ref 17–63)
AST: 27 U/L (ref 15–41)
Alkaline Phosphatase: 61 U/L (ref 38–126)
Anion gap: 9 (ref 5–15)
BUN: 8 mg/dL (ref 6–20)
CHLORIDE: 101 mmol/L (ref 101–111)
CO2: 24 mmol/L (ref 22–32)
Calcium: 7.9 mg/dL — ABNORMAL LOW (ref 8.9–10.3)
Creatinine, Ser: 0.57 mg/dL — ABNORMAL LOW (ref 0.61–1.24)
GFR calc Af Amer: >60 mL/min (ref >60)
GFR calc non Af Amer: >60 mL/min (ref >60)
GLUCOSE: 171 mg/dL — AB (ref 65–99)
POTASSIUM: 3.6 mmol/L (ref 3.5–5.1)
Sodium: 134 mmol/L — ABNORMAL LOW (ref 135–145)
Total Bilirubin: 0.7 mg/dL (ref 0.3–1.2)
Total Protein: 5.6 g/dL — ABNORMAL LOW (ref 6.5–8.1)

## 2016-12-17 LAB — CBC WITH DIFFERENTIAL/PLATELET
BASOS ABS: 0 10*3/uL (ref 0.0–0.1)
BASOS PCT: 0 %
EOS PCT: 3 %
Eosinophils Absolute: 0.3 10*3/uL (ref 0.0–0.7)
HCT: 32.3 % — ABNORMAL LOW (ref 39.0–52.0)
Hemoglobin: 11.3 g/dL — ABNORMAL LOW (ref 13.0–17.0)
Lymphocytes Relative: 9 %
Lymphs Abs: 0.9 10*3/uL (ref 0.7–4.0)
MCH: 32 pg (ref 26.0–34.0)
MCHC: 35 g/dL (ref 30.0–36.0)
MCV: 91.5 fL (ref 78.0–100.0)
MONO ABS: 0.5 10*3/uL (ref 0.1–1.0)
Monocytes Relative: 5 %
NEUTROS ABS: 8.2 10*3/uL — AB (ref 1.7–7.7)
Neutrophils Relative %: 83 %
PLATELETS: 291 10*3/uL (ref 150–400)
RBC: 3.53 MIL/uL — ABNORMAL LOW (ref 4.22–5.81)
RDW: 13 % (ref 11.5–15.5)
WBC: 9.9 10*3/uL (ref 4.0–10.5)

## 2016-12-17 LAB — LIPID PANEL
Cholesterol: 148 mg/dL (ref 0–200)
HDL: 30 mg/dL — AB (ref >40)
LDL CALC: 96 mg/dL (ref 0–99)
Total CHOL/HDL Ratio: 4.9 RATIO
Triglycerides: 110 mg/dL (ref <150)
VLDL: 22 mg/dL (ref 0–40)

## 2016-12-17 LAB — GLUCOSE, CAPILLARY: GLUCOSE-CAPILLARY: 169 mg/dL — AB (ref 65–99)

## 2016-12-17 LAB — MAGNESIUM: MAGNESIUM: 1.7 mg/dL (ref 1.7–2.4)

## 2016-12-17 MED ORDER — POTASSIUM CHLORIDE CRYS ER 20 MEQ PO TBCR
40.0000 meq | EXTENDED_RELEASE_TABLET | Freq: Once | ORAL | Status: AC
Start: 2016-12-17 — End: 2016-12-17
  Administered 2016-12-17: 40 meq via ORAL
  Filled 2016-12-17: qty 2

## 2016-12-17 MED ORDER — FLORANEX PO PACK: 1.0000 g | PACK | Freq: Three times a day (TID) | ORAL | 0 refills | Status: AC

## 2016-12-17 MED ORDER — CEFPODOXIME PROXETIL 200 MG PO TABS: 200.0000 mg | ORAL_TABLET | Freq: Two times a day (BID) | ORAL | 0 refills | Status: AC

## 2016-12-17 NOTE — Progress Notes (Signed)
SATURATION QUALIFICATIONS: (This note is used to comply with regulatory documentation for home oxygen)    Patient Saturations on Room Air while Ambulating = 89-90%

## 2016-12-17 NOTE — Discharge Summary (Signed)
Discharge Summary  Aaron Blair ZOX:[id number] DOB: 26-Nov-1933  PCP: Pearson Grippe, MD  Admit date: 12/15/2016 Discharge date: 12/17/2016  Time spent: <58mins  Recommendations for Outpatient Follow-up:  1. F/u with PMD within a week  for hospital discharge follow up, repeat cbc/bmp at follow up 2. -pmd to repeat cxr in 4weeks to ensure resolution of pneumonia  Discharge Diagnoses:  Active Hospital Problems   Diagnosis Date Noted  . CAP (community acquired pneumonia) 12/15/2016  . Diabetes mellitus (HCC) 12/15/2016  . Hyponatremia 12/15/2016  . Hypokalemia 12/15/2016  . Hypoxia 12/15/2016    Resolved Hospital Problems   Diagnosis Date Noted Date Resolved  No resolved problems to display.    Discharge Condition: stable  Diet recommendation: heart healthy/carb modified  Filed Weights   12/15/16 1156 12/15/16 1614  Weight: 67.1 kg (147 lb 14.4 oz) 67.1 kg (147 lb 14.4 oz)    History of present illness:  PCP: Pearson Grippe, MD  Patient coming from: Home  Chief Complaint: Fever and chills  HPI: Aaron Blair is a 83 y.o. male with medical history significant of hypertension, hyperlipidemia, type 2 diabetes mellitus, presents to the emergency room with chief complaint of fever and chills over the last week.  Patient knows a little [place] but his native language is [place], per his preference his son is interpreting.  He tells me that he has been having upper respiratory type symptoms, as well as fever and chills over the last week.  He saw his PCP about 3 days ago, and he was given an antibiotic, however despite the antibiotic he has not gotten any better.  Is also been complaining of poor appetite and poor p.o. intake over the last several days, and progressive generalized weakness.  Every time he gets fever, he has been having diffuse muscle aches.  He also is complaining of nausea without vomiting.  He denies any diarrhea.  He has no chest pain.  ED Course: In the emergency room his  vital signs are stable, he is afebrile, normotensive, his blood work shows a sodium of 128, potassium of 2.7, his initial lactic acid was 2.4 and improved to 1.3 after fluid resuscitation.  Chest x-ray with diffuse left lung airspace disease.  He was started on ceftriaxone and azithromycin.  TRH was asked for admission.  Hospital Course:  Active Problems:   CAP (community acquired pneumonia)   Diabetes mellitus (HCC)   Hyponatremia   Hypokalemia   Hypoxia  Acute hypoxic respiratory failure, left lobar pneumonia, mild sepsis on admission with sinus tachycardia hr 105, lactic acidosis, fever at home, he required 2liter oxygen supplement initially Failed outpatient abx treatment with levaquin cxr  On admission "Diffuse left lung airspace disease likely representing pneumonia. Cardiomegaly. " Negative flu, respiratory panel negative, urine strep pneumo negative, mrsa screening negative, sputum sample not collected, cough resovled,  speech eval "Regular;Thin liquid   Liquid Administration via: Cup;Straw Medication Administration: Whole meds with liquid Supervision: Patient able to self feed Compensations: Slow rate;Small sips/bites   He received rocephin/zithro with clinical improvement, he is weaned off oxygen, he is discharged on vantin for 5 days.  Metabolic encephalopathy:  Son concerned that patient is confused, request brain imagine, explained to patient and son, slight confusion likely from metabolic encephalopathy from infection and electrolyte abnormalities Confusion resolved.  Hyponatremia: sodium 128 on admission, likely from poor oral intake, dehydration, sodium improved with hydration , today 134 ,  Hypokalemia, from poor oral intake, k 2.7 on admission, mag 1.7  Replace k  nonsinulin dependent dm2:  a1c pending, home oral meds held, on ssi   HTN: continue home meds norvasc, hold vlasartan /hctz in the setting of volume depletion resume home bp meds at  discharge  h/o carotid stenosis, s/p left CEA in 07/2016 by Dr Darrick Penna , continue asa  Code Status: full  Family Communication: patient , son updated over the phone  Disposition Plan: home    Consultants:  none  Procedures:  none  Antibiotics: Rocephin/zithro   Discharge Exam: BP (!) 163/82 (BP Location: Left Arm)   Pulse (!) 102   Temp 99.1 F (37.3 C) (Oral)   Resp 18   Ht 5\' 4"  (1.626 m)   Wt 67.1 kg (147 lb 14.4 oz)   SpO2 93%   BMI 25.39 kg/m   General: * Cardiovascular: * Respiratory: *  Discharge Instructions You were cared for by a hospitalist during your hospital stay. If you have any questions about your discharge medications or the care you received while you were in the hospital after you are discharged, you can call the unit and asked to speak with the hospitalist on call if the hospitalist that took care of you is not available. Once you are discharged, your primary care physician will handle any further medical issues. Please note that NO REFILLS for any discharge medications will be authorized once you are discharged, as it is imperative that you return to your primary care physician (or establish a relationship with a primary care physician if you do not have one) for your aftercare needs so that they can reassess your need for medications and monitor your lab values.  Discharge Instructions    Diet - low sodium heart healthy    Complete by:  As directed    Carb modified   Increase activity slowly    Complete by:  As directed      Allergies as of 12/17/2016      Reactions   No Known Allergies       Medication List    STOP taking these medications   cephALEXin 500 MG capsule Commonly known as:  KEFLEX   levofloxacin 500 MG tablet Commonly known as:  LEVAQUIN   metoCLOPramide 10 MG tablet Commonly known as:  REGLAN     TAKE these medications   amLODipine 5 MG tablet Commonly known as:  NORVASC Take 5 mg by mouth daily.    aspirin 81 MG tablet Take 81 mg by mouth daily.   cefpodoxime 200 MG tablet Commonly known as:  VANTIN Take 1 tablet (200 mg total) by mouth 2 (two) times daily.   FISH OIL PO Take 1 capsule by mouth daily.   glimepiride 2 MG tablet Commonly known as:  AMARYL Take 2 mg by mouth daily with breakfast.   JANUMET XR [phone] MG Tb24 Generic drug:  SitaGLIPtin-MetFORMIN HCl Take 1 tablet by mouth daily.   lactobacillus Pack Take 1 packet (1 g total) by mouth 3 (three) times daily with meals.   multivitamin with minerals Tabs tablet Take 1 tablet by mouth daily.   oxyCODONE-acetaminophen 5-325 MG tablet Commonly known as:  PERCOCET/ROXICET Take 1 tablet by mouth every 6 (six) hours as needed for moderate pain.   pioglitazone 30 MG tablet Commonly known as:  ACTOS Take 30 mg by mouth daily.   TIGER BALM MUSCLE RUB 10-14-13 % Crea Generic drug:  Camphor-Menthol-Methyl Sal Apply 1 application topically daily as needed (pain/aches).   valsartan-hydrochlorothiazide 320-25 MG tablet Commonly known  as:  DIOVAN-HCT Take 1 tablet by mouth daily.      Allergies  Allergen Reactions  . No Known Allergies    Follow-up Information    Pearson Grippe, MD Follow up in 1 week(s).   Specialty:  Internal Medicine Why:  for hospital discharge follow up, repeat cbc/bmp at follow up in one week. pmd to repeat cxr in 4weeks to ensure resolution of pneumonia Contact information: [address] [place] [address] [phone]            The results of significant diagnostics from this hospitalization (including imaging, microbiology, ancillary and laboratory) are listed below for reference.    Significant Diagnostic Studies: Dg Chest 2 View  Result Date: 12/15/2016 CLINICAL DATA:  83 year old male with cough and fever. EXAM: CHEST  2 VIEW COMPARISON:  None. FINDINGS: Cardiomegaly noted. Diffuse left lung airspace disease likely represents pneumonia. The right lung is  clear. There is no evidence of pleural effusion or pneumothorax. No acute bony abnormalities are identified. IMPRESSION: Diffuse left lung airspace disease likely representing pneumonia. Cardiomegaly. Electronically Signed   By: Harmon Pier M.D.   On: 12/15/2016 13:11    Microbiology: Recent Results (from the past 240 hour(s))  Blood Culture (routine x 2)     Status: None (Preliminary result)   Collection Time: 12/15/16 11:25 AM  Result Value Ref Range Status   Specimen Description BLOOD LEFT HAND  Final   Special Requests   Final    BOTTLES DRAWN AEROBIC AND ANAEROBIC Blood Culture adequate volume   Culture   Final    NO GROWTH < 24 HOURS Performed at [facility], [address], [place], [place] [address]    Report Status PENDING  Incomplete  Blood Culture (routine x 2)     Status: None (Preliminary result)   Collection Time: 12/15/16 12:05 PM  Result Value Ref Range Status   Specimen Description BLOOD LEFT ARM  Final   Special Requests   Final    BOTTLES DRAWN AEROBIC AND ANAEROBIC Blood Culture adequate volume   Culture   Final    NO GROWTH < 24 HOURS Performed at [facility], [address] [place], [place] [address]    Report Status PENDING  Incomplete  Respiratory Panel by PCR     Status: None   Collection Time: 12/16/16 10:30 AM  Result Value Ref Range Status   Adenovirus NOT DETECTED NOT DETECTED Final   Coronavirus 229E NOT DETECTED NOT DETECTED Final   Coronavirus HKU1 NOT DETECTED NOT DETECTED Final   Coronavirus NL63 NOT DETECTED NOT DETECTED Final   Coronavirus OC43 NOT DETECTED NOT DETECTED Final   Metapneumovirus NOT DETECTED NOT DETECTED Final   Rhinovirus / Enterovirus NOT DETECTED NOT DETECTED Final   Influenza A NOT DETECTED NOT DETECTED Final   Influenza B NOT DETECTED NOT DETECTED Final   Parainfluenza Virus 1 NOT DETECTED NOT DETECTED Final   Parainfluenza Virus 2 NOT DETECTED NOT DETECTED Final   Parainfluenza Virus 3 NOT DETECTED NOT  DETECTED Final   Parainfluenza Virus 4 NOT DETECTED NOT DETECTED Final   Respiratory Syncytial Virus NOT DETECTED NOT DETECTED Final   Bordetella pertussis NOT DETECTED NOT DETECTED Final   Chlamydophila pneumoniae NOT DETECTED NOT DETECTED Final   Mycoplasma pneumoniae NOT DETECTED NOT DETECTED Final    Comment: Performed at [facility], [address], [place], [place] [address]  MRSA PCR Screening     Status: None  Collection Time: 12/16/16 11:51 AM  Result Value Ref Range Status   MRSA by PCR NEGATIVE NEGATIVE Final    Comment:        The GeneXpert MRSA Assay (FDA approved for NASAL specimens only), is one component of a comprehensive MRSA colonization surveillance program. It is not intended to diagnose MRSA infection nor to guide or monitor treatment for MRSA infections.      Labs: Basic Metabolic Panel:  Recent Labs Lab 12/15/16 1129 12/16/16 0536 12/17/16 0706  NA 128* 134* 134*  K 2.7* 3.3* 3.6  CL 90* 102 101  CO2 28 25 24   GLUCOSE 347* 166* 171*  BUN 21* 12 8  CREATININE 0.96 0.68 0.57*  CALCIUM 8.2* 7.5* 7.9*  MG  --   --  1.7   Liver Function Tests:  Recent Labs Lab 12/15/16 1129 12/17/16 0706  AST 48* 27  ALT 48 33  ALKPHOS 63 61  BILITOT 0.6 0.7  PROT 6.1* 5.6*  ALBUMIN 2.3* 2.2*   No results for input(s): LIPASE, AMYLASE in the last 168 hours. No results for input(s): AMMONIA in the last 168 hours. CBC:  Recent Labs Lab 12/15/16 1129 12/16/16 0536 12/17/16 0706  WBC 8.3 8.4 9.9  NEUTROABS 7.0  --  8.2*  HGB 12.8* 10.7* 11.3*  HCT 36.2* 30.7* 32.3*  MCV 91.2 91.1 91.5  PLT 243 248 291   Cardiac Enzymes: No results for input(s): CKTOTAL, CKMB, CKMBINDEX, TROPONINI in the last 168 hours. BNP: BNP (last 3 results) No results for input(s): BNP in the last 8760 hours.  ProBNP (last 3 results) No results for input(s): PROBNP in the last 8760 hours.  CBG:  Recent Labs Lab 12/16/16 0715 12/16/16 1151 12/16/16 1716  12/16/16 2014 12/17/16 0732  GLUCAP 170* 227* 292* 332* 169*       Signed:  Stephfon Bovey MD, PhD  [facility] 12/17/2016, 11:28 AM

## 2016-12-17 NOTE — Evaluation (Signed)
Clinical/Bedside Swallow Evaluation Patient Details  Name: Aaron Blair MRN: [id number] Date of Birth: [birth year]  Today's Date: 12/17/2016 Time: SLP Start Time (ACUTE ONLY): 0802 SLP Stop Time (ACUTE ONLY): 0816 SLP Time Calculation (min) (ACUTE ONLY): 14 min  Past Medical History:  Past Medical History:  Diagnosis Date  . Chronic gastritis   . Diabetes (HCC)    Type II  . Hyperlipidemia   . Hypertension   . Lumbar herniated disc    L3-4  . Vertigo    Past Surgical History:  Past Surgical History:  Procedure Laterality Date  . BACK SURGERY    . COLONOSCOPY    . ENDARTERECTOMY Left 07/17/2016   Procedure: ENDARTERECTOMY CAROTID;  Surgeon: Sherren Kernsharles E Fields, MD;  [facility];  Service: Vascular;  Laterality: Left;  . No past surgery    . SPINE SURGERY  02/05/2016   Dr. Audrie LiaNunkuma   HPI:  83 yo male [facility], diagnosed with diffuse pna - CAP.  PMH + for DM, gastritis, nausea prior to admission but no vomiting.       Assessment / Plan / Recommendation Clinical Impression  Pt presents with minimal oral dysphagia, oral pocketing on left lateral sulci that pt effectively cleared.  No focal CN deficits however.  Pt does admit to taste changes and was observed to hiccup frequently during evaluation.   Pt denies regurgitation sensation with belching.  No indication of airway compromise and pt self fed himself slowly.   3 ounce Yale water test passed without difficulties.  Recommend continue regular/thin diet.   SLP Visit Diagnosis: Dysphagia, oral phase (R13.11)    Aspiration Risk  Mild aspiration risk    Diet Recommendation Regular;Thin liquid   Liquid Administration via: Cup;Straw Medication Administration: Whole meds with liquid Supervision: Patient able to self feed Compensations: Slow rate;Small sips/bites    Other  Recommendations Oral Care Recommendations: Oral care BID   Follow up Recommendations None      Frequency and Duration     n/a        Prognosis   n/a     Swallow Study   General Date of Onset: 12/17/16 HPI: 83 yo male [facility], diagnosed with diffuse pna - CAP.  PMH + for DM, gastritis, nausea prior to admission but no vomiting.     Type of Study: Bedside Swallow Evaluation Diet Prior to this Study: Regular;Thin liquids Temperature Spikes Noted: Yes (low grade) Respiratory Status: Room air History of Recent Intubation: No Behavior/Cognition: Alert;Cooperative;Pleasant mood Oral Cavity Assessment: Within Functional Limits Oral Care Completed by SLP: No (pt eating breakfast) Oral Cavity - Dentition: Adequate natural dentition Vision: Functional for self-feeding Self-Feeding Abilities: Able to feed self Patient Positioning: Upright in bed Baseline Vocal Quality: Normal Volitional Cough: Strong Volitional Swallow: Able to elicit    Oral/Motor/Sensory Function Overall Oral Motor/Sensory Function: Within functional limits   Ice Chips Ice chips: Not tested   Thin Liquid Thin Liquid: Within functional limits Presentation: Cup;Straw    Nectar Thick Nectar Thick Liquid: Not tested   Honey Thick Honey Thick Liquid: Not tested   Puree Puree: Not tested   Solid   GO   Solid: Impaired Presentation: Self Fed Oral Phase Functional Implications: Left lateral sulci pocketing Other Comments: prolonged but effective mastication, oral pocketing on left - use of liquids        [name], Misa Fedorko Ann Porschia Willbanks, MS [facility] SLP [phone]

## 2016-12-17 NOTE — Progress Notes (Signed)
Date: Dec 17, 2016  Discharge orders review for case management needs.  None found  Marcelle Smilinghonda Davis, BSN, [place] CityRN3, [place]CCM:  [phone]

## 2016-12-18 LAB — HEMOGLOBIN A1C
HEMOGLOBIN A1C: 8.1 % — AB (ref 4.8–5.6)
MEAN PLASMA GLUCOSE: 186 mg/dL

## 2016-12-20 LAB — CULTURE, BLOOD (ROUTINE X 2)
CULTURE: NO GROWTH
Culture: NO GROWTH
SPECIAL REQUESTS: ADEQUATE
Special Requests: ADEQUATE

## 2016-12-23 DIAGNOSIS — K219 Gastro-esophageal reflux disease without esophagitis: Secondary | ICD-10-CM | POA: Diagnosis not present

## 2016-12-23 DIAGNOSIS — J189 Pneumonia, unspecified organism: Secondary | ICD-10-CM | POA: Diagnosis not present

## 2016-12-23 DIAGNOSIS — R413 Other amnesia: Secondary | ICD-10-CM | POA: Diagnosis not present

## 2016-12-23 DIAGNOSIS — R079 Chest pain, unspecified: Secondary | ICD-10-CM | POA: Diagnosis not present

## 2016-12-23 DIAGNOSIS — E1165 Type 2 diabetes mellitus with hyperglycemia: Secondary | ICD-10-CM | POA: Diagnosis not present

## 2016-12-23 DIAGNOSIS — I10 Essential (primary) hypertension: Secondary | ICD-10-CM | POA: Diagnosis not present

## 2016-12-24 ENCOUNTER — Other Ambulatory Visit: Payer: Self-pay | Admitting: Pharmacist

## 2016-12-24 NOTE — Patient Outreach (Signed)
[facility] Care Management  [facility]   12/24/2016  Deloria LairMyung B Diana 06-07-1934 [id number]  Subjective: Patient is an 83 year old male referred for medication review [facility]. He was hospitalized 5/6-5/8 for pneumonia.   Medication reconciliation was performed Friday 5/11 with his son, Emelda FearJae. Jae reports that his father takes care of his own medications and fills his own pillbox but father has limited english. Jae was able to verbalize that his father no longer takes the multivitamin or fish oil, and that his father never received a prescription for Percocet. Jae's only concern today was his father's hiccups, which have been ongoing for the past 3 weeks.  Emelda FearJae states that his father is checking his blood glucose daily and hasn't complained of any lows. Jae said he was planning to replace the batteries in his father's blood pressure monitor tonight and begin checking his blood pressure daily. Reports no issues affording medications at this time. Patient had a follow up appointment Monday 5/14 with his PCP where he planned to discuss the Percocet prescription and hiccups.  Patient was recently discharged from hospital and all medications have been reviewed.  Objective:   Lipid Panel     Component Value Date/Time   CHOL 148 12/17/2016 0706   TRIG 110 12/17/2016 0706   HDL 30 (L) 12/17/2016 0706   CHOLHDL 4.9 12/17/2016 0706   VLDL 22 12/17/2016 0706   LDLCALC 96 12/17/2016 0706    Encounter Medications: Outpatient Encounter Prescriptions as of 12/24/2016  Medication Sig  . amLODipine (NORVASC) 5 MG tablet Take 5 mg by mouth daily.  Marland Kitchen. aspirin 81 MG tablet Take 81 mg by mouth daily.  . Camphor-Menthol-Methyl Sal (TIGER BALM MUSCLE RUB) 10-14-13 % CREA Apply 1 application topically daily as needed (pain/aches).   Marland Kitchen. glimepiride (AMARYL) 2 MG tablet Take 2 mg by mouth daily with breakfast.  . lactobacillus (FLORANEX/LACTINEX) PACK Take 1 packet  (1 g total) by mouth 3 (three) times daily with meals.  . pioglitazone (ACTOS) 30 MG tablet Take 30 mg by mouth daily.  . SitaGLIPtin-MetFORMIN HCl (JANUMET XR) [phone] MG TB24 Take 1 tablet by mouth daily.   . valsartan-hydrochlorothiazide (DIOVAN-HCT) 320-25 MG per tablet Take 1 tablet by mouth daily.  Marland Kitchen. oxyCODONE-acetaminophen (PERCOCET/ROXICET) 5-325 MG tablet Take 1 tablet by mouth every 6 (six) hours as needed for moderate pain. (Patient not taking: Reported on 12/15/2016)  . [DISCONTINUED] Multiple Vitamin (MULTIVITAMIN WITH MINERALS) TABS tablet Take 1 tablet by mouth daily.  . [DISCONTINUED] Omega-3 Fatty Acids (FISH OIL PO) Take 1 capsule by mouth daily.   No facility-administered encounter medications on file as of 12/24/2016.     Functional Status: In your present state of health, do you have any difficulty performing the following activities: 12/15/2016 07/17/2016  Hearing? N N  Vision? N N  Difficulty concentrating or making decisions? N N  Walking or climbing stairs? N N  Dressing or bathing? N N  Doing errands, shopping? N N  Some recent data might be hidden   Assessment: Drugs sorted by system:   Cardiovascular: amlodipine, aspirin, Diovan-HCT Gastrointestinal: lactobacillus Endocrine: glimepiride, Actos, Janumet XR Pain: Tiger Balm Muscle Rub  Duplications in therapy: None Gaps in therapy: Patient not currently receiving a statin. Medications to avoid in the elderly: None Drug interactions: None Other issues noted: 12/17/16 A1c 8.1%, uncontrolled diabetes.   Plan: [facility] pharmacist performed 30 day post discharge med reconciliation with patient  -Patient has diabetes and not  currently on a statin.  Will contact primary care provider to consider prescribing a moderate intensity statin.   [facility] pharmacy will not open case at this time  Hazle Nordmann, PharmD, BCPS [facility] PGY2 Pharmacy Resident [phone] Patient seen with Josiah Lobo, PharmD candidate

## 2016-12-25 DIAGNOSIS — E118 Type 2 diabetes mellitus with unspecified complications: Secondary | ICD-10-CM | POA: Diagnosis not present

## 2016-12-25 DIAGNOSIS — I10 Essential (primary) hypertension: Secondary | ICD-10-CM | POA: Diagnosis not present

## 2016-12-25 DIAGNOSIS — E782 Mixed hyperlipidemia: Secondary | ICD-10-CM | POA: Diagnosis not present

## 2016-12-25 DIAGNOSIS — R0789 Other chest pain: Secondary | ICD-10-CM | POA: Diagnosis not present

## 2017-01-09 DIAGNOSIS — R0602 Shortness of breath: Secondary | ICD-10-CM | POA: Diagnosis not present

## 2017-01-09 DIAGNOSIS — R0789 Other chest pain: Secondary | ICD-10-CM | POA: Diagnosis not present

## 2017-01-24 ENCOUNTER — Other Ambulatory Visit: Payer: Self-pay | Admitting: Internal Medicine

## 2017-01-24 ENCOUNTER — Encounter: Payer: Self-pay | Admitting: Vascular Surgery

## 2017-01-24 DIAGNOSIS — E119 Type 2 diabetes mellitus without complications: Secondary | ICD-10-CM | POA: Diagnosis not present

## 2017-01-24 DIAGNOSIS — I10 Essential (primary) hypertension: Secondary | ICD-10-CM | POA: Diagnosis not present

## 2017-01-24 DIAGNOSIS — J189 Pneumonia, unspecified organism: Secondary | ICD-10-CM

## 2017-02-03 DIAGNOSIS — I10 Essential (primary) hypertension: Secondary | ICD-10-CM | POA: Diagnosis not present

## 2017-02-03 DIAGNOSIS — I358 Other nonrheumatic aortic valve disorders: Secondary | ICD-10-CM | POA: Diagnosis not present

## 2017-02-03 DIAGNOSIS — Z9889 Other specified postprocedural states: Secondary | ICD-10-CM | POA: Diagnosis not present

## 2017-02-03 DIAGNOSIS — R0789 Other chest pain: Secondary | ICD-10-CM | POA: Diagnosis not present

## 2017-02-05 ENCOUNTER — Ambulatory Visit (INDEPENDENT_AMBULATORY_CARE_PROVIDER_SITE_OTHER): Payer: Medicare Other

## 2017-02-05 ENCOUNTER — Encounter: Payer: Self-pay | Admitting: Family Medicine

## 2017-02-05 ENCOUNTER — Ambulatory Visit (INDEPENDENT_AMBULATORY_CARE_PROVIDER_SITE_OTHER): Payer: Medicare Other | Admitting: Family Medicine

## 2017-02-05 VITALS — BP 164/96 | HR 84 | Temp 98.0°F | Wt 149.6 lb

## 2017-02-05 DIAGNOSIS — E782 Mixed hyperlipidemia: Secondary | ICD-10-CM | POA: Diagnosis not present

## 2017-02-05 DIAGNOSIS — M47816 Spondylosis without myelopathy or radiculopathy, lumbar region: Secondary | ICD-10-CM | POA: Diagnosis not present

## 2017-02-05 DIAGNOSIS — I10 Essential (primary) hypertension: Secondary | ICD-10-CM

## 2017-02-05 DIAGNOSIS — M545 Low back pain, unspecified: Secondary | ICD-10-CM

## 2017-02-05 DIAGNOSIS — E119 Type 2 diabetes mellitus without complications: Secondary | ICD-10-CM | POA: Diagnosis not present

## 2017-02-05 DIAGNOSIS — I6522 Occlusion and stenosis of left carotid artery: Secondary | ICD-10-CM | POA: Diagnosis not present

## 2017-02-05 LAB — COMPREHENSIVE METABOLIC PANEL
ALT: 14 U/L (ref 0–53)
AST: 18 U/L (ref 0–37)
Albumin: 3.9 g/dL (ref 3.5–5.2)
Alkaline Phosphatase: 47 U/L (ref 39–117)
BUN: 25 mg/dL — ABNORMAL HIGH (ref 6–23)
CO2: 28 mEq/L (ref 19–32)
Calcium: 9.6 mg/dL (ref 8.4–10.5)
Chloride: 102 mEq/L (ref 96–112)
Creatinine, Ser: 0.87 mg/dL (ref 0.40–1.50)
GFR: 89.49 mL/min (ref >60.00)
Glucose, Bld: 161 mg/dL — ABNORMAL HIGH (ref 70–99)
Potassium: 3.8 mEq/L (ref 3.5–5.1)
Sodium: 135 mEq/L (ref 135–145)
Total Bilirubin: 0.6 mg/dL (ref 0.2–1.2)
Total Protein: 6.9 g/dL (ref 6.0–8.3)

## 2017-02-05 LAB — CBC WITH DIFFERENTIAL/PLATELET
Basophils Absolute: 0 10*3/uL (ref 0.0–0.1)
Basophils Relative: 0.6 % (ref 0.0–3.0)
Eosinophils Absolute: 0.3 10*3/uL (ref 0.0–0.7)
Eosinophils Relative: 5.2 % — ABNORMAL HIGH (ref 0.0–5.0)
HCT: 41.4 % (ref 39.0–52.0)
Hemoglobin: 13.9 g/dL (ref 13.0–17.0)
Lymphocytes Relative: 33.9 % (ref 12.0–46.0)
Lymphs Abs: 2 10*3/uL (ref 0.7–4.0)
MCHC: 33.6 g/dL (ref 30.0–36.0)
MCV: 95.6 fl (ref 78.0–100.0)
Monocytes Absolute: 0.4 10*3/uL (ref 0.1–1.0)
Monocytes Relative: 6.6 % (ref 3.0–12.0)
Neutro Abs: 3.2 10*3/uL (ref 1.4–7.7)
Neutrophils Relative %: 53.7 % (ref 43.0–77.0)
Platelets: 198 10*3/uL (ref 150.0–400.0)
RBC: 4.33 Mil/uL (ref 4.22–5.81)
RDW: 14.1 % (ref 11.5–15.5)
WBC: 6 10*3/uL (ref 4.0–10.5)

## 2017-02-05 MED ORDER — TRAMADOL HCL 50 MG PO TABS: 50.0000 mg | ORAL_TABLET | Freq: Three times a day (TID) | ORAL | 0 refills | Status: DC | PRN

## 2017-02-05 NOTE — Progress Notes (Signed)
Aaron Blair is a 83 y.o. male is [facility].   Patient Care Team: Aaron [facility], James, MD as PCP - General (Internal Medicine) Aaron [facility], Aaron [facility], Aaron Blair as Aaron [facility] (Pharmacist)   History of Present Illness:   Aaron BottomJamie Blair CMA acting as scribe for [facility].  Back Pain  This is a new problem. The current episode started 1 to 4 weeks ago. The problem occurs constantly. The problem is unchanged. The pain is present in the lumbar spine. The quality of the pain is described as aching. The pain does not radiate. The pain is moderate. The pain is the same all the time. The symptoms are aggravated by bending. Pertinent negatives include no abdominal pain, bladder incontinence, bowel incontinence, dysuria, leg pain, numbness, paresthesias, pelvic pain, perianal numbness, tingling Blair weakness. Risk factors include recent trauma. He has tried ice and analgesics for the symptoms. The treatment provided mild relief.   Health Maintenance Due  Topic Date Due  . FOOT EXAM  12/23/1943  . OPHTHALMOLOGY EXAM  12/23/1943  . TETANUS/TDAP  12/24/1954  . PNA vac Low Risk Adult (1 of 2 - PCV13) 12/23/2000   PMHx, SurgHx, SocialHx, Medications, and Allergies were reviewed in the Visit Navigator and updated as appropriate.   Past Medical History:  Diagnosis Date  . Chronic gastritis   . Diabetes type 2, uncontrolled (Aaron [facility])   . Hyperlipidemia   . Hypertension   . Lumbar herniated disc, L3-4   . Vertigo    Past Surgical History:  Procedure Laterality Date  . BACK SURGERY    . COLONOSCOPY    . ENDARTERECTOMY Left 07/17/2016   Procedure: ENDARTERECTOMY CAROTID;  Surgeon: Aaron Kernsharles Blair Fields, MD;  Location: [facility];  Service: Vascular;  Laterality: Left;  . SPINE SURGERY  02/05/2016   No family history on file.   Social History  Substance Use Topics  . Smoking status: Former Smoker    Years: 60.00    Quit date: 06/28/2011  . Smokeless tobacco: Never Used     Comment:  Quit 3 years ago.  . Alcohol use No   Current Medications and Allergies:   .  aspirin 81 MG tablet, Take 81 mg by mouth daily., Disp: , Rfl:  .  Aspirin-Caffeine (BAYER BACK & BODY) 500-32.5 MG TABS, Take 1 tablet by mouth., Disp: , Rfl:  .  Camphor-Menthol-Methyl Sal (TIGER BALM MUSCLE RUB) 10-14-13 % CREA, Apply 1 application topically daily as needed (pain/aches). , Disp: , Rfl:  .  glimepiride (AMARYL) 2 MG tablet, Take 2 mg by mouth daily with breakfast., Disp: , Rfl:  .  pioglitazone (ACTOS) 30 MG tablet, Take 30 mg by mouth daily., Disp: , Rfl:  .  Pitavastatin Calcium (LIVALO) 4 MG TABS, Take 1 tablet by mouth daily., Disp: , Rfl:  .  SitaGLIPtin-MetFORMIN HCl (JANUMET XR) [phone] MG TB24, Take 1 tablet by mouth daily. , Disp: , Rfl:  .  valsartan-hydrochlorothiazide (DIOVAN-HCT) 320-25 MG per tablet, Take 1 tablet by mouth daily., Disp: , Rfl:   Allergies  Allergen Reactions  . No Known Allergies    Review of Systems:   Review of Systems  Gastrointestinal: Negative for abdominal pain and bowel incontinence.  Genitourinary: Negative for bladder incontinence, dysuria and pelvic pain.  Musculoskeletal: Positive for back pain.  Neurological: Negative for tingling, weakness, numbness and paresthesias.  All other systems reviewed and are negative.  Vitals:   Vitals:   02/05/17 1056  BP: (!) 164/96  Pulse: 84  Temp: 98 F (36.7 C)  TempSrc: Oral  SpO2: 92%  Weight: 149 lb 9.6 oz (67.9 kg)     Body mass index is 25.68 kg/m.  Physical Exam:   Physical Exam  Constitutional: He is oriented to person, place, and time. He appears well-developed and well-nourished.  HENT:  Head: Normocephalic and atraumatic.  Right Ear: External ear normal.  Left Ear: External ear normal.  Nose: Nose normal.  Mouth/Throat: Oropharynx is clear and moist.  Eyes: Conjunctivae and EOM are normal. Pupils are equal, round, and reactive to light.  Neck: Normal range of motion. Neck supple.    Cardiovascular: Normal rate, regular rhythm and intact distal pulses.   Pulmonary/Chest: Effort normal and breath sounds normal.  Abdominal: Soft. Bowel sounds are normal.  Musculoskeletal:       Back:  Nonpitting edema bilateral LE extremity.  Neurological: He is alert and oriented to person, place, and time.  Skin: Skin is warm and dry.  Psychiatric: He has a normal mood and affect. His behavior is normal. Judgment and thought content normal.  Nursing note and vitals reviewed.  Assessment and Plan:   Aaron Blair was seen today for establish care and back pain.  Diagnoses and all orders for this visit:  Acute midline low back pain without sciatica Comments: New. Consistent with lumbar strain. Xray with degenerative changes noted. Continue lumbar brace for one week. Rx Tramadol to be alternated with Tylenol.  Orders: -     DG Lumbar Spine 2-3 Views; Future -     traMADol (ULTRAM) 50 MG tablet; Take 1 tablet (50 mg total) by mouth every 8 (eight) hours as needed.  Essential hypertension Comments: Elevated today. In pain. Otherwise, no symptoms. Instructed to keep BP journal. Send report via MyChart in 2 weeks.  Type 2 diabetes mellitus without complication, without long-term current use of insulin (Aaron [facility]) Comments: Recent elevation in A1c. Reviewed importance of low simple carbohydrate diet. Already walking throughout the day at work. Goal < 8.  Orders: -     CBC with Differential/Platelet -     Comprehensive metabolic panel  Mixed hyperlipidemia Comments: Statin increased 2 days ago by Dr. Jacinto Blair. Echo recently WNL. Carotid doppler scheduled.  Left carotid stenosis Comments: Followed by Dr. Jacinto Blair as above.   . Reviewed expectations re: course of current medical issues. . Discussed self-Blair of symptoms. . Outlined signs and symptoms indicating need for more acute intervention. . Patient verbalized understanding and all questions were answered. [facility] Kitchen Health Maintenance  issues including appropriate healthy diet, exercise, and smoking avoidance were discussed with patient. . See orders for this visit as documented in the electronic medical record. . Patient received an After Visit Summary.  CMA served as Neurosurgeon during this visit. History, Physical, and Plan performed by medical provider. The above documentation has been reviewed and is accurate and complete. Aaron [facility], D.O.  Aaron Rima, DO [place] [place] 02/05/2017  Future Appointments Date Time Provider Department Center  05/08/2017 11:30 AM Aaron Rima, DO [facility] None

## 2017-02-06 ENCOUNTER — Encounter (HOSPITAL_COMMUNITY): Payer: Medicare Other

## 2017-02-06 ENCOUNTER — Ambulatory Visit: Payer: Medicare Other | Admitting: Vascular Surgery

## 2017-02-06 ENCOUNTER — Ambulatory Visit (HOSPITAL_COMMUNITY): Payer: Medicare Other

## 2017-02-06 NOTE — Progress Notes (Signed)
Please let the patient know that his labs are normal with the exception of elevated glucose. I am reviewing his chart and will likely call to change his BP and/or DM medications slightly. Would he like for me to do this in person or is over the phone okay? If in person - 30 min appointment please (will need interpretor).

## 2017-02-10 ENCOUNTER — Telehealth: Payer: Self-pay | Admitting: Family Medicine

## 2017-02-10 NOTE — Telephone Encounter (Signed)
ROI fax to [facility]

## 2017-02-17 ENCOUNTER — Telehealth: Payer: Self-pay

## 2017-02-17 NOTE — Telephone Encounter (Signed)
Patient's emergency contact calling to ask if patient needs to have an appointment to discuss recent lab results.  Advised that patient should schedule an appointment to discuss results in person.  They will call back to schedule an appointment.

## 2017-02-20 ENCOUNTER — Encounter: Payer: Self-pay | Admitting: Family Medicine

## 2017-02-20 ENCOUNTER — Ambulatory Visit (INDEPENDENT_AMBULATORY_CARE_PROVIDER_SITE_OTHER): Payer: Medicare Other | Admitting: Family Medicine

## 2017-02-20 VITALS — BP 168/96 | HR 84 | Temp 97.9°F | Ht 64.0 in | Wt 151.0 lb

## 2017-02-20 DIAGNOSIS — I10 Essential (primary) hypertension: Secondary | ICD-10-CM | POA: Diagnosis not present

## 2017-02-20 DIAGNOSIS — E782 Mixed hyperlipidemia: Secondary | ICD-10-CM | POA: Diagnosis not present

## 2017-02-20 DIAGNOSIS — E119 Type 2 diabetes mellitus without complications: Secondary | ICD-10-CM

## 2017-02-20 NOTE — Progress Notes (Signed)
Aaron Blair Aaron Blair is a 83 y.o. male is here for follow up.  History of Present Illness:   Aaron BottomJamie Blair CMA acting as scribe for Dr. Earlene PlaterWallace.  Diabetes  He presents for his follow-up diabetic visit. He has type 2 diabetes mellitus. No MedicAlert identification noted. His disease course has been stable. There are no hypoglycemic associated symptoms. Pertinent negatives for hypoglycemia include no dizziness, headaches or nervousness/anxiousness. There are no diabetic associated symptoms. Pertinent negatives for diabetes include no chest pain and no weight loss. There are no hypoglycemic complications. There are no diabetic complications. Risk factors for coronary artery disease include diabetes mellitus. Current diabetic treatment includes oral agent (triple therapy). He is compliant with treatment all of the time. His weight is stable. When asked about meal planning, he reported none. He has not had a previous visit with a dietitian. He rarely participates in exercise. His home blood glucose trend is increasing steadily. He does not see a podiatrist.Eye exam is current.   Hypertension Avoiding excessive salt intake? []   YES  [x]   NO Trying to exercise on a regular basis? []   YES  [x]   NO Review: taking medications as instructed, no medication side effects noted, no TIAs, no chest pain on exertion, no dyspnea on exertion.   Wt Readings from Last 3 Encounters:  02/20/17 151 lb (68.5 kg)  02/05/17 149 lb 9.6 oz (67.9 kg)  12/15/16 147 lb 14.4 oz (67.1 kg)   Reports that he quit smoking about 5 years ago. He quit after 60.00 years of use. He has never used smokeless tobacco.  BP Readings from Last 3 Encounters:  02/20/17 (!) 168/96  02/05/17 (!) 164/96  12/17/16 (!) 163/82   Lab Results  Component Value Date   CREATININE 0.87 02/05/2017   Hyperlipidemia Is the patient taking medications without problems? [x]   YES  []   NO Does the patient complain of muscle aches?   []   YES  [x]    NO Trying  to exercise on a regular basis? []   YES  [x]   NO Diet Compliance: compliant most of the time. Concerns: MEDICATION IS VERY EXPENSIVE Cardiovascular ROS: no chest pain or dyspnea on exertion.   Lipids:    Component Value Date/Time   CHOL 148 12/17/2016 0706   TRIG 110 12/17/2016 0706   HDL 30 (L) 12/17/2016 0706   VLDL 22 12/17/2016 0706   CHOLHDL 4.9 12/17/2016 0706   The ASCVD Risk score (Goff DC Jr., et al., 2013) failed to calculate for the following reasons:   The 2013 ASCVD risk score is only valid for ages 7740 to 5879.  Health Maintenance Due  Topic Date Due  . FOOT EXAM  12/23/1943  . OPHTHALMOLOGY EXAM  12/23/1943  . TETANUS/TDAP  12/24/1954  . PNA vac Low Risk Adult (1 of 2 - PCV13) 12/23/2000   PMHx, SurgHx, SocialHx, FamHx, Medications, and Allergies were reviewed in the Visit Navigator and updated as appropriate.   Patient Active Problem List   Diagnosis Date Noted  . Essential hypertension 12/12/2016  . Mixed hyperlipidemia 12/12/2016  . Type 2 diabetes mellitus without complication, without long-term current use of insulin ([facility]) 12/12/2016  . Left carotid stenosis 07/17/2016   Social History  Substance Use Topics  . Smoking status: Former Smoker    Years: 60.00    Quit date: 06/28/2011  . Smokeless tobacco: Never Used     Comment: Quit 3 years ago.  . Alcohol use No   Current Medications and Allergies:   .  aspirin 81 MG tablet, Take 81 mg by mouth daily., Disp: , Rfl:  .  glimepiride (AMARYL) 2 MG tablet, Take 2 mg by mouth daily with breakfast., Disp: , Rfl:  .  pioglitazone (ACTOS) 30 MG tablet, Take 30 mg by mouth daily., Disp: , Rfl:  .  Pitavastatin Calcium (LIVALO) 4 MG TABS, Take 1 tablet by mouth daily., Disp: , Rfl:  .  SitaGLIPtin-MetFORMIN HCl (JANUMET XR) [phone] MG TB24, Take 1 tablet by mouth daily. , Disp: , Rfl:  .  valsartan-hydrochlorothiazide (DIOVAN-HCT) 320-25 MG per tablet, Take 1 tablet by mouth daily.  Allergies  Allergen  Reactions  . No Known Allergies    Review of Systems   Review of Systems  Constitutional: Negative for chills, fever, malaise/fatigue and weight loss.  Respiratory: Negative for cough, shortness of breath and wheezing.   Cardiovascular: Negative for chest pain, palpitations and leg swelling.  Gastrointestinal: Negative for abdominal pain, constipation, diarrhea, nausea and vomiting.  Genitourinary: Negative for dysuria and urgency.  Musculoskeletal: Negative for joint pain and myalgias.  Skin: Negative for rash.  Neurological: Negative for dizziness and headaches.  Psychiatric/Behavioral: Negative for depression, substance abuse and suicidal ideas. The patient is not nervous/anxious.    Vitals:   Vitals:   02/20/17 1038  BP: (!) 168/96  Pulse: 84  Temp: 97.9 F (36.6 C)  TempSrc: Oral  SpO2: 94%  Weight: 151 lb (68.5 kg)  Height: 5\' 4"  (1.626 m)     Body mass index is 25.92 kg/m.  Physical Exam:   Physical Exam  Constitutional: He is oriented to person, place, and time. He appears well-developed and well-nourished. No distress.  HENT:  Head: Normocephalic and atraumatic.  Right Ear: External ear normal.  Left Ear: External ear normal.  Nose: Nose normal.  Mouth/Throat: Oropharynx is clear and moist.  Eyes: Pupils are equal, round, and reactive to light. Conjunctivae and EOM are normal.  Neck: Normal range of motion. Neck supple.  Cardiovascular: Normal rate, regular rhythm, normal heart sounds and intact distal pulses.   Pulmonary/Chest: Effort normal and breath sounds normal.  Abdominal: Soft. Bowel sounds are normal.  Musculoskeletal: Normal range of motion.  Neurological: He is alert and oriented to person, place, and time.  Skin: Skin is warm and dry.  Psychiatric: He has a normal mood and affect. His behavior is normal. Judgment and thought content normal.  Nursing note and vitals reviewed.   Diabetic Foot Exam - Simple   Simple Foot Form Diabetic Foot exam  was performed with the following findings:  Yes 02/20/2017 10:46 PM  Visual Inspection No deformities, no ulcerations, no other skin breakdown bilaterally:  Yes Sensation Testing Intact to touch and monofilament testing bilaterally:  Yes Pulse Check Posterior Tibialis and Dorsalis pulse intact bilaterally:  Yes Comments    Results for orders placed or performed in visit on 02/05/17  CBC with Differential/Platelet  Result Value Ref Range   WBC 6.0 4.0 - 10.5 K/uL   RBC 4.33 4.22 - 5.81 Mil/uL   Hemoglobin 13.9 13.0 - 17.0 g/dL   HCT 16.1 09.6 - 04.5 %   MCV 95.6 78.0 - 100.0 fl   MCHC 33.6 30.0 - 36.0 g/dL   RDW 40.9 81.1 - 91.4 %   Platelets 198.0 150.0 - 400.0 K/uL   Neutrophils Relative % 53.7 43.0 - 77.0 %   Lymphocytes Relative 33.9 12.0 - 46.0 %   Monocytes Relative 6.6 3.0 - 12.0 %   Eosinophils Relative 5.2 (H) 0.0 -  5.0 %   Basophils Relative 0.6 0.0 - 3.0 %   Neutro Abs 3.2 1.4 - 7.7 K/uL   Lymphs Abs 2.0 0.7 - 4.0 K/uL   Monocytes Absolute 0.4 0.1 - 1.0 K/uL   Eosinophils Absolute 0.3 0.0 - 0.7 K/uL   Basophils Absolute 0.0 0.0 - 0.1 K/uL  Comprehensive metabolic panel  Result Value Ref Range   Sodium 135 135 - 145 mEq/L   Potassium 3.8 3.5 - 5.1 mEq/L   Chloride 102 96 - 112 mEq/L   CO2 28 19 - 32 mEq/L   Glucose, Bld 161 (H) 70 - 99 mg/dL   BUN 25 (H) 6 - 23 mg/dL   Creatinine, Ser 1.61 0.40 - 1.50 mg/dL   Total Bilirubin 0.6 0.2 - 1.2 mg/dL   Alkaline Phosphatase 47 39 - 117 U/L   AST 18 0 - 37 U/L   ALT 14 0 - 53 U/L   Total Protein 6.9 6.0 - 8.3 g/dL   Albumin 3.9 3.5 - 5.2 g/dL   Calcium 9.6 8.4 - 09.6 mg/dL   GFR 04.54 >09.81 mL/min   Assessment and Plan:   Aaron Blair was seen today for diabetes.  Diagnoses and all orders for this visit:  Type 2 diabetes mellitus without complication, without long-term current use of insulin ([facility]) Comments:  Discussed trial of Trulicity. Patient will call his insurance company to see if they cover it.   Lab  Results  Component Value Date   HGBA1C 8.1 (H) 12/17/2016   Mixed hyperlipidemia Comments: Statin is very expensive. Previous complication with another statin - he thinks that it started with an "L". Cardiologist is Dr. Jacinto Halim. Will okay trial of a different, cheaper, medication.  Essential hypertension Comments: Patient is concerned about Valsartan after learning about impurities causing cancer. A review revealed the recall below. Will contact his pharmacy to see if he is affected.      Wt Readings from Last 3 Encounters:  02/20/17 151 lb (68.5 kg)  02/05/17 149 lb 9.6 oz (67.9 kg)  12/15/16 147 lb 14.4 oz (67.1 kg)   BP Readings from Last 3 Encounters:  02/20/17 (!) 168/96  02/05/17 (!) 164/96  12/17/16 (!) 163/82    . Reviewed expectations re: course of current medical issues. . Discussed self-management of symptoms. . Outlined signs and symptoms indicating need for more acute intervention. . Patient verbalized understanding and all questions were answered. Marland Kitchen Health Maintenance issues including appropriate healthy diet, exercise, and smoking avoidance were discussed with patient. . See orders for this visit as documented in the electronic medical record. . Patient received an After Visit Summary.  CMA served as Neurosurgeon during this visit. History, Physical, and Plan performed by medical provider. The above documentation has been reviewed and is accurate and complete. Helane Rima, D.O.  Helane Rima, DO [place] [place] 02/22/2017  Future Appointments Date Time Provider Department Center  05/09/2017 11:00 AM Helane Rima, DO [facility] None

## 2017-02-20 NOTE — Patient Instructions (Signed)
Results for orders placed or performed in visit on 02/05/17  CBC with Differential/Platelet  Result Value Ref Range   WBC 6.0 4.0 - 10.5 K/uL   RBC 4.33 4.22 - 5.81 Mil/uL   Hemoglobin 13.9 13.0 - 17.0 g/dL   HCT 16.141.4 09.639.0 - 04.552.0 %   MCV 95.6 78.0 - 100.0 fl   MCHC 33.6 30.0 - 36.0 g/dL   RDW 40.914.1 81.111.5 - 91.415.5 %   Platelets 198.0 150.0 - 400.0 K/uL   Neutrophils Relative % 53.7 43.0 - 77.0 %   Lymphocytes Relative 33.9 12.0 - 46.0 %   Monocytes Relative 6.6 3.0 - 12.0 %   Eosinophils Relative 5.2 (H) 0.0 - 5.0 %   Basophils Relative 0.6 0.0 - 3.0 %   Neutro Abs 3.2 1.4 - 7.7 K/uL   Lymphs Abs 2.0 0.7 - 4.0 K/uL   Monocytes Absolute 0.4 0.1 - 1.0 K/uL   Eosinophils Absolute 0.3 0.0 - 0.7 K/uL   Basophils Absolute 0.0 0.0 - 0.1 K/uL  Comprehensive metabolic panel  Result Value Ref Range   Sodium 135 135 - 145 mEq/L   Potassium 3.8 3.5 - 5.1 mEq/L   Chloride 102 96 - 112 mEq/L   CO2 28 19 - 32 mEq/L   Glucose, Bld 161 (H) 70 - 99 mg/dL   BUN 25 (H) 6 - 23 mg/dL   Creatinine, Ser 7.820.87 0.40 - 1.50 mg/dL   Total Bilirubin 0.6 0.2 - 1.2 mg/dL   Alkaline Phosphatase 47 39 - 117 U/L   AST 18 0 - 37 U/L   ALT 14 0 - 53 U/L   Total Protein 6.9 6.0 - 8.3 g/dL   Albumin 3.9 3.5 - 5.2 g/dL   Calcium 9.6 8.4 - 95.610.5 mg/dL   GFR 21.3089.49 >86.57>60.00 mL/min

## 2017-02-23 DIAGNOSIS — E119 Type 2 diabetes mellitus without complications: Secondary | ICD-10-CM | POA: Insufficient documentation

## 2017-03-02 ENCOUNTER — Encounter: Payer: Self-pay | Admitting: Family Medicine

## 2017-03-02 DIAGNOSIS — I5032 Chronic diastolic (congestive) heart failure: Secondary | ICD-10-CM | POA: Insufficient documentation

## 2017-03-02 DIAGNOSIS — I5189 Other ill-defined heart diseases: Secondary | ICD-10-CM | POA: Insufficient documentation

## 2017-03-04 ENCOUNTER — Telehealth: Payer: Self-pay | Admitting: Family Medicine

## 2017-03-04 NOTE — Telephone Encounter (Signed)
Spoke with patient son and they do need a new RX for the Valsartan-HCTZ combo sent to pharmacy. Son is going to check his fathers RX's and send them through My Chart. Son did say that the [name] is the most expensive RX that his father takes and he is usually in the donut whole by the end of the year. He has to pay out of pocket.

## 2017-03-04 NOTE — Telephone Encounter (Signed)
Patients son calling in to find out if there are any updates about medication change for Valsartan. He states that Dr., Earlene PlaterWallace was contacting a specialist about changing medication. Please call.

## 2017-03-05 MED ORDER — LOSARTAN POTASSIUM-HCTZ 100-12.5 MG PO TABS: 1.0000 | ORAL_TABLET | Freq: Every day | ORAL | 3 refills | Status: DC

## 2017-03-05 MED ORDER — DULAGLUTIDE 1.5 MG/0.5ML ~~LOC~~ SOAJ: SUBCUTANEOUS | 3 refills | Status: DC

## 2017-03-05 NOTE — Telephone Encounter (Signed)
Spoke with patient son and scheduled an appointment with him to come in with patient to discuss medication. They are going to hold off on starting trulicity until appointment.

## 2017-03-05 NOTE — Addendum Note (Signed)
Addended by: Dorian PodWHEELEY, Tyus Kallam J on: 03/05/2017 10:16 AM   Modules accepted: Orders

## 2017-03-05 NOTE — Telephone Encounter (Signed)
Visit the patient and his son that I still have not heard from the cardiologist regarding a medication change. Did go ahead and change his blood pressure medicine from valsartan to losartan. I would like the green light from cardiology to change his statin first since I am unsure of which statin he had a reaction to prior to coming here. My goal for his diabetes care is to put him on Victoza OR Trulicity and continue metformin. The metformin by itself should be about $9 every 3 months. I not sure how much the Victoza OR Trulicity will cost. I will go ahead and send in a prescription for Trulicity My recommendation is to go ahead and stop the Actos once he starts the Trulicity. He may notice increased blood sugars for 1-2 weeks. This is expected. Please let me know if the medication is too expensive for him to obtain.

## 2017-03-11 ENCOUNTER — Encounter: Payer: Self-pay | Admitting: Family Medicine

## 2017-03-11 ENCOUNTER — Ambulatory Visit (INDEPENDENT_AMBULATORY_CARE_PROVIDER_SITE_OTHER): Payer: Medicare Other | Admitting: Family Medicine

## 2017-03-11 VITALS — BP 174/102 | HR 81 | Temp 98.3°F | Ht 64.0 in | Wt 151.8 lb

## 2017-03-11 DIAGNOSIS — Z87891 Personal history of nicotine dependence: Secondary | ICD-10-CM

## 2017-03-11 DIAGNOSIS — I10 Essential (primary) hypertension: Secondary | ICD-10-CM | POA: Diagnosis not present

## 2017-03-11 DIAGNOSIS — E119 Type 2 diabetes mellitus without complications: Secondary | ICD-10-CM | POA: Diagnosis not present

## 2017-03-11 DIAGNOSIS — E782 Mixed hyperlipidemia: Secondary | ICD-10-CM | POA: Diagnosis not present

## 2017-03-11 LAB — BASIC METABOLIC PANEL
BUN: 21 mg/dL (ref 6–23)
CO2: 28 mEq/L (ref 19–32)
Calcium: 9.2 mg/dL (ref 8.4–10.5)
Chloride: 103 mEq/L (ref 96–112)
Creatinine, Ser: 0.88 mg/dL (ref 0.40–1.50)
GFR: 88.29 mL/min (ref >60.00)
Glucose, Bld: 131 mg/dL — ABNORMAL HIGH (ref 70–99)
Potassium: 3.9 mEq/L (ref 3.5–5.1)
Sodium: 136 mEq/L (ref 135–145)

## 2017-03-11 LAB — BRAIN NATRIURETIC PEPTIDE: Pro B Natriuretic peptide (BNP): 51 pg/mL (ref 0.0–100.0)

## 2017-03-11 LAB — CK: Total CK: 117 U/L (ref 7–232)

## 2017-03-11 MED ORDER — DULAGLUTIDE 1.5 MG/0.5ML ~~LOC~~ SOAJ: 1.5000 mg | SUBCUTANEOUS | 3 refills | Status: DC

## 2017-03-11 NOTE — Patient Instructions (Signed)
Stop Amaryl during week one. Stop Actos during week two.  Take Livalo 2-3 times per week. We will recheck your cholesterol in 3 months.  Call in one week with blood pressure readings.  Follow a heart healthy diet.  We will change your Janumet to Metformin after one month.  We may need to add another blood pressure medication.  I will be in contact with you about a low dose CT scan.

## 2017-03-11 NOTE — Progress Notes (Signed)
Aaron Blair is a 83 y.o. male is here for follow up.  History of Present Illness:   Britt Bottom CMA acting as scribe for Dr. Earlene Plater.  HPI: See Assessment and Plan section for Problem Based Charting of issues discussed today.  Health Maintenance Due  Topic Date Due  . OPHTHALMOLOGY EXAM  12/23/1943  . TETANUS/TDAP  12/24/1954  . PNA vac Low Risk Adult (2 of 2 - PCV13) 03/03/2010  . INFLUENZA VACCINE  03/12/2017   PMHx, SurgHx, SocialHx, FamHx, Medications, and Allergies were reviewed in the Visit Navigator and updated as appropriate.   Patient Active Problem List   Diagnosis Date Noted  . Diastolic dysfunction 03/02/2017  . Diabetes mellitus (HCC) 02/23/2017  . Essential hypertension 12/12/2016  . Mixed hyperlipidemia 12/12/2016  . Type 2 diabetes mellitus without complication, without long-term current use of insulin (HCC) 12/12/2016  . Left carotid stenosis 07/17/2016   Social History  Substance Use Topics  . Smoking status: Former Smoker    Years: 60.00    Quit date: 06/28/2011  . Smokeless tobacco: Never Used     Comment: Quit 3 years ago.  . Alcohol use No   Current Medications and Allergies:   .  aspirin 81 MG tablet, Take 81 mg by mouth daily., Disp: , Rfl:  .  glimepiride (AMARYL) 2 MG tablet, Take 2 mg by mouth daily with breakfast., Disp: , Rfl:  .  pioglitazone (ACTOS) 30 MG tablet, Take 30 mg by mouth daily., Disp: , Rfl:  .  Pitavastatin Calcium (LIVALO) 4 MG TABS, Take 1 tablet by mouth daily., Disp: , Rfl:  .  SitaGLIPtin-MetFORMIN HCl (JANUMET XR) [phone] MG TB24, Take 1 tablet by mouth daily. , Disp: , Rfl:  .  traMADol (ULTRAM) 50 MG tablet, Take 1 tablet (50 mg total) by mouth every 8 (eight) hours as needed., Disp: 30 tablet, Rfl: 0 .  lisinopril-hydrochlorothiazide (PRINZIDE,ZESTORETIC) 20-25 MG tablet, Take 1 tablet by mouth daily., Disp: 30 tablet, Rfl: 1  Allergies  Allergen Reactions  . No Known Allergies    Review of Systems    Pertinent items are noted in the HPI. Otherwise, ROS is negative.  Vitals:   Vitals:   03/11/17 0754  BP: (!) 174/102  Pulse: 81  Temp: 98.3 F (36.8 C)  TempSrc: Oral  SpO2: 93%  Weight: 151 lb 12.8 oz (68.9 kg)  Height: 5\' 4"  (1.626 m)     Body mass index is 26.06 kg/m.  Physical Exam:   Physical Exam  Constitutional: He is oriented to person, place, and time. He appears well-developed and well-nourished. No distress.  HENT:  Head: Normocephalic and atraumatic.  Right Ear: External ear normal.  Left Ear: External ear normal.  Nose: Nose normal.  Mouth/Throat: Oropharynx is clear and moist.  Eyes: Pupils are equal, round, and reactive to light. Conjunctivae and EOM are normal.  Neck: Normal range of motion. Neck supple.  Cardiovascular: Normal rate, regular rhythm, normal heart sounds and intact distal pulses.   Pulmonary/Chest: Effort normal and breath sounds normal.  Abdominal: Soft. Bowel sounds are normal.  Musculoskeletal: Normal range of motion.  Neurological: He is alert and oriented to person, place, and time.  Skin: Skin is warm and dry.  Psychiatric: He has a normal mood and affect. His behavior is normal. Judgment and thought content normal.  Nursing note and vitals reviewed.   Results for orders placed or performed in visit on 03/11/17  CK  Result Value Ref Range  Total CK 117 7 - 232 U/L  Basic metabolic panel  Result Value Ref Range   Sodium 136 135 - 145 mEq/L   Potassium 3.9 3.5 - 5.1 mEq/L   Chloride 103 96 - 112 mEq/L   CO2 28 19 - 32 mEq/L   Glucose, Bld 131 (H) 70 - 99 mg/dL   BUN 21 6 - 23 mg/dL   Creatinine, Ser 1.610.88 0.40 - 1.50 mg/dL   Calcium 9.2 8.4 - 09.610.5 mg/dL   GFR 04.5488.29 >09.81>60.00 mL/min  Brain natriuretic peptide  Result Value Ref Range   Pro B Natriuretic peptide (BNP) 51.0 0.0 - 100.0 pg/mL   Assessment and Plan:   Patient comes in today to discuss medications for his blood pressure medication. We switched him from the  Valsartan to Losartan due to the recall. Started the new blood pressure medication today. Will take the blood pressure at home for the next week and will call the office with the numbers.   Low oxygen levels: Patients son has been taking the patients oxygen levels at home and they have been running in the high 80's and the low 90's. He was a smoker for 30 years and stopped 7-8 years ago.   Cholesterol medication: When he first started the statin he started having body aches. He is taking Livalo and having pain in the legs. We are going to get a CK today. Start taking cholesterol medication two to three times a week. With the extended the medication then this could help with the leg pain. Will give the patient a heart healthy diet today.   Diabetes: Wanting to change his diabetes medication from Actos to Trulicity. Son was concerned because of blood sugar dropping to much with taking this. Dr. Earlene PlaterWallace explained that this is a non insulin medication. With starting this the plan is to eventually stop so of the other diabetes medications. Went over the side effects of the medication with the patient and his family.   Aleksi was seen today for medication management.  Diagnoses and all orders for this visit:  Essential hypertension Comments: Plan is to maximize Lisinopril and HCTZ, then add CCB.  Orders: -     CK -     Basic metabolic panel -     Brain natriuretic peptide  Type 2 diabetes mellitus without complication, without long-term current use of insulin (HCC) Comments: Trulicity reviewed. See AVS for weaning of oral medications. Orders: -     Dulaglutide (TRULICITY) 1.5 MG/0.5ML SOPN; Inject 1.5 mg into the skin once a week.  Mixed hyperlipidemia Comments: CK WNL. Okay to take medication qod if it causes myalgias.  Former smoker Comments: Patient qualifies for low dose CT. Will refer.  Orders: -     Ambulatory Referral for Lung Cancer Scre   . Reviewed expectations re: course of  current medical issues. . Discussed self-management of symptoms. . Outlined signs and symptoms indicating need for more acute intervention. . Patient verbalized understanding and all questions were answered. Marland Kitchen. Health Maintenance issues including appropriate healthy diet, exercise, and smoking avoidance were discussed with patient. . See orders for this visit as documented in the electronic medical record. . Patient received an After Visit Summary.  CMA served as Neurosurgeonscribe during this visit. History, Physical, and Plan performed by medical provider. The above documentation has been reviewed and is accurate and complete. Helane RimaErica Bernardo Brayman, D.O.  Helane RimaErica Mohmmad Saleeby, DO [place] 03/23/2017  Future Appointments Date Time Provider Department Center  05/09/2017  11:00 AM Helane RimaWallace, Sunny Gains, DO [facility] None

## 2017-03-13 ENCOUNTER — Telehealth: Payer: Self-pay | Admitting: Family Medicine

## 2017-03-13 NOTE — Telephone Encounter (Signed)
Please advise 

## 2017-03-13 NOTE — Telephone Encounter (Signed)
Son Emelda FearJae calling to report dad is in pain/has body aches after adjusting his bp meds. Please call back to advise, he does not want an appt. Just seen Tuesday 07/31.  Wants to know if new bp med also contains cholesterol medicine?  Thank you,  -LL

## 2017-03-14 ENCOUNTER — Other Ambulatory Visit: Payer: Self-pay

## 2017-03-14 MED ORDER — LISINOPRIL-HYDROCHLOROTHIAZIDE 20-25 MG PO TABS: 1.0000 | ORAL_TABLET | Freq: Every day | ORAL | 1 refills | Status: DC

## 2017-03-14 NOTE — Telephone Encounter (Signed)
No statin in Losartan. What are blood pressures? Other symptoms?

## 2017-03-14 NOTE — Telephone Encounter (Signed)
Forwarding to Dr. Wallace to advise.  

## 2017-03-14 NOTE — Telephone Encounter (Signed)
Spoke with Rachael FeeWoo Jae and was advised that his dad has been having body aches for awhile now but they have gotten worse since the day he started his new BP med. He says he has no joint pain, fever, chills, n/v/d. He doesn't know if he has been having any HA or blurred vision or how his BP has been running but he will check with his dad and call back.

## 2017-03-14 NOTE — Telephone Encounter (Signed)
Called Chrissie NoaWoo Jea and advised. Rx sent to [facility]. Med list updated. He will call back to schedule follow-up appointment next week.

## 2017-03-14 NOTE — Telephone Encounter (Signed)
Stop Losartan. Go back to Valsartan - reassure that it is safe to do. Hold statin until Monday. Continue to check BP. Call for check-in on Monday or come in for evaluation. Okay to see Cardiology as well. Red flags to ER.

## 2017-03-14 NOTE — Telephone Encounter (Signed)
Spoke with Rachael FeeWoo Jae and he advised that his dad didn't want to take the Valsartan either because it didn't control his BP and he heard that it causes cancer. They want to know if there is any other option.

## 2017-03-14 NOTE — Telephone Encounter (Signed)
Change to Lisinopril 20/HCTZ 25 - one po q day. Make sure to monitor BP and BG. I want to see him next week.

## 2017-03-14 NOTE — Telephone Encounter (Signed)
Son Eduard RouxJae Puopolo called to advise that the BP medication is not working for the patient and his BP is 175/?Marland Kitchen. Also, the body ache is still there and tylenol is helping some what. Call [phone] to advise as soon as possible.

## 2017-03-18 MED ORDER — LISINOPRIL 20 MG PO TABS: 20.0000 mg | ORAL_TABLET | Freq: Every day | ORAL | 3 refills | Status: DC

## 2017-03-18 NOTE — Telephone Encounter (Signed)
I am assuming that the son meant his blood pressure is 170/102. Do you want to make any changes?

## 2017-03-18 NOTE — Telephone Encounter (Signed)
Patient's son Aaron Blair called to advise that the patient's blood sugar is still 170/102. Call to advise. Okay to leave a detailed message on Jae's phone [phone].

## 2017-03-18 NOTE — Telephone Encounter (Signed)
Lisinopril 20 mg sent to pharmacy to maximize dose. So, to clarify, patient will be on 40 mg of Lisinopril and 25 mg of HCTZ. Call with BP and HR by the end of next week.

## 2017-03-18 NOTE — Telephone Encounter (Signed)
LM for patient to return call.

## 2017-03-19 NOTE — Telephone Encounter (Signed)
Notified patients son of message. He verbalized understanding.

## 2017-03-20 ENCOUNTER — Other Ambulatory Visit: Payer: Self-pay

## 2017-03-20 MED ORDER — LISINOPRIL 20 MG PO TABS: 20.0000 mg | ORAL_TABLET | Freq: Every day | ORAL | 3 refills | Status: DC

## 2017-03-20 MED ORDER — LISINOPRIL-HYDROCHLOROTHIAZIDE 20-25 MG PO TABS: 1.0000 | ORAL_TABLET | Freq: Every day | ORAL | 1 refills | Status: DC

## 2017-03-20 NOTE — Telephone Encounter (Signed)
Refills have been sent to [facility].

## 2017-03-20 NOTE — Telephone Encounter (Signed)
MEDICATION:   lisinopril (PRINIVIL,ZESTRIL) 20 MG tablet    lisinopril-hydrochlorothiazide (PRINZIDE,ZESTORETIC) 20-25 MG tablet      PHARMACY:  [facility] [address] - [place], [place] - [id number] [phone] (Phone) [phone] (Fax)   IS THIS A 90 DAY SUPPLY : patient was unsure  IS PATIENT OUT OF MEDICTAION: yes  IF NOT; HOW MUCH IS LEFT: n/a  LAST APPOINTMENT DATE:03/11/17  NEXT APPOINTMENT DATE:05/09/17  OTHER COMMENTS:  Medication was originally sent to the incorrect pharmacy, please send to pharmacy above. Patient's son Emelda FearJae is requesting a call back from clinical staff once the rx has been sent, okay to leave a detailed message.  **Let patient know to contact pharmacy at the end of the day to make sure medication is ready. **  ** Please notify patient to allow 48-72 hours to process**  **Encourage patient to contact the pharmacy for refills or they can [facility] through [facility]**

## 2017-03-23 DIAGNOSIS — Z87891 Personal history of nicotine dependence: Secondary | ICD-10-CM | POA: Insufficient documentation

## 2017-03-28 NOTE — Telephone Encounter (Signed)
Needs to be seen. Here or Cardiology doesn't matter.

## 2017-03-28 NOTE — Telephone Encounter (Signed)
LM for patients son to call the office. They will need to schedule appointment to be seen here or cardiology.

## 2017-03-28 NOTE — Telephone Encounter (Signed)
Do we need to have the patient see his cardiologist or change medication again?

## 2017-03-28 NOTE — Telephone Encounter (Signed)
Patient's son Emelda Fear called to advise that neither of the medications for the patient's BP is working and the patient's blood pressure has been 170/101 all week and he now has headaches. Call to advise as soon as possible, okay to leave a detailed message on Jae's phone.

## 2017-03-31 ENCOUNTER — Telehealth: Payer: Self-pay | Admitting: Family Medicine

## 2017-03-31 ENCOUNTER — Ambulatory Visit (INDEPENDENT_AMBULATORY_CARE_PROVIDER_SITE_OTHER): Payer: Medicare Other | Admitting: Family Medicine

## 2017-03-31 ENCOUNTER — Encounter: Payer: Self-pay | Admitting: Family Medicine

## 2017-03-31 VITALS — BP 192/114 | HR 83 | Temp 97.8°F | Ht 64.0 in | Wt 150.4 lb

## 2017-03-31 DIAGNOSIS — I10 Essential (primary) hypertension: Secondary | ICD-10-CM | POA: Diagnosis not present

## 2017-03-31 LAB — COMPREHENSIVE METABOLIC PANEL
ALT: 13 U/L (ref 0–53)
AST: 17 U/L (ref 0–37)
Albumin: 3.8 g/dL (ref 3.5–5.2)
Alkaline Phosphatase: 50 U/L (ref 39–117)
BUN: 22 mg/dL (ref 6–23)
CO2: 26 mEq/L (ref 19–32)
Calcium: 9.1 mg/dL (ref 8.4–10.5)
Chloride: 101 mEq/L (ref 96–112)
Creatinine, Ser: 0.82 mg/dL (ref 0.40–1.50)
GFR: 95.78 mL/min (ref >60.00)
Glucose, Bld: 158 mg/dL — ABNORMAL HIGH (ref 70–99)
Potassium: 3.8 mEq/L (ref 3.5–5.1)
Sodium: 135 mEq/L (ref 135–145)
Total Bilirubin: 0.8 mg/dL (ref 0.2–1.2)
Total Protein: 7.4 g/dL (ref 6.0–8.3)

## 2017-03-31 MED ORDER — AMLODIPINE BESYLATE 10 MG PO TABS: 10.0000 mg | ORAL_TABLET | Freq: Every day | ORAL | 0 refills | Status: DC

## 2017-03-31 NOTE — Telephone Encounter (Signed)
RX has been resent to [facility].

## 2017-03-31 NOTE — Progress Notes (Signed)
Aaron Blair is a 83 y.o. male is here for follow up.  History of Present Illness:   Britt Bottom CMA acting as scribe for Dr. Earlene Plater.  HPI: Patient comes in today for follow up for his blood pressure. See AP.  Health Maintenance Due  Topic Date Due  . OPHTHALMOLOGY EXAM  [birth year]1945  . TETANUS/TDAP  12/24/1954  . PNA vac Low Risk Adult (2 of 2 - PCV13) 03/03/2010  . INFLUENZA VACCINE  03/12/2017   No flowsheet data found. PMHx, SurgHx, SocialHx, FamHx, Medications, and Allergies were reviewed in the Visit Navigator and updated as appropriate.   Patient Active Problem List   Diagnosis Date Noted  . Former smoker 03/23/2017  . Diastolic dysfunction 03/02/2017  . Diabetes mellitus (HCC) 02/23/2017  . Essential hypertension 12/12/2016  . Mixed hyperlipidemia 12/12/2016  . Type 2 diabetes mellitus without complication, without long-term current use of insulin (HCC) 12/12/2016  . Left carotid stenosis 07/17/2016   Social History  Substance Use Topics  . Smoking status: Former Smoker    Years: 60.00    Quit date: 06/28/2011  . Smokeless tobacco: Never Used     Comment: Quit 3 years ago.  . Alcohol use No   Current Medications and Allergies:   Current Outpatient Prescriptions:  .  aspirin 81 MG tablet, Take 81 mg by mouth daily., Disp: , Rfl:  .  Camphor-Menthol-Methyl Sal (TIGER BALM MUSCLE RUB) 10-14-13 % CREA, Apply 1 application topically daily as needed (pain/aches). , Disp: , Rfl:  .  Dulaglutide (TRULICITY) 1.5 MG/0.5ML SOPN, 0.75 mg [place] q wk x 2 weeks, increase to 1.5 mg/wk, Disp: 0.5 mL, Rfl: 3 .  Dulaglutide (TRULICITY) 1.5 MG/0.5ML SOPN, Inject 1.5 mg into the skin once a week., Disp: 12 pen, Rfl: 3 .  glimepiride (AMARYL) 2 MG tablet, Take 2 mg by mouth daily with breakfast., Disp: , Rfl:  .  lisinopril (PRINIVIL,ZESTRIL) 20 MG tablet, Take 1 tablet (20 mg total) by mouth daily., Disp: 90 tablet, Rfl: 3 .  lisinopril-hydrochlorothiazide (PRINZIDE,ZESTORETIC) 20-25  MG tablet, Take 1 tablet by mouth daily., Disp: 30 tablet, Rfl: 1 .  pioglitazone (ACTOS) 30 MG tablet, Take 30 mg by mouth daily., Disp: , Rfl:  .  Pitavastatin Calcium (LIVALO) 4 MG TABS, Take 1 tablet by mouth daily., Disp: , Rfl:  .  SitaGLIPtin-MetFORMIN HCl (JANUMET XR) [phone] MG TB24, Take 1 tablet by mouth daily. , Disp: , Rfl:  .  traMADol (ULTRAM) 50 MG tablet, Take 1 tablet (50 mg total) by mouth every 8 (eight) hours as needed., Disp: 30 tablet, Rfl: 0   Allergies  Allergen Reactions  . No Known Allergies    Review of Systems   Pertinent items are noted in the HPI. Otherwise, ROS is negative.  Vitals:   Vitals:   03/31/17 1132  BP: (!) 192/114  Pulse: 83  Temp: 97.8 F (36.6 C)  TempSrc: Oral  SpO2: 95%  Weight: 150 lb 6.4 oz (68.2 kg)  Height: 5\' 4"  (1.626 m)     Body mass index is 25.82 kg/m. Physical Exam:   Physical Exam  Constitutional: He is oriented to person, place, and time. He appears well-developed and well-nourished. No distress.  HENT:  Head: Normocephalic and atraumatic.  Right Ear: External ear normal.  Left Ear: External ear normal.  Nose: Nose normal.  Mouth/Throat: Oropharynx is clear and moist.  Eyes: Pupils are equal, round, and reactive to light. Conjunctivae and EOM are normal.  Neck: Normal  range of motion. Neck supple.  Cardiovascular: Normal rate, regular rhythm, normal heart sounds and intact distal pulses.   Pulmonary/Chest: Effort normal and breath sounds normal.  Abdominal: Soft. Bowel sounds are normal.  Musculoskeletal: Normal range of motion.  Neurological: He is alert and oriented to person, place, and time.  Skin: Skin is warm and dry.  Psychiatric: He has a normal mood and affect. His behavior is normal. Judgment and thought content normal.  Nursing note and vitals reviewed.  Results for orders placed or performed in visit on 03/31/17  Comprehensive metabolic panel  Result Value Ref Range   Sodium 135 135 - 145  mEq/L   Potassium 3.8 3.5 - 5.1 mEq/L   Chloride 101 96 - 112 mEq/L   CO2 26 19 - 32 mEq/L   Glucose, Bld 158 (H) 70 - 99 mg/dL   BUN 22 6 - 23 mg/dL   Creatinine, Ser 1.61 0.40 - 1.50 mg/dL   Total Bilirubin 0.8 0.2 - 1.2 mg/dL   Alkaline Phosphatase 50 39 - 117 U/L   AST 17 0 - 37 U/L   ALT 13 0 - 53 U/L   Total Protein 7.4 6.0 - 8.3 g/dL   Albumin 3.8 3.5 - 5.2 g/dL   Calcium 9.1 8.4 - 09.6 mg/dL   GFR 04.54 >09.81 mL/min    Assessment and Plan:   Diagnoses and all orders for this visit: He has now maximized Lisinopril and HCTZ. Will add Norvasc and titrate from 5 to 10 mg over two weeks. No red flags today. Reviewed at length. Recheck BP at home daily. Recheck in office in 1-2 weeks.   Essential hypertension -     Comprehensive metabolic panel -     amLODipine (NORVASC) 10 MG tablet; Take 1 tablet (10 mg total) by mouth daily.  . Reviewed expectations re: course of current medical issues. . Discussed self-management of symptoms. . Outlined signs and symptoms indicating need for more acute intervention. . Patient verbalized understanding and all questions were answered. Marland Kitchen Health Maintenance issues including appropriate healthy diet, exercise, and smoking avoidance were discussed with patient. . See orders for this visit as documented in the electronic medical record. . Patient received an After Visit Summary.  CMA served as Neurosurgeon during this visit. History, Physical, and Plan performed by medical provider. The above documentation has been reviewed and is accurate and complete. Helane Rima, D.O.  Helane Rima, DO , [facility] 04/04/2017  Future Appointments Date Time Provider Department Center  05/09/2017 11:00 AM Helane Rima, DO [facility] None  07/01/2017 11:30 AM Helane Rima, DO [facility] None

## 2017-03-31 NOTE — Telephone Encounter (Signed)
Patient is coming for an appointment.

## 2017-03-31 NOTE — Patient Instructions (Signed)
We are starting Amlodipine today - start with 1/2 for 1-2 weeks so that we don't drop that blood pressure too quickly. Monitor your heart rate as well as blood pressure.   Take the Pitavastatin twice a week. We will recheck your cholesterol in 3 months.  Recheck in 1 month or sooner if you are having problems.

## 2017-03-31 NOTE — Telephone Encounter (Signed)
Patient's Pharmacy is [facility] not [facility]. Please check script sent today to make sure it was sent to [facility] on [address].  Ty,  -LL

## 2017-04-17 DIAGNOSIS — Z23 Encounter for immunization: Secondary | ICD-10-CM | POA: Diagnosis not present

## 2017-04-27 ENCOUNTER — Encounter: Payer: Self-pay | Admitting: *Deleted

## 2017-04-27 DIAGNOSIS — Z23 Encounter for immunization: Secondary | ICD-10-CM

## 2017-05-07 ENCOUNTER — Other Ambulatory Visit: Payer: Self-pay

## 2017-05-07 MED ORDER — LISINOPRIL 20 MG PO TABS: 20.0000 mg | ORAL_TABLET | Freq: Every day | ORAL | 3 refills | Status: DC

## 2017-05-08 ENCOUNTER — Ambulatory Visit: Payer: Medicare Other | Admitting: Family Medicine

## 2017-05-09 ENCOUNTER — Ambulatory Visit: Payer: Medicare Other | Admitting: Family Medicine

## 2017-05-09 NOTE — Progress Notes (Signed)
PCP notes:   Health maintenance: Opthalmology exam: Overdue. Pt will schedule this.  Tdap: Pt believes he has had this but cannot recall date. Postponed but pt educated regarding coverage and needs for Tdap.   Abnormal screenings: None.   Patient concerns: Per pt, his lower legs swell at night. States he walks all day for work.   Nurse concerns: None.   Next PCP appt: 05/12/2017 10:45

## 2017-05-09 NOTE — Progress Notes (Signed)
Subjective:   Aaron Blair is a 83 y.o. male who presents for an Initial Medicare Annual Wellness Visit.  Review of Systems  No ROS.  Medicare Wellness Visit. Additional risk factors are reflected in the social history.  Cardiac Risk Factors include: advanced age (>11men, >76 women);diabetes mellitus;dyslipidemia;hypertension;male gender    Objective:    Today's Vitals   05/12/17 1116  BP: 122/84  Pulse: 79  Resp: 16  Temp: 98 F (36.7 C)  TempSrc: Oral  SpO2: 96%  Weight: 151 lb 12.8 oz (68.9 kg)  Height:  (1.626 m)   Body mass index is 26.06 kg/m.  Current Medications (verified) Outpatient Encounter Prescriptions as of 05/12/2017  Medication Sig  . amLODipine (NORVASC) 10 MG tablet Take 1 tablet (10 mg total) by mouth daily.  Marland Kitchen aspirin 81 MG tablet Take 81 mg by mouth daily.  . Camphor-Menthol-Methyl Sal (TIGER BALM MUSCLE RUB) 10-14-13 % CREA Apply 1 application topically daily as needed (pain/aches).   . Dulaglutide (TRULICITY) 1.5 MG/0.5ML SOPN 0.75 mg [place] q wk x 2 weeks, increase to 1.5 mg/wk  . Dulaglutide (TRULICITY) 1.5 MG/0.5ML SOPN Inject 1.5 mg into the skin once a week.  Marland Kitchen glimepiride (AMARYL) 2 MG tablet Take 2 mg by mouth daily with breakfast.  . lisinopril (PRINIVIL,ZESTRIL) 20 MG tablet Take 1 tablet (20 mg total) by mouth daily.  Marland Kitchen lisinopril-hydrochlorothiazide (PRINZIDE,ZESTORETIC) 20-25 MG tablet Take 1 tablet by mouth daily.  . pioglitazone (ACTOS) 30 MG tablet Take 30 mg by mouth daily.  . Pitavastatin Calcium (LIVALO) 4 MG TABS Take 1 tablet by mouth daily.  . SitaGLIPtin-MetFORMIN HCl (JANUMET XR) [phone] MG TB24 Take 1 tablet by mouth daily.   . traMADol (ULTRAM) 50 MG tablet Take 1 tablet (50 mg total) by mouth every 8 (eight) hours as needed.   No facility-administered encounter medications on file as of 05/12/2017.     Allergies (verified) No known allergies   History: Past Medical History:  Diagnosis Date  . Chronic gastritis   .  Diabetes type 2, uncontrolled (HCC)   . Hyperlipidemia   . Hypertension   . Lumbar herniated disc, L3-4   . Vertigo    Past Surgical History:  Procedure Laterality Date  . BACK SURGERY    . COLONOSCOPY    . ENDARTERECTOMY Left 07/17/2016   Procedure: ENDARTERECTOMY CAROTID;  Surgeon: Sherren Kerns, MD;  Location: [facility] OR;  Service: Vascular;  Laterality: Left;  . SPINE SURGERY  02/05/2016   History reviewed. No pertinent family history. Social History   Occupational History  . 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 Smoking status: Former Smoker    Years: 60.00    Quit date: 06/28/2011  . Smokeless tobacco: Never Used     Comment: Quit 3 years ago.  . Alcohol use No  . Drug use: No  . Sexual activity: Not on file   Tobacco Counseling Counseling given: Not Answered   Activities of Daily Living In your present state of health, do you have any difficulty performing the following activities: 05/12/2017 12/15/2016  Hearing? N N  Vision? N N  Difficulty concentrating or making decisions? N N  Walking or climbing stairs? N N  Dressing or bathing? N N  Doing errands, shopping? N N  Preparing Food and eating ? N -  Using the Toilet? N -  In the past six months, have you accidently leaked urine? N -  Do you have  problems with loss of bowel control? N -  Managing your Medications? N -  Managing your Finances? N -  Housekeeping or managing your Housekeeping? N -  Some recent data might be hidden    Immunizations and Health Maintenance Immunization History  Administered Date(s) Administered  . DT 06/09/2009  . Influenza,inj,Quad PF,6+ Mos 04/27/2017  . Influenza-Unspecified 04/30/2012, 05/27/2013, 04/21/2014, 04/11/2015  . Pneumococcal Polysaccharide-23 03/03/2009  . Zoster Recombinat (Shingrix) 04/21/2017   Health Maintenance Due  Topic Date Due  . Ma Hillock  12/23/1943    Patient Care Team: Helane Rima, DO as PCP - General  (Family Medicine)  Indicate any recent Medical Services you may have received from other than [facility] providers in the past year (date may be approximate).    Assessment:   This is a routine wellness examination for Aaron Blair. Physical assessment deferred to PCP.   Hearing/Vision screen Hearing Screening Comments: Able to hear conversational tones w/o difficulty. No issues reported.   Vision Screening Comments: Wears bifocals. Hx cataract removal. Pt has not had an eye exam in several years but will schedule one.  Dietary issues and exercise activities discussed: Current Exercise Habits: The patient has a physically strenous job, but has no regular exercise apart from work., Exercise limited by: None identified  Discussed diabetic diet and fruits. Pt states that he does understand the diabetic diet but he eats a lot of fresh fruits.  Goals    . Maintain current health status.       Depression Screen PHQ 2/9 Scores 05/12/2017  PHQ - 2 Score 0    Fall Risk Fall Risk  05/12/2017  Falls in the past year? No    Cognitive Function: MMSE - Mini Mental State Exam 05/12/2017  Not completed: Unable to complete  Ad8 score reviewed for issues:  Issues making decisions: no  Less interest in hobbies / activities:no  Repeats questions, stories (family complaining):no  Trouble using ordinary gadgets (microwave, computer, phone):no  Forgets the month or year: no  Mismanaging finances: no  Remembering appts:no  Daily problems with thinking and/or memory:no Ad8 score is=0       Screening Tests Health Maintenance  Topic Date Due  . OPHTHALMOLOGY EXAM  12/23/1943  . TETANUS/TDAP  05/12/2018 (Originally 12/24/1952)  . HEMOGLOBIN A1C  06/19/2017  . FOOT EXAM  02/20/2018  . INFLUENZA VACCINE  Completed  . PNA vac Low Risk Adult  Completed        Plan:   Follow up with PCP as directed.  I have personally reviewed and noted the following in the patient's chart:   . Medical and  social history . Use of alcohol, tobacco or illicit drugs  . Current medications and supplements . Functional ability and status . Nutritional status . Physical activity . Advanced directives . List of other physicians . Vitals . Screenings to include cognitive, depression, and falls . Referrals and appointments  In addition, I have reviewed and discussed with patient certain preventive protocols, quality metrics, and best practice recommendations. A written personalized care plan for preventive services as well as general preventive health recommendations were provided to patient.     Richelle Ito, RN   05/12/2017

## 2017-05-09 NOTE — Progress Notes (Signed)
Pre visit review using our clinic review tool, if applicable. No additional management support is needed unless otherwise documented below in the visit note. 

## 2017-05-12 ENCOUNTER — Encounter: Payer: Self-pay | Admitting: Family Medicine

## 2017-05-12 ENCOUNTER — Encounter: Payer: Self-pay | Admitting: *Deleted

## 2017-05-12 ENCOUNTER — Ambulatory Visit (INDEPENDENT_AMBULATORY_CARE_PROVIDER_SITE_OTHER): Payer: Medicare Other | Admitting: Family Medicine

## 2017-05-12 ENCOUNTER — Ambulatory Visit (INDEPENDENT_AMBULATORY_CARE_PROVIDER_SITE_OTHER): Payer: Medicare Other | Admitting: *Deleted

## 2017-05-12 VITALS — BP 122/84 | HR 79 | Temp 98.0°F | Resp 16 | Ht 64.0 in | Wt 151.0 lb

## 2017-05-12 VITALS — BP 122/84 | HR 79 | Temp 98.0°F | Resp 16 | Ht 64.0 in | Wt 151.8 lb

## 2017-05-12 DIAGNOSIS — E119 Type 2 diabetes mellitus without complications: Secondary | ICD-10-CM | POA: Diagnosis not present

## 2017-05-12 DIAGNOSIS — Z0000 Encounter for general adult medical examination without abnormal findings: Secondary | ICD-10-CM | POA: Diagnosis not present

## 2017-05-12 DIAGNOSIS — E782 Mixed hyperlipidemia: Secondary | ICD-10-CM

## 2017-05-12 DIAGNOSIS — I10 Essential (primary) hypertension: Secondary | ICD-10-CM

## 2017-05-12 LAB — COMPREHENSIVE METABOLIC PANEL
ALT: 12 U/L (ref 0–53)
AST: 17 U/L (ref 0–37)
Albumin: 4.4 g/dL (ref 3.5–5.2)
Alkaline Phosphatase: 47 U/L (ref 39–117)
BUN: 17 mg/dL (ref 6–23)
CO2: 29 mEq/L (ref 19–32)
Calcium: 10 mg/dL (ref 8.4–10.5)
Chloride: 98 mEq/L (ref 96–112)
Creatinine, Ser: 0.83 mg/dL (ref 0.40–1.50)
GFR: 94.42 mL/min (ref >60.00)
Glucose, Bld: 150 mg/dL — ABNORMAL HIGH (ref 70–99)
Potassium: 4 mEq/L (ref 3.5–5.1)
Sodium: 136 mEq/L (ref 135–145)
Total Bilirubin: 0.9 mg/dL (ref 0.2–1.2)
Total Protein: 7.3 g/dL (ref 6.0–8.3)

## 2017-05-12 LAB — POCT GLYCOSYLATED HEMOGLOBIN (HGB A1C): Hemoglobin A1C: 8

## 2017-05-12 MED ORDER — HYDROCHLOROTHIAZIDE 25 MG PO TABS: 25.0000 mg | ORAL_TABLET | Freq: Every day | ORAL | 3 refills | Status: DC

## 2017-05-12 MED ORDER — LISINOPRIL 40 MG PO TABS: 40.0000 mg | ORAL_TABLET | Freq: Every day | ORAL | 3 refills | Status: DC

## 2017-05-12 NOTE — Progress Notes (Signed)
Aaron Blair is a 83 y.o. male is here for follow up.  History of Present Illness:   HPI:  1. Type 2 diabetes mellitus without complication, without long-term current use of insulin (HCC).  Current symptoms: no polyuria or polydipsia, no chest pain, dyspnea or TIA's, no numbness, tingling or pain in extremities.  Taking medication compliantly without noted sided effects   YES    NO   Episodes of hypoglycemia?   YES    NO Maintaining a diabetic diet?   YES    NO Trying to exercise on a regular basis?   YES    NO  On ACE inhibitor or angiotensin II receptor blocker?   YES    NO On Aspirin?   YES    NO  Lab Results  Component Value Date   HGBA1C 8.0 05/12/2017   Lab Results  Component Value Date   CHOL 148 12/17/2016   HDL 30 (L) 12/17/2016   LDLCALC 96 12/17/2016   TRIG 110 12/17/2016   CHOLHDL 4.9 12/17/2016     Wt Readings from Last 3 Encounters:  05/12/17 151 lb 12.8 oz (68.9 kg)  05/12/17 151 lb (68.5 kg)  03/31/17 150 lb 6.4 oz (68.2 kg)   BP Readings from Last 3 Encounters:  05/12/17 122/84  05/12/17 122/84  03/31/17 (!) 192/114   Lab Results  Component Value Date   CREATININE 0.82 03/31/2017    2. Mixed hyperlipidemia. Labs as above.   3. Essential hypertension.   Avoiding excessive salt intake?   YES    NO Trying to exercise on a regular basis?   YES    NO Review: taking medications as instructed, no medication side effects noted, no TIAs, no chest pain on exertion, no dyspnea on exertion, noting swelling of ankles.   Wt Readings from Last 3 Encounters:  05/12/17 151 lb 12.8 oz (68.9 kg)  05/12/17 151 lb (68.5 kg)  03/31/17 150 lb 6.4 oz (68.2 kg)   Reports that he quit smoking about 5 years ago. He quit after 60.00 years of use. He has never used smokeless tobacco.  BP Readings from Last 3 Encounters:  05/12/17 122/84  05/12/17 122/84  03/31/17 (!) 192/114   Lab Results  Component Value Date   CREATININE 0.82 03/31/2017      Health Maintenance Due  Topic Date Due  . OPHTHALMOLOGY EXAM  12/23/1943   Depression screen PHQ 2/9 05/12/2017  Decreased Interest 0  Down, Depressed, Hopeless 0  PHQ - 2 Score 0   PMHx, SurgHx, SocialHx, FamHx, Medications, and Allergies were reviewed in the Visit Navigator and updated as appropriate.   Patient Active Problem List   Diagnosis Date Noted  . Former smoker 03/23/2017  . Diastolic dysfunction 03/02/2017  . Diabetes mellitus (HCC) 02/23/2017  . Essential hypertension 12/12/2016  . Mixed hyperlipidemia 12/12/2016  . Type 2 diabetes mellitus without complication, without long-term current use of insulin (HCC) 12/12/2016  . Left carotid stenosis 07/17/2016   Social History  Substance Use Topics  . Smoking status: Former Smoker    Years: 60.00    Quit date: 06/28/2011  . Smokeless tobacco: Never Used     Comment: Quit 3 years ago.  . Alcohol use No   Current Medications and Allergies:   .  amLODipine (NORVASC) 10 MG tablet, Take 1 tablet (10 mg total) by mouth daily., Disp: 90 tablet, Rfl: 0 .  aspirin 81 MG tablet, Take 81 mg by mouth daily., Disp: , Rfl:  .  glimepiride (AMARYL) 2 MG tablet, Take 2 mg by mouth daily with breakfast., Disp: , Rfl:  .  lisinopril (PRINIVIL,ZESTRIL) 20 MG tablet, Take 1 tablet (20 mg total) by mouth daily., Disp: 90 tablet, Rfl: 3 .  lisinopril-hydrochlorothiazide (PRINZIDE,ZESTORETIC) 20-25 MG tablet, Take 1 tablet by mouth daily., Disp: 30 tablet, Rfl: 1 .  pioglitazone (ACTOS) 30 MG tablet, Take 30 mg by mouth daily., Disp: , Rfl:  .  Pitavastatin Calcium (LIVALO) 4 MG TABS, Take 1 tablet by mouth daily., Disp: , Rfl:  .  SitaGLIPtin-MetFORMIN HCl (JANUMET XR) [phone] MG TB24, Take 1 tablet by mouth daily. , Disp: , Rfl:  .  traMADol (ULTRAM) 50 MG tablet, Take 1 tablet (50 mg total) by mouth every 8 (eight) hours as needed., Disp: 30 tablet, Rfl: 0  Allergies  Allergen Reactions  . No Known  Allergies    Review of Systems   Pertinent items are noted in the HPI. Otherwise, ROS is negative.  Vitals:   Vitals:   05/12/17 1125  BP: 122/84  Pulse: 79  Resp: 16  Temp: 98 F (36.7 C)  TempSrc: Oral  SpO2: 96%  Weight: 151 lb (68.5 kg)  Height:  (1.626 m)     Body mass index is 25.92 kg/m.   Physical Exam:   Physical Exam  Constitutional: He is oriented to person, place, and time. He appears well-developed and well-nourished. No distress.  HENT:  Head: Normocephalic and atraumatic.  Right Ear: External ear normal.  Left Ear: External ear normal.  Nose: Nose normal.  Mouth/Throat: Oropharynx is clear and moist.  Eyes: Pupils are equal, round, and reactive to light. Conjunctivae and EOM are normal.  Neck: Normal range of motion. Neck supple.  Cardiovascular: Normal rate, regular rhythm, normal heart sounds and intact distal pulses.   Pulmonary/Chest: Effort normal and breath sounds normal.  Abdominal: Soft. Bowel sounds are normal.  Musculoskeletal: Normal range of motion.  Neurological: He is alert and oriented to person, place, and time.  Skin: Skin is warm and dry.  Psychiatric: He has a normal mood and affect. His behavior is normal. Judgment and thought content normal.  Nursing note and vitals reviewed.   Lab Results  Component Value Date   HGBA1C 8.0 05/12/2017   Assessment and Plan:   Diagnoses and all orders for this visit:  Type 2 diabetes mellitus without complication, without long-term current use of insulin (HCC) Comments: Continue current treatment.  Orders: -     POCT glycosylated hemoglobin (Hb A1C) -     Comprehensive metabolic panel  Mixed hyperlipidemia Comments: Continue current treatment.  Orders: -     Comprehensive metabolic panel  Essential hypertension Comments: Continue current treatment. Recheck labs today. Orders: -     lisinopril (PRINIVIL,ZESTRIL) 40 MG tablet; Take 1 tablet (40 mg total) by mouth daily. -      hydrochlorothiazide (HYDRODIURIL) 25 MG tablet; Take 1 tablet (25 mg total) by mouth daily. -     Comprehensive metabolic panel    . Reviewed expectations re: course of current medical issues. . Discussed self-management of symptoms. . Outlined signs and symptoms indicating need for more acute intervention. . Patient verbalized understanding and all questions were answered. Marland Kitchen Health Maintenance issues including appropriate healthy diet, exercise, and smoking avoidance were discussed with patient. . See orders for this visit as documented in the electronic medical record. . Patient received an After Visit Summary.  Helane Rima, DO [place] [facility] 05/12/2017  Future Appointments  Date Time Provider Department Center  07/01/2017 11:30 AM Helane Rima, DO [facility] None  05/13/2018 11:00 AM Neomia Dear, RN [facility] None  05/13/2018 11:30 AM Helane Rima, DO [facility] None

## 2017-05-12 NOTE — Progress Notes (Signed)
I have personally reviewed the Medicare Annual Wellness questionnaire and have noted 1. The patient's medical and social history 2. Their use of alcohol, tobacco or illicit drugs 3. Their current medications and supplements 4. The patient's functional ability including ADL's, fall risks, home safety risks and hearing or visual impairment. 5. Diet and physical activities 6. Evidence for depression or mood disorders 7. Reviewed Updated provider list, see scanned forms and [facility] Snapshot.   The patients weight, height, BMI and visual acuity have been recorded in the chart I have made referrals, counseling and provided education to the patient based review of the above and I have provided the pt with a written personalized care plan for preventive services.  I have provided the patient with a copy of your personalized plan for preventive services. Instructed to take the time to review along with their updated medication list.   Rabecka Brendel, D.O. Family Medicine [facility]  

## 2017-05-12 NOTE — Patient Instructions (Addendum)
Aaron Blair , Thank you for taking time to come for your Medicare Wellness Visit. I appreciate your ongoing commitment to your health goals. Please review the following plan we discussed and let me know if I can assist you in the future.   These are the goals we discussed: Goals    . Maintain current health status.        This is a list of the screening recommended for you and due dates:  Health Maintenance  Topic Date Due  . Eye exam for diabetics  12/23/1943  . Tetanus Vaccine  05/12/2018*  . Hemoglobin A1C  06/19/2017  . Complete foot exam   02/20/2018  . Flu Shot  Completed  . Pneumonia vaccines  Completed  *Topic was postponed. The date shown is not the original due date.   Preventive Care for Adults  A healthy lifestyle and preventive care can promote health and wellness. Preventive health guidelines for adults include the following key practices.  . A routine yearly physical is a good way to check with your health care provider about your health and preventive screening. It is a chance to share any concerns and updates on your health and to receive a thorough exam.  . Visit your dentist for a routine exam and preventive care every 6 months. Brush your teeth twice a day and floss once a day. Good oral hygiene prevents tooth decay and gum disease.  . The frequency of eye exams is based on your age, health, family medical history, use  of contact lenses, and other factors. Follow your health care provider's ecommendations for frequency of eye exams.  . Eat a healthy diet. Foods like vegetables, fruits, whole grains, low-fat dairy products, and lean protein foods contain the nutrients you need without too many calories. Decrease your intake of foods high in solid fats, added sugars, and salt. Eat the right amount of calories for you. Get information about a proper diet from your health care provider, if necessary.  . Regular physical exercise is one of the most important things you  can do for your health. Most adults should get at least 150 minutes of moderate-intensity exercise (any activity that increases your heart rate and causes you to sweat) each week. In addition, most adults need muscle-strengthening exercises on 2 or more days a week.  Silver Sneakers may be a benefit available to you. To determine eligibility, you may visit the website: www.silversneakers.com or contact program at [phone] Mon-Fri between 8AM-8PM.   . Maintain a healthy weight. The body mass index (BMI) is a screening tool to identify possible weight problems. It provides an estimate of body fat based on height and weight. Your health care provider can find your BMI and can help you achieve or maintain a healthy weight.   For adults 20 years and older: ? A BMI below 18.5 is considered underweight. ? A BMI of 18.5 to 24.9 is normal. ? A BMI of 25 to 29.9 is considered overweight. ? A BMI of 30 and above is considered obese.   . Maintain normal blood lipids and cholesterol levels by exercising and minimizing your intake of saturated fat. Eat a balanced diet with plenty of fruit and vegetables. Blood tests for lipids and cholesterol should begin at age 81 and be repeated every 5 years. If your lipid or cholesterol levels are high, you are over 50, or you are at high risk for heart disease, you may need your cholesterol levels checked more frequently. Ongoing  high lipid and cholesterol levels should be treated with medicines if diet and exercise are not working.  . If you smoke, find out from your health care provider how to quit. If you do not use tobacco, please do not start.  . If you choose to drink alcohol, please do not consume more than 2 drinks per day. One drink is considered to be 12 ounces (355 mL) of beer, 5 ounces (148 mL) of wine, or 1.5 ounces (44 mL) of liquor.  . If you are 44-2 years old, ask your health care provider if you should take aspirin to prevent strokes.  . Use  sunscreen. Apply sunscreen liberally and repeatedly throughout the day. You should seek shade when your shadow is shorter than you. Protect yourself by wearing long sleeves, pants, a wide-brimmed hat, and sunglasses year round, whenever you are outdoors.  . Once a month, do a whole body skin exam, using a mirror to look at the skin on your back. Tell your health care provider of new moles, moles that have irregular borders, moles that are larger than a pencil eraser, or moles that have changed in shape or color.

## 2017-05-12 NOTE — Patient Instructions (Addendum)
Consider trying Turmeric for you arthritis.

## 2017-05-23 ENCOUNTER — Ambulatory Visit (INDEPENDENT_AMBULATORY_CARE_PROVIDER_SITE_OTHER): Payer: Medicare Other | Admitting: Family Medicine

## 2017-05-23 VITALS — BP 170/100 | HR 78 | Wt 151.0 lb

## 2017-05-23 DIAGNOSIS — I10 Essential (primary) hypertension: Secondary | ICD-10-CM | POA: Diagnosis not present

## 2017-05-25 ENCOUNTER — Encounter: Payer: Self-pay | Admitting: Family Medicine

## 2017-05-25 NOTE — Progress Notes (Signed)
Aaron Blair is a 83 y.o. male here for an acute visit.  History of Present Illness:   HPI: Patient is in with his wife today. His wife is being seen for another reason and the interpreter is accompanying them both. The interpreter notes that his blood pressure may be controlled today. She would like to make sure that he is taking the correct medications at the correct time. His blood pressure was checked today and is elevated. He denies any chest pain, shortness of breath, headaches, or dizziness. He denies any increased lower extremity edema. A copy of his medication list was printed and given to the interpreter to go over with him in strict detail. Red flags were reviewed.  PMHx, SurgHx, SocialHx, Medications, and Allergies were reviewed in the Visit Navigator and updated as appropriate.  Current Medications:   .  amLODipine (NORVASC) 10 MG tablet, Take 1 tablet (10 mg total) by mouth daily., Disp: 90 tablet, Rfl: 0 .  aspirin 81 MG tablet, Take 81 mg by mouth daily., Disp: , Rfl:  .  Camphor-Menthol-Methyl Sal (TIGER BALM MUSCLE RUB) 10-14-13 % CREA, Apply 1 application topically daily as needed (pain/aches). , Disp: , Rfl:  .  glimepiride (AMARYL) 2 MG tablet, Take 2 mg by mouth daily with breakfast., Disp: , Rfl:  .  hydrochlorothiazide (HYDRODIURIL) 25 MG tablet, Take 1 tablet (25 mg total) by mouth daily., Disp: 90 tablet, Rfl: 3 .  lisinopril (PRINIVIL,ZESTRIL) 40 MG tablet, Take 1 tablet (40 mg total) by mouth daily., Disp: 90 tablet, Rfl: 3 .  pioglitazone (ACTOS) 30 MG tablet, Take 30 mg by mouth daily., Disp: , Rfl:  .  Pitavastatin Calcium (LIVALO) 4 MG TABS, Take 1 tablet by mouth daily., Disp: , Rfl:  .  SitaGLIPtin-MetFORMIN HCl (JANUMET XR) [phone] MG TB24, Take 1 tablet by mouth daily. , Disp: , Rfl:  .  traMADol (ULTRAM) 50 MG tablet, Take 1 tablet (50 mg total) by mouth every 8 (eight) hours as needed., Disp: 30 tablet, Rfl: 0   Allergies  Allergen Reactions  . No Known  Allergies    Review of Systems:   Pertinent items are noted in the HPI. Otherwise, ROS is negative.  Vitals:   Vitals:   05/23/17 1248  BP: (!) 170/100  Pulse: 78  SpO2: 94%  Weight: 151 lb (68.5 kg)     Body mass index is 25.92 kg/m.   Physical Exam:   Physical Exam  Constitutional: He is oriented to person, place, and time. He appears well-developed and well-nourished. No distress.  HENT:  Head: Normocephalic and atraumatic.  Eyes: Conjunctivae are normal.  Neck: Neck supple.  Pulmonary/Chest: Effort normal.  Neurological: He is alert and oriented to person, place, and time.  Nursing note and vitals reviewed.   Assessment and Plan:   Diagnoses and all orders for this visit:  Essential hypertension Comments: Not controlled today. Looked great at the last visit. Asymptomatic, other than facial redness. Interpretor to review medications.   . Reviewed expectations re: course of current medical issues. . Discussed self-management of symptoms. . Outlined signs and symptoms indicating need for more acute intervention. . Patient verbalized understanding and all questions were answered. Marland Kitchen Health Maintenance issues including appropriate healthy diet, exercise, and smoking avoidance were discussed with patient. . See orders for this visit as documented in the electronic medical record. . Patient received an After Visit Summary.    Helane Rima, DO [place] 05/25/2017  Future Appointments  Date Time Provider Department Center  07/07/2017 11:00 AM Helane Rima, DO [facility] None  05/13/2018 11:00 AM Sherrye Payor, RN [facility] None  05/13/2018 11:30 AM Helane Rima, DO [facility] None

## 2017-05-27 ENCOUNTER — Other Ambulatory Visit: Payer: Self-pay

## 2017-05-27 MED ORDER — LISINOPRIL-HYDROCHLOROTHIAZIDE 20-25 MG PO TABS: 1.0000 | ORAL_TABLET | Freq: Every day | ORAL | 1 refills | Status: DC

## 2017-07-01 ENCOUNTER — Ambulatory Visit: Payer: Medicare Other | Admitting: Family Medicine

## 2017-07-02 ENCOUNTER — Ambulatory Visit: Payer: Medicare Other | Admitting: Family Medicine

## 2017-07-07 ENCOUNTER — Ambulatory Visit (INDEPENDENT_AMBULATORY_CARE_PROVIDER_SITE_OTHER): Payer: Medicare Other | Admitting: Family Medicine

## 2017-07-07 ENCOUNTER — Encounter: Payer: Self-pay | Admitting: Family Medicine

## 2017-07-07 VITALS — BP 124/76 | HR 86 | Temp 98.3°F | Wt 154.6 lb

## 2017-07-07 DIAGNOSIS — I10 Essential (primary) hypertension: Secondary | ICD-10-CM | POA: Diagnosis not present

## 2017-07-07 DIAGNOSIS — E119 Type 2 diabetes mellitus without complications: Secondary | ICD-10-CM | POA: Diagnosis not present

## 2017-07-07 NOTE — Progress Notes (Signed)
Aaron Blair is a 83 y.o. male is here for follow up.  History of Present Illness:   HPI:   1. Essential hypertension.   Home blood pressure readings SBP = 145-148 in am and 122-128 in pm.  Avoiding excessive salt intake? [x]   YES  []   NO Trying to exercise on a regular basis? [x]   YES  []   NO Review: taking medications as instructed, no medication side effects noted, no TIAs, no chest pain on exertion, no dyspnea on exertion.   Wt Readings from Last 3 Encounters:  07/07/17 154 lb 9.6 oz (70.1 kg)  05/23/17 151 lb (68.5 kg)  05/12/17 151 lb 12.8 oz (68.9 kg)   Reports that he quit smoking about 6 years ago. He quit after 60.00 years of use. he has never used smokeless tobacco.  BP Readings from Last 3 Encounters:  07/07/17 124/76  05/23/17 (!) 170/100  05/12/17 122/84   Lab Results  Component Value Date   CREATININE 0.83 05/12/2017     2. Type 2 diabetes mellitus without complication, without long-term current use of insulin (HCC).  Current symptoms: no polyuria or polydipsia, no chest pain, dyspnea or TIA's, no numbness, tingling or pain in extremities.  Taking medication compliantly without noted sided effects [x]   YES  []   NO  Home glucose monitoring in the range of  150-170, up from 120-130.  Episodes of hypoglycemia? []   YES  [x]   NO Maintaining a diabetic diet? []   YES  [x]   NO Trying to exercise on a regular basis? [x]   YES  []   NO  On ACE inhibitor or angiotensin II receptor blocker? [x]   YES  []   NO On Aspirin? [x]   YES  []   NO  Lab Results  Component Value Date   HGBA1C 8.0 05/12/2017    No results found for: Concepcion ElkMICROALBUR, MALB24HUR  Lab Results  Component Value Date   CHOL 148 12/17/2016   HDL 30 (L) 12/17/2016   LDLCALC 96 12/17/2016   TRIG 110 12/17/2016   CHOLHDL 4.9 12/17/2016     Wt Readings from Last 3 Encounters:  07/07/17 154 lb 9.6 oz (70.1 kg)  05/23/17 151 lb (68.5 kg)  05/12/17 151 lb 12.8 oz (68.9 kg)   BP Readings from Last 3  Encounters:  07/07/17 124/76  05/23/17 (!) 170/100  05/12/17 122/84   Lab Results  Component Value Date   CREATININE 0.83 05/12/2017      Health Maintenance Due  Topic Date Due  . OPHTHALMOLOGY EXAM  12/23/1943   Depression screen PHQ 2/9 05/12/2017  Decreased Interest 0  Down, Depressed, Hopeless 0  PHQ - 2 Score 0   PMHx, SurgHx, SocialHx, FamHx, Medications, and Allergies were reviewed in the Visit Navigator and updated as appropriate.   Patient Active Problem List   Diagnosis Date Noted  . Former smoker 03/23/2017  . Diastolic dysfunction 03/02/2017  . Diabetes mellitus (HCC) 02/23/2017  . Essential hypertension 12/12/2016  . Mixed hyperlipidemia 12/12/2016  . Type 2 diabetes mellitus without complication, without long-term current use of insulin (HCC) 12/12/2016  . Left carotid stenosis 07/17/2016   Social History   Tobacco Use  . Smoking status: Former Smoker    Years: 60.00    Last attempt to quit: 06/28/2011    Years since quitting: 6.0  . Smokeless tobacco: Never Used  . Tobacco comment: Quit 3 years ago.  Substance Use Topics  . Alcohol use: No    Alcohol/week: 0.0 oz  .  Drug use: No   Current Medications and Allergies:   Current Outpatient Medications:  .  amLODipine (NORVASC) 10 MG tablet, Take 1 tablet (10 mg total) by mouth daily., Disp: 90 tablet, Rfl: 0 .  aspirin 81 MG tablet, Take 81 mg by mouth daily., Disp: , Rfl:  .  Camphor-Menthol-Methyl Sal (TIGER BALM MUSCLE RUB) 10-14-13 % CREA, Apply 1 application topically daily as needed (pain/aches). , Disp: , Rfl:  .  glimepiride (AMARYL) 2 MG tablet, Take 2 mg by mouth daily with breakfast., Disp: , Rfl:  .  hydrochlorothiazide (HYDRODIURIL) 25 MG tablet, Take 1 tablet (25 mg total) by mouth daily., Disp: 90 tablet, Rfl: 3 .  lisinopril-hydrochlorothiazide (PRINZIDE,ZESTORETIC) 20-25 MG tablet, Take 1 tablet by mouth daily., Disp: 90 tablet, Rfl: 1 .  pioglitazone (ACTOS) 30 MG tablet, Take 30 mg by  mouth daily., Disp: , Rfl:  .  Pitavastatin Calcium (LIVALO) 4 MG TABS, Take 1 tablet by mouth daily., Disp: , Rfl:  .  SitaGLIPtin-MetFORMIN HCl (JANUMET XR) [phone] MG TB24, Take 1 tablet by mouth daily. , Disp: , Rfl:    Allergies  Allergen Reactions  . No Known Allergies    Review of Systems   Pertinent items are noted in the HPI. Otherwise, ROS is negative.  Vitals:   Vitals:   07/07/17 1059  BP: 124/76  Pulse: 86  Temp: 98.3 F (36.8 C)  TempSrc: Oral  SpO2: 95%  Weight: 154 lb 9.6 oz (70.1 kg)     Body mass index is 26.54 kg/m.   Physical Exam:   Physical Exam  Constitutional: He is oriented to person, place, and time. He appears well-developed and well-nourished. No distress.  HENT:  Head: Normocephalic and atraumatic.  Right Ear: External ear normal.  Left Ear: External ear normal.  Nose: Nose normal.  Mouth/Throat: Oropharynx is clear and moist.  Eyes: Conjunctivae and EOM are normal. Pupils are equal, round, and reactive to light.  Neck: Normal range of motion. Neck supple.  Cardiovascular: Normal rate, regular rhythm, normal heart sounds and intact distal pulses.  Pulmonary/Chest: Effort normal and breath sounds normal.  Abdominal: Soft. Bowel sounds are normal.  Musculoskeletal: Normal range of motion.  Neurological: He is alert and oriented to person, place, and time.  Skin: Skin is warm and dry.  Psychiatric: He has a normal mood and affect. His behavior is normal. Judgment and thought content normal.  Nursing note and vitals reviewed.  Results for orders placed or performed in visit on 05/12/17  Comprehensive metabolic panel  Result Value Ref Range   Sodium 136 135 - 145 mEq/L   Potassium 4.0 3.5 - 5.1 mEq/L   Chloride 98 96 - 112 mEq/L   CO2 29 19 - 32 mEq/L   Glucose, Bld 150 (H) 70 - 99 mg/dL   BUN 17 6 - 23 mg/dL   Creatinine, Ser 2.95 0.40 - 1.50 mg/dL   Total Bilirubin 0.9 0.2 - 1.2 mg/dL   Alkaline Phosphatase 47 39 - 117 U/L   AST  17 0 - 37 U/L   ALT 12 0 - 53 U/L   Total Protein 7.3 6.0 - 8.3 g/dL   Albumin 4.4 3.5 - 5.2 g/dL   Calcium 62.1 8.4 - 30.8 mg/dL   GFR 65.78 >46.96 mL/min  POCT glycosylated hemoglobin (Hb A1C)  Result Value Ref Range   Hemoglobin A1C 8.0    Assessment and Plan:   Kveon was seen today for follow-up.  Diagnoses and all orders  for this visit:  Essential hypertension Comments: Well controlled.  No signs of complications, medication side effects, or red flags.  Continue current regimen.    Type 2 diabetes mellitus without complication, without long-term current use of insulin (HCC) Comments: Increased Glimiperide. See AVS. Note: Patient adamant that he wants oral medications. Gave card for EYE evaluation.  . Reviewed expectations re: course of current medical issues. . Discussed self-management of symptoms. . Outlined signs and symptoms indicating need for more acute intervention. . Patient verbalized understanding and all questions were answered. Marland Kitchen. Health Maintenance issues including appropriate healthy diet, exercise, and smoking avoidance were discussed with patient. . See orders for this visit as documented in the electronic medical record. . Patient received an After Visit Summary.  Helane RimaErica Dodie Parisi, DO [place] 07/07/2017  Future Appointments  Date Time Provider Department Center  05/13/2018 11:00 AM Sherrye Payorrummond, Cassandra J, RN [facility] None  05/13/2018 11:30 AM Helane RimaWallace, Francesa Eugenio, DO [facility] None

## 2017-07-07 NOTE — Patient Instructions (Signed)
Increase your Glimepiride to 1.5 tabs daily x 1 week, then increase to 2 tabs daily.  If this dose works well, I will call in the 4 mg tablets.

## 2017-08-26 ENCOUNTER — Other Ambulatory Visit: Payer: Self-pay | Admitting: Family Medicine

## 2017-08-26 MED ORDER — SITAGLIP PHOS-METFORMIN HCL ER 100-1000 MG PO TB24: 1.0000 | ORAL_TABLET | Freq: Every day | ORAL | 0 refills | Status: DC

## 2017-08-26 NOTE — Telephone Encounter (Signed)
Called in 7 days per request in message. Not able to leave v/m at this time.

## 2017-08-26 NOTE — Telephone Encounter (Signed)
Needs order. Routing back to provider.

## 2017-08-26 NOTE — Telephone Encounter (Signed)
See note

## 2017-08-26 NOTE — Telephone Encounter (Signed)
Copied from CRM [phone]#[id number]. Topic: Quick Communication - Rx Refill/Question >> Aug 26, 2017  1:40 PM Viviann SpareWhite, Selina wrote: Medication:  SitaGLIPtin-MetFORMIN HCl (JANUMET XR) [phone] MG Tb24   (PATIENT SON CALLED AND STATED THAT HE ORDER THRU MAIL ORDER BUT WILL TAKE A 7 DAYS. HE WOULD LIKE TO KNOW IF Dr. Earlene PlaterWALLACE COULD SEND A 7 DAYS SUPPLY TO THE PHARMACY BELOW). PATIENT IS OUT OF HIS MED   Has the patient contacted their pharmacy? Yes.     (Agent: If no, request that the patient contact the pharmacy for the refill.)   Preferred Pharmacy (with phone number or street name):   [facility] [address] - [place], [place] - [id number] [address] [place] [id number] Phone: [phone] Fax: [phone]    Agent: Please be advised that RX refills may take up to 3 business days. We ask that you follow-up with your pharmacy.

## 2017-09-04 DIAGNOSIS — H52223 Regular astigmatism, bilateral: Secondary | ICD-10-CM | POA: Diagnosis not present

## 2017-09-04 DIAGNOSIS — H524 Presbyopia: Secondary | ICD-10-CM | POA: Diagnosis not present

## 2017-09-04 DIAGNOSIS — H5211 Myopia, right eye: Secondary | ICD-10-CM | POA: Diagnosis not present

## 2017-09-04 LAB — HM DIABETES EYE EXAM

## 2017-10-01 ENCOUNTER — Other Ambulatory Visit: Payer: Self-pay

## 2017-10-01 MED ORDER — BLOOD GLUCOSE MONITOR KIT: PACK | 0 refills | Status: DC

## 2017-10-02 ENCOUNTER — Other Ambulatory Visit: Payer: Self-pay

## 2017-10-02 MED ORDER — PIOGLITAZONE HCL 30 MG PO TABS: 30.0000 mg | ORAL_TABLET | Freq: Every day | ORAL | 1 refills | Status: DC

## 2017-10-02 MED ORDER — GLIMEPIRIDE 2 MG PO TABS: 2.0000 mg | ORAL_TABLET | Freq: Every day | ORAL | 1 refills | Status: DC

## 2017-10-08 ENCOUNTER — Ambulatory Visit (INDEPENDENT_AMBULATORY_CARE_PROVIDER_SITE_OTHER): Payer: Medicare Other

## 2017-10-08 ENCOUNTER — Encounter: Payer: Self-pay | Admitting: Family Medicine

## 2017-10-08 ENCOUNTER — Ambulatory Visit: Payer: Medicare Other | Admitting: Family Medicine

## 2017-10-08 VITALS — BP 144/78 | HR 85 | Temp 98.3°F | Ht 64.0 in | Wt 151.8 lb

## 2017-10-08 DIAGNOSIS — E1169 Type 2 diabetes mellitus with other specified complication: Secondary | ICD-10-CM

## 2017-10-08 DIAGNOSIS — E782 Mixed hyperlipidemia: Secondary | ICD-10-CM

## 2017-10-08 DIAGNOSIS — R0789 Other chest pain: Secondary | ICD-10-CM

## 2017-10-08 DIAGNOSIS — I10 Essential (primary) hypertension: Secondary | ICD-10-CM | POA: Diagnosis not present

## 2017-10-08 DIAGNOSIS — R05 Cough: Secondary | ICD-10-CM

## 2017-10-08 DIAGNOSIS — R0602 Shortness of breath: Secondary | ICD-10-CM | POA: Diagnosis not present

## 2017-10-08 DIAGNOSIS — R059 Cough, unspecified: Secondary | ICD-10-CM

## 2017-10-08 LAB — POCT GLYCOSYLATED HEMOGLOBIN (HGB A1C): Hemoglobin A1C: 8.2

## 2017-10-08 MED ORDER — PIOGLITAZONE HCL 30 MG PO TABS: 30.0000 mg | ORAL_TABLET | Freq: Every day | ORAL | 1 refills | Status: DC

## 2017-10-08 MED ORDER — GLIMEPIRIDE 2 MG PO TABS: 2.0000 mg | ORAL_TABLET | Freq: Every day | ORAL | 1 refills | Status: DC

## 2017-10-08 MED ORDER — AMOXICILLIN-POT CLAVULANATE 875-125 MG PO TABS: 1.0000 | ORAL_TABLET | Freq: Two times a day (BID) | ORAL | 0 refills | Status: DC

## 2017-10-08 NOTE — Progress Notes (Signed)
Aaron Blair is a 84 y.o. male is here for follow up.  History of Present Illness:   HPI:   1. Essential hypertension.  Patient continues his lisinopril hydrochlorothiazide and Norvasc.  He is stable on the medication.  He does check his blood pressure at times and notes that it is generally in the 130s-140s systolic.  Today, he denies any chest pain, shortness of breath, edema.  His work includes lots of walking and going up and down steps.  He never has trouble with chest pain or shortness of breath doing these activities.  He does mention that he has had 2 episodes in the past 2 months of chest pain that he describes as sharp.  He points to his left chest.  He denies any radiation.  He states it lasts about 2 minutes.  Both episodes happened while sitting.   2. Type 2 diabetes mellitus with other specified complication, without long-term current use of insulin (HCC).  A1c remains at 8.  He continues to take his Actos, Janumet, and Amaryl.  He does check his blood sugar regularly and notes how it can be labile with food indiscretion.  Weight is stable.  No concerns.   3. Mixed hyperlipidemia.   Lab Results  Component Value Date   CHOL 148 12/17/2016   HDL 30 (L) 12/17/2016   LDLCALC 96 12/17/2016   TRIG 110 12/17/2016   CHOLHDL 4.9 12/17/2016      Also, he has additional complaints of flulike symptoms about a week ago.  He had an acute onset of fever, chills, cough, malaise.  This lasted about 5 days and has started improving.  He does continue to cough that has now started to worsen it is associated with chest congestion.  Review of Systems  Constitutional: Positive for chills and fever.  Respiratory: Positive for cough. Negative for wheezing.   Cardiovascular: Positive for chest pain. Negative for palpitations.  Gastrointestinal: Negative for abdominal pain, constipation, diarrhea, nausea and vomiting.  Genitourinary: Negative for dysuria.  Musculoskeletal: Negative for myalgias.   Neurological: Negative for dizziness.    The ASCVD Risk score Denman George DC Jr., et al., 2013) failed to calculate for the following reasons:   The 2013 ASCVD risk score is only valid for ages 3 to 80  There are no preventive care reminders to display for this patient.   Depression screen PHQ 2/9 05/12/2017  Decreased Interest 0  Down, Depressed, Hopeless 0  PHQ - 2 Score 0   PMHx, SurgHx, SocialHx, FamHx, Medications, and Allergies were reviewed in the Visit Navigator and updated as appropriate.   Patient Active Problem List   Diagnosis Date Noted  . Former smoker 03/23/2017  . Diastolic dysfunction 03/02/2017  . Diabetes mellitus (HCC) 02/23/2017  . Essential hypertension 12/12/2016  . Mixed hyperlipidemia 12/12/2016  . Type 2 diabetes mellitus without complication, without long-term current use of insulin (HCC) 12/12/2016  . Left carotid stenosis 07/17/2016   Social History   Tobacco Use  . Smoking status: Former Smoker    Years: 60.00    Last attempt to quit: 06/28/2011    Years since quitting: 6.2  . Smokeless tobacco: Never Used  . Tobacco comment: Quit 3 years ago.  Substance Use Topics  . Alcohol use: No    Alcohol/week: 0.0 oz  . Drug use: No   Current Medications and Allergies:   .  amLODipine (NORVASC) 10 MG tablet, Take 1 tablet (10 mg total) by mouth daily., Disp: 90 tablet,  Rfl: 0 .  aspirin 81 MG tablet, Take 81 mg by mouth daily., Disp: , Rfl:  .  glimepiride (AMARYL) 2 MG tablet, Take 1 tablet (2 mg total) by mouth daily with breakfast., Disp: 90 tablet, Rfl: 1 .  lisinopril-hydrochlorothiazide (PRINZIDE,ZESTORETIC) 20-25 MG tablet, Take 1 tablet by mouth daily., Disp: 90 tablet, Rfl: 1 .  pioglitazone (ACTOS) 30 MG tablet, Take 1 tablet (30 mg total) by mouth daily., Disp: 90 tablet, Rfl: 1 .  Pitavastatin Calcium (LIVALO) 4 MG TABS, Take 1 tablet by mouth daily., Disp: , Rfl:  .  SitaGLIPtin-MetFORMIN HCl (JANUMET XR) [phone] MG TB24, Take 1 tablet by  mouth daily. , Disp: , Rfl:   Allergies  Allergen Reactions  . No Known Allergies    Review of Systems   Pertinent items are noted in the HPI. Otherwise, ROS is negative.  Vitals:   Vitals:   10/08/17 1134  BP: (!) 144/78  Pulse: 85  Temp: 98.3 F (36.8 C)  TempSrc: Oral  Weight: 151 lb 12.8 oz (68.9 kg)  Height: 5\' 4"  (1.626 m)     Body mass index is 26.06 kg/m.  Physical Exam:   Physical Exam  Constitutional: He is oriented to person, place, and time. He appears well-developed and well-nourished. No distress.  HENT:  Head: Normocephalic and atraumatic.  Right Ear: External ear normal.  Left Ear: External ear normal.  Nose: Nose normal.  Mouth/Throat: Oropharynx is clear and moist.  Eyes: Conjunctivae and EOM are normal. Pupils are equal, round, and reactive to light.  Neck: Normal range of motion. Neck supple.  Cardiovascular: Normal rate, regular rhythm, normal heart sounds and intact distal pulses.  Pulmonary/Chest: Effort normal and breath sounds normal.  Abdominal: Soft. Bowel sounds are normal.  Musculoskeletal: Normal range of motion.  Neurological: He is alert and oriented to person, place, and time.  Skin: Skin is warm and dry.  Psychiatric: He has a normal mood and affect. His behavior is normal. Judgment and thought content normal.  Nursing note and vitals reviewed.     Assessment and Plan:   1. Essential hypertension Stable.  Will continue current treatment.  Medications refilled.  2. Type 2 diabetes mellitus with other specified complication, without long-term current use of insulin (HCC) Stable.  Will continue current treatment.  Medications refilled.  - POCT glycosylated hemoglobin (Hb A1C) - pioglitazone (ACTOS) 30 MG tablet; Take 1 tablet (30 mg total) by mouth daily.  Dispense: 90 tablet; Refill: 1 - glimepiride (AMARYL) 2 MG tablet; Take 1 tablet (2 mg total) by mouth daily with breakfast.  Dispense: 90 tablet; Refill: 1  3. Mixed  hyperlipidemia Stable.  Will continue current treatment.  Medications refilled.  4. Atypical chest pain New.  Because the patient has such an active job and has no chest pain while at work, I doubt that this chest pain was cardiac.  However, he is high risk.  We will continue to try to maximize risk reduction.  Will consider sending to cardiology for evaluation as well.  Chest x-ray and EKG today.  - DG Chest 2 View - EKG 12-Lead  5. Cough Chest congestion auscultated today.  Based on recent history, okay to treat with Augmentin.  Chest x-ray still pending.  - DG Chest 2 View - amoxicillin-clavulanate (AUGMENTIN) 875-125 MG tablet; Take 1 tablet by mouth 2 (two) times daily.  Dispense: 20 tablet; Refill: 0   . Reviewed expectations re: course of current medical issues. . Discussed self-management of symptoms. .Marland Kitchen  Outlined signs and symptoms indicating need for more acute intervention. . Patient verbalized understanding and all questions were answered. Marland Kitchen Health Maintenance issues including appropriate healthy diet, exercise, and smoking avoidance were discussed with patient. . See orders for this visit as documented in the electronic medical record. . Patient received an After Visit Summary.   Helane Rima, DO [place] 10/08/2017

## 2017-10-09 ENCOUNTER — Telehealth: Payer: Self-pay | Admitting: Family Medicine

## 2017-10-09 NOTE — Telephone Encounter (Signed)
Spoke with patients son. Please see result note.

## 2017-10-09 NOTE — Telephone Encounter (Signed)
Copied from CRM [phone]. Topic: Quick Communication - Other Results >> Oct 09, 2017 10:33 AM Donnamarie Poaghompson, Joellen Y, CMA wrote: Called patient to inform them of 08/07/18 results. When patient returns call, triage nurse may disclose results. >> Oct 09, 2017 10:48 AM Lelon FrohlichGolden, Hunt Zajicek, RMA wrote: Triage did not see a note to disclose results so I am sending back to the office Please contact pt daughter Marcelino DusterMichelle [phone]

## 2017-10-09 NOTE — Telephone Encounter (Signed)
See note

## 2017-10-09 NOTE — Telephone Encounter (Signed)
See note.   Copied from CRM [phone]. Topic: Quick Communication - Other Results >> Oct 09, 2017 10:33 AM Donnamarie Poaghompson, Joellen Y, CMA wrote: Called patient to inform them of 08/07/18 results. When patient returns call, triage nurse may disclose results. >> Oct 09, 2017 10:48 AM Lelon FrohlichGolden, Tashia, RMA wrote: Triage did not see a note to disclose results so I am sending back to the office Please contact pt daughter Marcelino DusterMichelle [phone]

## 2017-10-20 ENCOUNTER — Ambulatory Visit (INDEPENDENT_AMBULATORY_CARE_PROVIDER_SITE_OTHER): Payer: Medicare Other | Admitting: Cardiology

## 2017-10-20 ENCOUNTER — Encounter: Payer: Self-pay | Admitting: Cardiology

## 2017-10-20 VITALS — BP 155/80 | HR 80 | Ht 64.0 in | Wt 153.0 lb

## 2017-10-20 DIAGNOSIS — Z87891 Personal history of nicotine dependence: Secondary | ICD-10-CM | POA: Diagnosis not present

## 2017-10-20 DIAGNOSIS — I10 Essential (primary) hypertension: Secondary | ICD-10-CM | POA: Diagnosis not present

## 2017-10-20 DIAGNOSIS — R079 Chest pain, unspecified: Secondary | ICD-10-CM

## 2017-10-20 DIAGNOSIS — E782 Mixed hyperlipidemia: Secondary | ICD-10-CM

## 2017-10-20 DIAGNOSIS — E119 Type 2 diabetes mellitus without complications: Secondary | ICD-10-CM | POA: Diagnosis not present

## 2017-10-20 MED ORDER — NITROGLYCERIN 0.4 MG SL SUBL: 0.4000 mg | SUBLINGUAL_TABLET | SUBLINGUAL | 6 refills | Status: DC | PRN

## 2017-10-20 NOTE — Progress Notes (Signed)
Cardiology Office Note:    Date:  10/20/2017   ID:  JOAL EAKLE, DOB [birth year], MRN [id number]  PCP:  Briscoe Deutscher, DO  Cardiologist:  Jenean Lindau, MD   Referring MD: Briscoe Deutscher, DO    ASSESSMENT:    1. Essential hypertension   2. Type 2 diabetes mellitus without complication, without long-term current use of insulin ([place])   3. Former smoker   4. Mixed hyperlipidemia   5. Chest pain, unspecified type    PLAN:    In order of problems listed above:  1. Secondary prevention stressed with the patient.  Importance of compliance with diet and medications stressed and he vocalized understanding.  His blood pressure is a little elevated but he has an element of whitecoat hypertension and his primary care physician will monitor his blood pressures.  Diet was discussed for diabetes mellitus and dyslipidemia.  I am not sure whether he is on a statin 1 of his records states that he is on it.  I told him to get in touch with his primary care provider about statin therapy and clearly it is indicated in him because he is a diabetic and also has documented atherosclerotic vascular disease as he is post carotid endarterectomy on the left side. 2. In view of his symptoms he will undergo exercise stress Cardiolite. 3. Sublingual nitroglycerin prescription was sent, its protocol and 911 protocol explained and the patient vocalized understanding questions were answered to the patient's satisfaction 4. Patient will be seen in follow-up appointment in 6 months or earlier if the patient has any concerns. 5. I told him never to go back to smoking and he agrees.    Medication Adjustments/Labs and Tests Ordered: Current medicines are reviewed at length with the patient today.  Concerns regarding medicines are outlined above.  Orders Placed This Encounter  Procedures  . MYOCARDIAL PERFUSION IMAGING   Meds ordered this encounter  Medications  . nitroGLYCERIN (NITROSTAT) 0.4 MG SL tablet   Sig: Place 1 tablet (0.4 mg total) under the tongue every 5 (five) minutes as needed.    Dispense:  11 tablet    Refill:  6     History of Present Illness:    Aaron Blair is a 84 y.o. male who is being seen today for the evaluation of chest pain at the request of Briscoe Deutscher, DO.  Patient is a pleasant 84 year old male.  He is accompanied by his daughter.  He has past medical history of essential hypertension, dyslipidemia and diabetes mellitus.  He also has undergone left carotid endarterectomy.  He mentions to me that he has pinprick-like sensation in his chest.  This may or may not be related to exertion.  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  No radiation to the neck or to the arms this is going on for the past several weeks.  At the time of my evaluation, the patient is alert awake oriented and in no distress.  Past Medical History:  Diagnosis Date  . Chronic gastritis   . Diabetes type 2, uncontrolled ([place])   . Hyperlipidemia   . Hypertension   . Lumbar herniated disc, L3-4   . Vertigo     Past Surgical History:  Procedure Laterality Date  . BACK SURGERY    . COLONOSCOPY    . ENDARTERECTOMY Left 07/17/2016   Procedure: ENDARTERECTOMY CAROTID;  Surgeon: Elam Dutch,  MD;  Location: [facility];  Service: Vascular;  Laterality: Left;  . SPINE SURGERY  02/05/2016    Current Medications: Current Meds  Medication Sig  . amLODipine (NORVASC) 10 MG tablet Take 1 tablet (10 mg total) by mouth daily.  Marland Kitchen amoxicillin-clavulanate (AUGMENTIN) 875-125 MG tablet Take 1 tablet by mouth 2 (two) times daily.  Marland Kitchen aspirin 81 MG tablet Take 81 mg by mouth daily.  . blood glucose meter kit and supplies KIT Dispense based on patient and insurance preference. Use up to four times daily as directed. (FOR ICD-9 250.00, 250.01).  Marland Kitchen glimepiride (AMARYL) 2 MG tablet Take 1 tablet (2 mg total) by mouth daily with  breakfast.  . hydrochlorothiazide (HYDRODIURIL) 25 MG tablet Take 1 tablet (25 mg total) by mouth daily.  Marland Kitchen lisinopril-hydrochlorothiazide (PRINZIDE,ZESTORETIC) 20-25 MG tablet Take 1 tablet by mouth daily.  . pioglitazone (ACTOS) 30 MG tablet Take 1 tablet (30 mg total) by mouth daily.  . SitaGLIPtin-MetFORMIN HCl (JANUMET XR) [phone] MG TB24 Take 1 tablet by mouth daily.      Allergies:   No known allergies   Social History   Socioeconomic History  . Marital status: Married    Spouse name: None  . Number of children: 3  . Years of education: 13  . Highest education level: None  Social Needs  . Financial resource strain: None  . Food insecurity - worry: None  . Food insecurity - inability: None  . Transportation needs - medical: None  . Transportation needs - non-medical: None  Occupational History  . Occupation: Owns Data processing manager business  Tobacco Use  . Smoking status: Former Smoker    Years: 60.00    Last attempt to quit: 06/28/2011    Years since quitting: 6.3  . Smokeless tobacco: Never Used  . Tobacco comment: Quit 3 years ago.  Substance and Sexual Activity  . Alcohol use: No    Alcohol/week: 0.0 oz  . Drug use: No  . Sexual activity: None  Other Topics Concern  . None  Social History Narrative   Lives at home with his wife and son.   Right-handed.   1 cup caffeine per day.     Family History: The patient's family history is not on file.  ROS:   Please see the history of present illness.    All other systems reviewed and are negative.  EKGs/Labs/Other Studies Reviewed:    The following studies were reviewed today: I reviewed the patient's records and lab work.   Recent Labs: 12/17/2016: Magnesium 1.7 02/05/2017: Hemoglobin 13.9; Platelets 198.0 03/11/2017: Pro B Natriuretic peptide (BNP) 51.0 05/12/2017: ALT 12; BUN 17; Creatinine, Ser 0.83; Potassium 4.0; Sodium 136  Recent Lipid Panel    Component Value Date/Time   CHOL 148 12/17/2016  0706   TRIG 110 12/17/2016 0706   HDL 30 (L) 12/17/2016 0706   CHOLHDL 4.9 12/17/2016 0706   VLDL 22 12/17/2016 0706   LDLCALC 96 12/17/2016 0706    Physical Exam:    VS:  BP (!) 155/80 (BP Location: Left Arm, Patient Position: Sitting, Cuff Size: Normal)   Pulse 80   Ht 5' 4" (1.626 m)   Wt 153 lb (69.4 kg)   SpO2 97%   BMI 26.26 kg/m     Wt Readings from Last 3 Encounters:  10/20/17 153 lb (69.4 kg)  10/08/17 151 lb 12.8 oz (68.9 kg)  07/07/17 154 lb 9.6 oz (70.1 kg)     GEN: Patient is in no acute  distress HEENT: Normal NECK: No JVD; No carotid bruits LYMPHATICS: No lymphadenopathy CARDIAC: S1 S2 regular, 2/6 systolic murmur at the apex. RESPIRATORY:  Clear to auscultation without rales, wheezing or rhonchi  ABDOMEN: Soft, non-tender, non-distended MUSCULOSKELETAL:  No edema; No deformity  SKIN: Warm and dry NEUROLOGIC:  Alert and oriented x 3 PSYCHIATRIC:  Normal affect    Signed, Jenean Lindau, MD  10/20/2017 9:22 AM    [facility]

## 2017-10-20 NOTE — Patient Instructions (Signed)
Medication Instructions:  Your physician has recommended you make the following change in your medication:  START Nitroglycerin 0.4 mg sublingual (under your tongue) as needed for chest pain. If experiencing chest pain, stop what you are doing and sit down. Take 1 nitroglycerin and wait 5 minutes. If chest pain continues, take another nitroglycerin and wait 5 minutes. If chest pain does not subside, take 1 more nitroglycerin and dial 911. You make take a total of 3 nitroglycerin in a 15 minute time frame.  Labwork: None  Testing/Procedures: Your physician has requested that you have en exercise stress myoview. For further information please visit [email or web address]. Please follow instruction sheet, as given.  Follow-Up: Your physician recommends that you schedule a follow-up appointment in: 6 months  Any Other Special Instructions Will Be Listed Below (If Applicable).     If you need a refill on your cardiac medications before your next appointment, please call your pharmacy.   [facility]  Ashley A, RN, BSN  

## 2017-10-21 ENCOUNTER — Telehealth: Payer: Self-pay | Admitting: Cardiology

## 2017-10-21 ENCOUNTER — Telehealth: Payer: Self-pay | Admitting: Family Medicine

## 2017-10-21 NOTE — Telephone Encounter (Signed)
See note.  Copied from CRM [phone]. Topic: Referral - Question >> Oct 21, 2017 12:22 PM Windy KalataMichael, Taylor L, NT wrote: Reason for patient son is calling in regards to a stress test referral. States patient had a stress test done last year and would like to confirm that he needs another one. Please contact Jae.

## 2017-10-21 NOTE — Telephone Encounter (Signed)
Patient's daughter states that he had  The nuclear stress last year and not had any cp since the test.. Does he still need to have test?

## 2017-10-21 NOTE — Telephone Encounter (Signed)
Informed daughter that the test had not been completed since 2017. Daughter stated that she would discuss it with her father and call back.

## 2017-10-21 NOTE — Telephone Encounter (Signed)
[place] and let him know that stress test was indicated by cardiology when in office yesterday per his note. They are fine with having and they are aware that cardiology will order. I did see in his note the following that he was told to check with our office about wanted to run by you and see if any med changes are needed. Informed son that if we needed to change anything we will call back:   1. I am not sure whether he is on a statin 1 of his records states that he is on it.  I told him to get in touch with his primary care provider about statin therapy

## 2017-10-28 ENCOUNTER — Telehealth (HOSPITAL_COMMUNITY): Payer: Self-pay | Admitting: *Deleted

## 2017-10-28 NOTE — Telephone Encounter (Signed)
Patient's son  given detailed instructions per Myocardial Perfusion Study Information Sheet for the test on 10/30/17 at 0745. Patient notified to arrive 15 minutes early and that it is imperative to arrive on time for appointment to keep from having the test rescheduled.  If you need to cancel or reschedule your appointment, please call the office within 24 hours of your appointment. . Patient verbalized understanding.Kristal Perl, Adelene IdlerCynthia W

## 2017-10-30 ENCOUNTER — Ambulatory Visit (HOSPITAL_COMMUNITY): Payer: Medicare Other | Attending: Cardiovascular Disease

## 2017-10-30 VITALS — Ht 64.0 in | Wt 153.0 lb

## 2017-10-30 DIAGNOSIS — R079 Chest pain, unspecified: Secondary | ICD-10-CM | POA: Diagnosis not present

## 2017-10-30 LAB — MYOCARDIAL PERFUSION IMAGING
CHL CUP RESTING HR STRESS: 74 {beats}/min
CSEPPHR: 84 {beats}/min
LV dias vol: 74 mL (ref 62–150)
LV sys vol: 27 mL
RATE: 0.28
SDS: 2
SRS: 2
SSS: 4
TID: 0.97

## 2017-10-30 MED ORDER — TECHNETIUM TC 99M TETROFOSMIN IV KIT
32.4000 | PACK | Freq: Once | INTRAVENOUS | Status: AC | PRN
  Administered 2017-10-30: 32.4 via INTRAVENOUS
  Filled 2017-10-30: qty 33

## 2017-10-30 MED ORDER — REGADENOSON 0.4 MG/5ML IV SOLN
0.4000 mg | Freq: Once | INTRAVENOUS | Status: AC
  Administered 2017-10-30: 0.4 mg via INTRAVENOUS

## 2017-10-30 MED ORDER — TECHNETIUM TC 99M TETROFOSMIN IV KIT
11.0000 | PACK | Freq: Once | INTRAVENOUS | Status: AC | PRN
  Administered 2017-10-30: 11 via INTRAVENOUS
  Filled 2017-10-30: qty 11

## 2017-10-30 NOTE — Progress Notes (Unsigned)
[facility] interpreter present for procedure-Aaron Blair.

## 2017-11-07 ENCOUNTER — Telehealth: Payer: Self-pay | Admitting: Family Medicine

## 2017-11-07 NOTE — Telephone Encounter (Signed)
Current Outpatient Medications:  .  amLODipine (NORVASC) 10 MG tablet, Take 1 tablet (10 mg total) by mouth daily., Disp: 90 tablet, Rfl: 0 .  aspirin 81 MG tablet, Take 81 mg by mouth daily., Disp: , Rfl:  .  glimepiride (AMARYL) 2 MG tablet, Take 1 tablet (2 mg total) by mouth daily with breakfast., Disp: 90 tablet, Rfl: 1 .  hydrochlorothiazide (HYDRODIURIL) 25 MG tablet, Take 1 tablet (25 mg total) by mouth daily., Disp: 90 tablet, Rfl: 3 .  lisinopril-hydrochlorothiazide (PRINZIDE,ZESTORETIC) 20-25 MG tablet, Take 1 tablet by mouth daily., Disp: 90 tablet, Rfl: 1 .  nitroGLYCERIN (NITROSTAT) 0.4 MG SL tablet, Place 1 tablet (0.4 mg total) under the tongue every 5 (five) minutes as needed., Disp: 11 tablet, Rfl: 6 .  pioglitazone (ACTOS) 30 MG tablet, Take 1 tablet (30 mg total) by mouth daily., Disp: 90 tablet, Rfl: 1 .  SitaGLIPtin-MetFORMIN HCl (JANUMET XR) [phone] MG TB24, Take 1 tablet by mouth daily. , Disp: , Rfl:   STATIN: Previously on statin but unable to afford. Possible intolerance to others? I can send in another medication.

## 2017-11-07 NOTE — Telephone Encounter (Signed)
Copied from CRM #[id number]. Topic: Quick Communication [phone] Rx Refill/Question >> Nov 07, 2017 11:28 AM Raquel SarnaHayes, Teresa G wrote: Losartan hydrochlorothiazide 100 - 12.5 mg  Needs verification for pt being prescribed Lisinopril - can cause a drug interaction.  [facility] - [phone] Ref # [id number]

## 2017-11-07 NOTE — Telephone Encounter (Signed)
Will route to office for provider review. 

## 2017-11-07 NOTE — Telephone Encounter (Signed)
Called mail order the only scripts they have filled are as followed:  Actos 09/2017 Janumet XR 2/19, 1/19 Glimepiride that was d/c 12/18 Called son to see where other scripts are filled. [facility] he needs something right away. I have called [facility] to see what he has filled. They have faxed list to me and as followed.  Nitroglycerin and Hydrochlorothiazide 25mg  and Lisinopril 40mg .

## 2017-11-07 NOTE — Telephone Encounter (Signed)
Please advise looks like patient has been on for while.

## 2017-11-18 ENCOUNTER — Other Ambulatory Visit: Payer: Self-pay

## 2017-11-18 MED ORDER — GLUCOSE BLOOD VI STRP: ORAL_STRIP | 12 refills | Status: DC

## 2017-11-18 MED ORDER — ONETOUCH DELICA LANCETS 33G MISC: 1 refills | Status: DC

## 2017-12-31 ENCOUNTER — Encounter: Payer: Self-pay | Admitting: Family Medicine

## 2017-12-31 ENCOUNTER — Ambulatory Visit (INDEPENDENT_AMBULATORY_CARE_PROVIDER_SITE_OTHER): Payer: Medicare Other | Admitting: Family Medicine

## 2017-12-31 VITALS — BP 160/98 | HR 82 | Temp 97.8°F | Ht 64.0 in | Wt 153.0 lb

## 2017-12-31 DIAGNOSIS — I10 Essential (primary) hypertension: Secondary | ICD-10-CM | POA: Diagnosis not present

## 2017-12-31 DIAGNOSIS — E119 Type 2 diabetes mellitus without complications: Secondary | ICD-10-CM

## 2017-12-31 DIAGNOSIS — Z79899 Other long term (current) drug therapy: Secondary | ICD-10-CM | POA: Diagnosis not present

## 2017-12-31 DIAGNOSIS — E782 Mixed hyperlipidemia: Secondary | ICD-10-CM

## 2017-12-31 LAB — COMPREHENSIVE METABOLIC PANEL
ALT: 14 U/L (ref 0–53)
AST: 15 U/L (ref 0–37)
Albumin: 3.7 g/dL (ref 3.5–5.2)
Alkaline Phosphatase: 45 U/L (ref 39–117)
BUN: 18 mg/dL (ref 6–23)
CO2: 29 mEq/L (ref 19–32)
Calcium: 9.4 mg/dL (ref 8.4–10.5)
Chloride: 101 mEq/L (ref 96–112)
Creatinine, Ser: 0.84 mg/dL (ref 0.40–1.50)
GFR: 92.98 mL/min (ref >60.00)
Glucose, Bld: 142 mg/dL — ABNORMAL HIGH (ref 70–99)
Potassium: 4 mEq/L (ref 3.5–5.1)
Sodium: 136 mEq/L (ref 135–145)
Total Bilirubin: 0.8 mg/dL (ref 0.2–1.2)
Total Protein: 6.9 g/dL (ref 6.0–8.3)

## 2017-12-31 LAB — CBC WITH DIFFERENTIAL/PLATELET
Basophils Absolute: 0 10*3/uL (ref 0.0–0.1)
Basophils Relative: 0.7 % (ref 0.0–3.0)
Eosinophils Absolute: 0.4 10*3/uL (ref 0.0–0.7)
Eosinophils Relative: 7.6 % — ABNORMAL HIGH (ref 0.0–5.0)
HCT: 43.5 % (ref 39.0–52.0)
Hemoglobin: 14.7 g/dL (ref 13.0–17.0)
Lymphocytes Relative: 24.5 % (ref 12.0–46.0)
Lymphs Abs: 1.4 10*3/uL (ref 0.7–4.0)
MCHC: 33.7 g/dL (ref 30.0–36.0)
MCV: 95.7 fl (ref 78.0–100.0)
Monocytes Absolute: 0.4 10*3/uL (ref 0.1–1.0)
Monocytes Relative: 7.4 % (ref 3.0–12.0)
Neutro Abs: 3.4 10*3/uL (ref 1.4–7.7)
Neutrophils Relative %: 59.8 % (ref 43.0–77.0)
Platelets: 167 10*3/uL (ref 150.0–400.0)
RBC: 4.54 Mil/uL (ref 4.22–5.81)
RDW: 13.5 % (ref 11.5–15.5)
WBC: 5.7 10*3/uL (ref 4.0–10.5)

## 2017-12-31 LAB — LIPID PANEL
Cholesterol: 270 mg/dL — ABNORMAL HIGH (ref 0–200)
HDL: 48.7 mg/dL (ref >39.00)
NonHDL: 220.94
Total CHOL/HDL Ratio: 6
Triglycerides: 234 mg/dL — ABNORMAL HIGH (ref 0.0–149.0)
VLDL: 46.8 mg/dL — ABNORMAL HIGH (ref 0.0–40.0)

## 2017-12-31 LAB — VITAMIN B12: Vitamin B-12: 366 pg/mL (ref 211–911)

## 2017-12-31 LAB — LDL CHOLESTEROL, DIRECT: Direct LDL: 179 mg/dL

## 2017-12-31 LAB — HEMOGLOBIN A1C: Hgb A1c MFr Bld: 7.8 % — ABNORMAL HIGH (ref 4.6–6.5)

## 2017-12-31 MED ORDER — METFORMIN HCL ER 500 MG PO TB24: 1000.0000 mg | ORAL_TABLET | Freq: Every day | ORAL | 3 refills | Status: DC

## 2017-12-31 MED ORDER — LISINOPRIL 20 MG PO TABS: 20.0000 mg | ORAL_TABLET | Freq: Every day | ORAL | 3 refills | Status: DC

## 2017-12-31 MED ORDER — AMLODIPINE BESYLATE 5 MG PO TABS: 5.0000 mg | ORAL_TABLET | Freq: Every day | ORAL | 2 refills | Status: DC

## 2017-12-31 MED ORDER — HYDROCHLOROTHIAZIDE 25 MG PO TABS
25.0000 mg | ORAL_TABLET | Freq: Every day | ORAL | 3 refills | Status: DC
Start: 2017-12-31 — End: 2019-02-10

## 2017-12-31 MED ORDER — LISINOPRIL 40 MG PO TABS: 40.0000 mg | ORAL_TABLET | Freq: Every day | ORAL | 3 refills | Status: DC

## 2017-12-31 MED ORDER — PIOGLITAZONE HCL 45 MG PO TABS: 45.0000 mg | ORAL_TABLET | Freq: Every day | ORAL | 3 refills | Status: DC

## 2017-12-31 MED ORDER — GLIMEPIRIDE 4 MG PO TABS: 4.0000 mg | ORAL_TABLET | Freq: Every day | ORAL | 3 refills | Status: DC

## 2017-12-31 NOTE — Progress Notes (Signed)
Aaron Blair is a 84 y.o. male is here for follow up.  History of Present Illness:   HPI:  Diabetes. Patient is unable to afford Janumet. Wants to change regimen to adjust for cost.   Current symptoms: no polyuria or polydipsia, no chest pain, dyspnea or TIA's, no numbness, tingling or pain in extremities, no unusual visual symptoms, no hypoglycemia.   Lab Results  Component Value Date   HGBA1C 7.8 (H) 12/31/2017    No results found for: Aaron Blair  Lab Results  Component Value Date   CHOL 270 (H) 12/31/2017   HDL 48.70 12/31/2017   LDLCALC 96 12/17/2016   LDLDIRECT 179.0 12/31/2017   TRIG 234.0 (H) 12/31/2017   CHOLHDL 6 12/31/2017     Wt Readings from Last 3 Encounters:  12/31/17 153 lb (69.4 kg)  10/30/17 153 lb (69.4 kg)  10/20/17 153 lb (69.4 kg)   BP Readings from Last 3 Encounters:  12/31/17 (!) 160/98  10/20/17 (!) 155/80  10/08/17 (!) 144/78   Lab Results  Component Value Date   CREATININE 0.84 12/31/2017   There are no preventive care reminders to display for this patient. Depression screen PHQ 2/9 05/12/2017  Decreased Interest 0  Down, Depressed, Hopeless 0  PHQ - 2 Score 0   PMHx, SurgHx, SocialHx, FamHx, Medications, and Allergies were reviewed in the Visit Navigator and updated as appropriate.   Patient Active Problem List   Diagnosis Date Noted  . Chest pain 10/20/2017  . Former smoker 03/23/2017  . Diastolic dysfunction 47/65/4650  . Diabetes mellitus ([place]) 02/23/2017  . Essential hypertension 12/12/2016  . Mixed hyperlipidemia 12/12/2016  . Type 2 diabetes mellitus without complication, without long-term current use of insulin ([place]) 12/12/2016  . Left carotid stenosis 07/17/2016   Social History   Tobacco Use  . Smoking status: Former Smoker    Years: 60.00    Last attempt to quit: 06/28/2011    Years since quitting: 6.5  . Smokeless tobacco: Never Used  . Tobacco comment: Quit 3 years ago.  Substance Use Topics  .  Alcohol use: No    Alcohol/week: 0.0 oz  . Drug use: No   Current Medications and Allergies:   .  amLODipine (NORVASC) 10 MG tablet, Take 1 tablet (10 mg total) by mouth daily., Disp: 90 tablet, Rfl: 0 .  amoxicillin-clavulanate (AUGMENTIN) 875-125 MG tablet, Take 1 tablet by mouth 2 (two) times daily., Disp: 20 tablet, Rfl: 0 .  aspirin 81 MG tablet, Take 81 mg by mouth daily., Disp: , Rfl:  .  blood glucose meter kit and supplies KIT, Dispense based on patient and insurance preference. Use up to four times daily as directed. (FOR ICD-9 250.00, 250.01)., Disp: 1 each, Rfl: 0 .  glimepiride (AMARYL) 2 MG tablet, Take 1 tablet (2 mg total) by mouth daily with breakfast., Disp: 90 tablet, Rfl: 1 .  glucose blood test strip, Check blood sugar once daily as directed, Disp: 100 each, Rfl: 12 .  hydrochlorothiazide (HYDRODIURIL) 25 MG tablet, Take 1 tablet (25 mg total) by mouth daily., Disp: 90 tablet, Rfl: 3 .  lisinopril-hydrochlorothiazide (PRINZIDE,ZESTORETIC) 20-25 MG tablet, Take 1 tablet by mouth daily., Disp: 90 tablet, Rfl: 1 .  nitroGLYCERIN (NITROSTAT) 0.4 MG SL tablet, Place 1 tablet (0.4 mg total) under the tongue every 5 (five) minutes as needed., Disp: 11 tablet, Rfl: 6 .  ONETOUCH DELICA LANCETS 35W MISC, Check blood sugar once daily as directed, Disp: 200 each, Rfl: 1 .  pioglitazone (ACTOS) 30 MG tablet, Take 1 tablet (30 mg total) by mouth daily., Disp: 90 tablet, Rfl: 1 .  SitaGLIPtin-MetFORMIN HCl (JANUMET XR) [phone] MG TB24, Take 1 tablet by mouth daily. , Disp: , Rfl:     Allergies  Allergen Reactions  . No Known Allergies   . Statins    Review of Systems   Pertinent items are noted in the HPI. Otherwise, ROS is negative.  Vitals:   Vitals:   12/31/17 1042  BP: (!) 160/98  Pulse: 82  Temp: 97.8 F (36.6 C)  TempSrc: Oral  SpO2: 94%  Weight: 153 lb (69.4 kg)  Height: _0  (1.626 m)     Body mass index is 26.26 kg/m.  Physical Exam:   Physical Exam    Constitutional: He is oriented to person, place, and time. He appears well-developed and well-nourished. No distress.  HENT:  Head: Normocephalic and atraumatic.  Right Ear: External ear normal.  Left Ear: External ear normal.  Nose: Nose normal.  Mouth/Throat: Oropharynx is clear and moist.  Eyes: Pupils are equal, round, and reactive to light. Conjunctivae and EOM are normal.  Neck: Normal range of motion. Neck supple.  Cardiovascular: Normal rate, regular rhythm, normal heart sounds and intact distal pulses.  Pulmonary/Chest: Effort normal and breath sounds normal.  Abdominal: Soft. Bowel sounds are normal.  Musculoskeletal: Normal range of motion.  Neurological: He is alert and oriented to person, place, and time.  Skin: Skin is warm and dry.  Psychiatric: He has a normal mood and affect. His behavior is normal. Judgment and thought content normal.  Nursing note and vitals reviewed.   Assessment and Plan:   Aaron Blair was seen today for follow-up.  Diagnoses and all orders for this visit:  Essential hypertension Comments: Reviewed medications and blood pressure log.  Will add Norvasc today. Orders: -     amLODipine (NORVASC) 5 MG tablet; Take 1 tablet (5 mg total) by mouth daily. -     lisinopril (PRINIVIL,ZESTRIL) 40 MG tablet; Take 1 tablet (40 mg total) by mouth daily. -     hydrochlorothiazide (HYDRODIURIL) 25 MG tablet; Take 1 tablet (25 mg total) by mouth daily.  Mixed hyperlipidemia -     Comprehensive metabolic panel -     Lipid panel -     LDL cholesterol, direct  Type 2 diabetes mellitus without complication, without long-term current use of insulin ([facility]) Comments: Reviewed medications.  2 weeks samples of Janumet provided.  Then will transition. Orders: -     CBC with Differential/Platelet -     Hemoglobin A1c -     metFORMIN (GLUCOPHAGE-XR) 500 MG 24 hr tablet; Take 2 tablets (1,000 mg total) by mouth at bedtime. -     pioglitazone (ACTOS) 45 MG tablet; Take 1  tablet (45 mg total) by mouth daily. -     glimepiride (AMARYL) 4 MG tablet; Take 1 tablet (4 mg total) by mouth daily before breakfast.  Medication management -     CBC with Differential/Platelet -     Vitamin B12  . Reviewed expectations re: course of current medical issues. . Discussed self-management of symptoms. . Outlined signs and symptoms indicating need for more acute intervention. . Patient verbalized understanding and all questions were answered. Marland Kitchen Health Maintenance issues including appropriate healthy diet, exercise, and smoking avoidance were discussed with patient. . See orders for this visit as documented in the electronic medical record. . Patient received an After Visit Summary.  Briscoe Deutscher, DO  [place], [place] 01/04/2018  Future Appointments  Date Time Provider [place]  04/03/2018 10:40 AM Briscoe Deutscher, DO [facility]  05/13/2018 11:00 AM Williemae Area, RN [facility]  05/13/2018 11:40 AM Briscoe Deutscher, DO [facility]

## 2018-01-04 ENCOUNTER — Encounter: Payer: Self-pay | Admitting: Family Medicine

## 2018-01-23 ENCOUNTER — Telehealth: Payer: Self-pay | Admitting: Family Medicine

## 2018-01-23 DIAGNOSIS — E119 Type 2 diabetes mellitus without complications: Secondary | ICD-10-CM

## 2018-01-23 NOTE — Telephone Encounter (Signed)
See note.   Copied from CRM [id number]. Topic: Inquiry >> Jan 23, 2018  2:56 PM Yvonna Alanisobinson, Andra M wrote: Reason for CRM: Patient's son Emelda FearJae called stating that the new diabetic medication Dr. Earlene PlaterWallace prescribed is not working. Patient's blood sugar readings have been around 180. Jae would like a call back asap at [phone].       Thank You!!!

## 2018-01-23 NOTE — Telephone Encounter (Signed)
Please advise 

## 2018-01-23 NOTE — Telephone Encounter (Signed)
We are maxed out with the medications that he has allowed me to prescribe. Not in danger with BG at 180. Let's refer to Endocrinology at this point.

## 2018-01-26 NOTE — Addendum Note (Signed)
Addended by: Dorian PodWHEELEY, JAMIE J on: 01/26/2018 03:08 PM   Modules accepted: Orders

## 2018-01-26 NOTE — Telephone Encounter (Signed)
Left message for patient's son to return call.

## 2018-01-26 NOTE — Telephone Encounter (Signed)
Spoke with patients son about Dr. Philis PiqueWallace's message. He is OK with Endocrinology referral. I will place. He is aware that the office will call him to schedule.

## 2018-01-28 ENCOUNTER — Telehealth: Payer: Self-pay | Admitting: Internal Medicine

## 2018-01-28 NOTE — Telephone Encounter (Signed)
I do not handle new pt appointments or the referrals. Please advise

## 2018-01-28 NOTE — Telephone Encounter (Signed)
Patient is calling you concerning a new patient appt

## 2018-01-29 NOTE — Telephone Encounter (Signed)
Pt has been called and an appt has been scheudled

## 2018-03-30 ENCOUNTER — Ambulatory Visit: Payer: Medicare Other | Admitting: Endocrinology

## 2018-03-30 ENCOUNTER — Encounter: Payer: Self-pay | Admitting: Endocrinology

## 2018-03-30 VITALS — BP 158/88 | HR 76 | Ht 63.0 in | Wt 153.4 lb

## 2018-03-30 DIAGNOSIS — E7800 Pure hypercholesterolemia, unspecified: Secondary | ICD-10-CM | POA: Diagnosis not present

## 2018-03-30 DIAGNOSIS — I10 Essential (primary) hypertension: Secondary | ICD-10-CM | POA: Diagnosis not present

## 2018-03-30 DIAGNOSIS — E1165 Type 2 diabetes mellitus with hyperglycemia: Secondary | ICD-10-CM

## 2018-03-30 LAB — POCT GLYCOSYLATED HEMOGLOBIN (HGB A1C): Hemoglobin A1C: 9.5 % — AB (ref 4.0–5.6)

## 2018-03-30 MED ORDER — ACARBOSE 25 MG PO TABS: ORAL_TABLET | ORAL | 1 refills | Status: DC

## 2018-03-30 NOTE — Progress Notes (Signed)
Patient ID: Aaron Blair, male   DOB: [birth year], 84 y.o.   MRN: [id number]          Reason for Appointment: Consultation for Type 2 Diabetes  Referring physician: Parke Simmers   History of Present Illness:          Date of diagnosis of type 2 diabetes mellitus:?  2004       Background history:   He is not clear about how his diabetes was diagnosed initially or what medications he was treated with In the last 2 years he had been treated with Janumet with fair control but A1c being around 8% usually  Recent history:   Most recent A1c is 9.5, previously 7.8 in 5/19   Non-insulin hypoglycemic drugs the patient is taking are:  Current management, blood sugar patterns and problems identified:  He has been on metformin only instead of Janumet since 5/19  and this was because of his being in the donut hole and not being able to afford this  Although previously had been prescribed Trulicity in 7096 his son said that he did not try this  Previously Actos was started in 2/19 and the dose was increased in 5/19 up to 45 mg  He is not sure whether his blood sugars have been consistently high with the changes in medications but recently having more consistently around 160 fasting  He does not check his sugar usually after meal times but rarely when he has checked blood sugar it has been 250  He is thinks he is active at work but generally getting only about 3000 steps a day, does not do any specific walking on his days off  He usually avoids drinks with sugar and occasionally may eat sweets        Side effects from medications have been: None  Compliance with the medical regimen: Fair  Typical meal intake: Mostly [place] food, for breakfast mostly eating fruit      For snacks will have fruits and nuts  Exercise:  Only some walking at work  Glucose monitoring:  done 1 times a day         Glucometer: One Probation officer.       Blood Glucose readings by recall   PREMEAL Breakfast Lunch  Dinner Bedtime  Overall   Glucose range: 160 +      Median:          Dietician visit, most recent: never  Weight history:  Wt Readings from Last 3 Encounters:  03/30/18 153 lb 6.4 oz (69.6 kg)  12/31/17 153 lb (69.4 kg)  10/30/17 153 lb (69.4 kg)    Glycemic control:   Lab Results  Component Value Date   HGBA1C 9.5 (A) 03/30/2018   HGBA1C 7.8 (H) 12/31/2017   HGBA1C 8.2 10/08/2017   Lab Results  Component Value Date   LDLCALC 96 12/17/2016   CREATININE 0.84 12/31/2017   No results found for: MICRALBCREAT  No results found for: FRUCTOSAMINE  Office Visit on 03/30/2018  Component Date Value Ref Range Status  . Hemoglobin A1C 03/30/2018 9.5* 4.0 - 5.6 % Final    Allergies as of 03/30/2018      Reactions   No Known Allergies    Statins       Medication List        Accurate as of 03/30/18 12:09 PM. Always use your most recent med list.          acarbose 25 MG tablet Commonly known  as:  PRECOSE Take with the first bite of food at dinnertime   amLODipine 5 MG tablet Commonly known as:  NORVASC Take 1 tablet (5 mg total) by mouth daily.   aspirin 81 MG tablet Take 81 mg by mouth daily.   blood glucose meter kit and supplies Kit Dispense based on patient and insurance preference. Use up to four times daily as directed. (FOR ICD-9 250.00, 250.01).   glimepiride 4 MG tablet Commonly known as:  AMARYL Take 1 tablet (4 mg total) by mouth daily before breakfast.   glucose blood test strip Check blood sugar once daily as directed   hydrochlorothiazide 25 MG tablet Commonly known as:  HYDRODIURIL Take 1 tablet (25 mg total) by mouth daily.   lisinopril 40 MG tablet Commonly known as:  PRINIVIL,ZESTRIL Take 1 tablet (40 mg total) by mouth daily.   metFORMIN 500 MG 24 hr tablet Commonly known as:  GLUCOPHAGE-XR Take 2 tablets (1,000 mg total) by mouth at bedtime.   ONETOUCH DELICA LANCETS 12A Misc Check blood sugar once daily as directed     pioglitazone 45 MG tablet Commonly known as:  ACTOS Take 1 tablet (45 mg total) by mouth daily.       Allergies:  Allergies  Allergen Reactions  . No Known Allergies   . Statins     Past Medical History:  Diagnosis Date  . Chronic gastritis   . Diabetes type 2, uncontrolled ([place])   . Hyperlipidemia   . Hypertension   . Lumbar herniated disc, L3-4   . Vertigo     Past Surgical History:  Procedure Laterality Date  . BACK SURGERY    . COLONOSCOPY    . ENDARTERECTOMY Left 07/17/2016   Procedure: ENDARTERECTOMY CAROTID;  Surgeon: Elam Dutch, MD;  Location: [facility];  Service: Vascular;  Laterality: Left;  . SPINE SURGERY  02/05/2016    Family History  Problem Relation Age of Onset  . Diabetes Father   . Heart attack Father     Social History:  reports that he quit smoking about 6 years ago. He quit after 60.00 years of use. He has never used smokeless tobacco. He reports that he does not drink alcohol or use drugs.   Review of Systems  Constitutional: Negative for weight loss and reduced appetite.  HENT: Negative for headaches.   Eyes: Negative for blurred vision.  Respiratory: Negative for shortness of breath.   Cardiovascular: Positive for leg swelling.       He may get some swelling of his legs when he is walking more otherwise not  Endocrine: Negative for fatigue.  Genitourinary: Negative for frequency.  Musculoskeletal: Negative for joint pain.  Skin: Negative for rash.  Neurological: Negative for numbness and tingling.  Psychiatric/Behavioral: Negative for insomnia.     Lipid history: Has previously been on statin drugs, is due to follow-up with his PCP this week He was tried on Zetia in 2017 and level low in 2018 and 2019    Lab Results  Component Value Date   CHOL 270 (H) 12/31/2017   HDL 48.70 12/31/2017   LDLCALC 96 12/17/2016   LDLDIRECT 179.0 12/31/2017   TRIG 234.0 (H) 12/31/2017   CHOLHDL 6 12/31/2017           Hypertension: Has  been present  BP Readings from Last 3 Encounters:  03/30/18 (!) 158/88  12/31/17 (!) 160/98  10/20/17 (!) 155/80    Most recent eye exam was in 2018  Most recent  foot exam: 8/19  Currently known complications of diabetes: None  LABS:  Office Visit on 03/30/2018  Component Date Value Ref Range Status  . Hemoglobin A1C 03/30/2018 9.5* 4.0 - 5.6 % Final    Physical Examination:  BP (!) 158/88   Pulse 76   Ht 5' 3" (1.6 m)   Wt 153 lb 6.4 oz (69.6 kg)   SpO2 96%   BMI 27.17 kg/m   GENERAL:         Patient has generalized obesity.    HEENT:         Eye exam shows normal external appearance.  Fundus exam shows no retinopathy.  Oral exam shows normal mucosa .   NECK:   There is no lymphadenopathy  Thyroid is not enlarged and no nodules felt.   Carotids are normal to palpation and no bruit heard  LUNGS:         Chest is symmetrical. Lungs are clear to auscultation.Marland Kitchen   HEART:         Heart sounds:  S1 and S2 are normal. No murmur or click heard., no S3 or S4.   ABDOMEN:   There is no distention present. Liver and spleen are not palpable.  No other mass or tenderness present.    NEUROLOGICAL:   Ankle jerks are absent bilaterally.    Diabetic Foot Exam - Simple   Simple Foot Form Diabetic Foot exam was performed with the following findings:  Yes 03/30/2018 11:54 AM  Visual Inspection No deformities, no ulcerations, no other skin breakdown bilaterally:  Yes Sensation Testing Intact to touch and monofilament testing bilaterally:  Yes Pulse Check Posterior Tibialis and Dorsalis pulse intact bilaterally:  Yes Comments            Vibration sense is moderately reduced in distal first toes.  MUSCULOSKELETAL:  There is no swelling or deformity of the peripheral joints.     EXTREMITIES:     There is trace bilateral ankle edema.  SKIN:       No rash or lesions of concern.        ASSESSMENT:  Diabetes type 2, non-insulin-dependent  See history of present illness for  detailed discussion of current diabetes management, blood sugar patterns and problems identified  His A1c is higher than usual at 9.5  Currently he is taking Amaryl, metformin and pioglitazone with recently worsening blood sugar control He prefers to take only generic medication  His treatment is limited by financial concerns of brand-name medications For this reason his blood sugars are significantly worse with stopping Januvia Also his diet needs to be assessed since he may be getting excessive carbohydrates that his meals causing postprandial hyperglycemia fairly consistently and contributing to his higher A1c now without Januvia Although his BMI is only 27 this is still relatively high for his ethnic status Currently not monitoring blood sugars after meals and does not understand the need to do so or what his blood sugars should be  Complications of diabetes: Minimal neuropathy, needs assessment of urine microalbumin  Hypertension: Not well controlled and to be reviewed by PCP this week  Hyperlipidemia: Currently untreated and not clear if he previously had any side effects from statin drug Discussed that his LDL is at least 70 mg higher than his target Also discussed that he is at high risk for cardiovascular events because of his diabetes, hypertension, hyperlipidemia and family history   PLAN:     Currently his blood sugars are worsening with stopping Janumet  with A1c going up significantly  Since he is having only modest increase in blood sugar in the morning at home he likely has fairly consistent hyperglycemia after meals  For this reason he is blood sugar monitoring needs to be changed to alternating fasting and about 2 hours after eating, discussed blood sugar targets after meals  He will also need to see the dietitian for meal planning to see what modifications can be made with his diet for improved control  Recommended that he also try to do some walking on his own for  exercise especially on the days he is not working  Since he is only on 1000 mg of metformin ER daily and has no history of intolerance to this will need to increase the dose to additional 500 mg at dinnertime for the first week and then a second 500 mg tablet to be added at dinnertime also  Discussed possible GI side effects with metformin  Reportedly has his largest meal at dinnertime and we can give him a trial of acarbose 25 mg to be taken at the start of the meal; this will limit his postprandial hyperglycemia  Discussed again possible GI side effects with this and he can for the first few days try only half a tablet  GLIMEPIRIDE will be changed to half tablet twice daily since he is eating a larger meal in the evening and fasting readings appear to be consistently high  We will need him to follow-up in about 6 weeks to reassess his level of control  May consider patient assistance program if he is not getting improvement in his blood sugars and what try him back on Januvia again  However would consider reducing the dose of Actos since not clear if he is benefiting from this and may tend to get the edema from 45 mg dose  Discussed with patient and family that his A1c of around 8% would be adequate  Patient Instructions  Check blood sugars on waking up  4/7 days  Also check blood sugars about 2 hours after a meal and do this after different meals by rotation  Recommended blood sugar levels on waking up is 90-130 and about 2 hours after meal is 130-160  Please bring your blood sugar monitor to each visit, thank you  Walk on days not working, at least 15 minutes  Medication changes:   1. METFORMIN: Continue 2 tablets in the morning and take additional third tablet at dinnertime.  After 1 week increase evening dose to 2 tablets 2. GLIMEPIRIDE: Change this to a half a tablet with breakfast and half with dinner 3. ACARBOSE: This is a new prescription and you will take it right when  you start eating dinner.  For the first 4 to 5 days take only half a tablet and if not upsetting the stomach go up to the whole tablet    Counseling time on subjects discussed in assessment and plan sections is over 50% of today's 60 minute visit   Consultation note has been sent to the referring physician  Elayne Snare 03/30/2018, 12:09 PM   Note: This office note was prepared with Dragon voice recognition system technology. Any transcriptional errors that result from this process are unintentional.

## 2018-03-30 NOTE — Patient Instructions (Addendum)
Check blood sugars on waking up  4/7 days  Also check blood sugars about 2 hours after a meal and do this after different meals by rotation  Recommended blood sugar levels on waking up is 90-130 and about 2 hours after meal is 130-160  Please bring your blood sugar monitor to each visit, thank you  Walk on days not working, at least 15 minutes  Medication changes:   1. METFORMIN: Continue 2 tablets in the morning and take additional third tablet at dinnertime.  After 1 week increase evening dose to 2 tablets 2. GLIMEPIRIDE: Change this to a half a tablet with breakfast and half with dinner 3. ACARBOSE: This is a new prescription and you will take it right when you start eating dinner.  For the first 4 to 5 days take only half a tablet and if not upsetting the stomach go up to the whole tablet

## 2018-04-03 ENCOUNTER — Encounter: Payer: Self-pay | Admitting: Family Medicine

## 2018-04-03 ENCOUNTER — Ambulatory Visit (INDEPENDENT_AMBULATORY_CARE_PROVIDER_SITE_OTHER): Payer: Medicare Other | Admitting: Family Medicine

## 2018-04-03 VITALS — BP 144/68 | HR 80 | Temp 98.4°F | Ht 63.0 in | Wt 154.4 lb

## 2018-04-03 DIAGNOSIS — E119 Type 2 diabetes mellitus without complications: Secondary | ICD-10-CM

## 2018-04-03 DIAGNOSIS — E1169 Type 2 diabetes mellitus with other specified complication: Secondary | ICD-10-CM | POA: Diagnosis not present

## 2018-04-03 DIAGNOSIS — I10 Essential (primary) hypertension: Secondary | ICD-10-CM

## 2018-04-03 DIAGNOSIS — M791 Myalgia, unspecified site: Secondary | ICD-10-CM

## 2018-04-03 DIAGNOSIS — E785 Hyperlipidemia, unspecified: Secondary | ICD-10-CM

## 2018-04-03 DIAGNOSIS — E1159 Type 2 diabetes mellitus with other circulatory complications: Secondary | ICD-10-CM

## 2018-04-03 DIAGNOSIS — T466X5A Adverse effect of antihyperlipidemic and antiarteriosclerotic drugs, initial encounter: Secondary | ICD-10-CM

## 2018-04-03 DIAGNOSIS — I152 Hypertension secondary to endocrine disorders: Secondary | ICD-10-CM

## 2018-04-03 NOTE — Progress Notes (Signed)
Aaron Blair is a 84 y.o. male is here for follow up.  History of Present Illness:   HPI: Saw Dr. Lucianne MussKumar:  Medication changes:  1. METFORMIN: Continue 2 tablets in the morning and take additional third tablet at dinnertime.  After 1 week increase evening dose to 2 tablets 2. GLIMEPIRIDE: Change this to a half a tablet with breakfast and half with dinner 3. ACARBOSE: This is a new prescription and you will take it right when you start eating dinner.  For the first 4 to 5 days take only half a tablet and if not upsetting the stomach go up to the whole tablet Patient has made all changes in blood sugar. He has not had any issues with the medications. Reviewed instructions from Dr. Lucianne MussKumar and answered all questions.   Statin: He has been tried on them but has "pain all over body". He was concerned due to his lab work.All options reviewed and do to allergy he would like get referral to Lipid clinic if covered by insurance. We will look into that and set up referral covered.    Lab Results  Component Value Date   HGBA1C 9.5 (A) 03/30/2018   There are no preventive care reminders to display for this patient.   Depression screen PHQ 2/9 05/12/2017  Decreased Interest 0  Down, Depressed, Hopeless 0  PHQ - 2 Score 0   PMHx, SurgHx, SocialHx, FamHx, Medications, and Allergies were reviewed in the Visit Navigator and updated as appropriate.   Patient Active Problem List   Diagnosis Date Noted  . Former smoker 03/23/2017  . Diastolic dysfunction 03/02/2017  . Hypertension associated with diabetes (HCC) 12/12/2016  . Hyperlipidemia associated with type 2 diabetes mellitus (HCC) 12/12/2016  . Type 2 diabetes mellitus without complication, without long-term current use of insulin (HCC) 12/12/2016  . Left carotid stenosis 07/17/2016   Social History   Tobacco Use  . Smoking status: Former Smoker    Years: 60.00    Last attempt to quit: 06/28/2011    Years since quitting: 6.7  . Smokeless  tobacco: Never Used  . Tobacco comment: Quit 3 years ago.  Substance Use Topics  . Alcohol use: No    Alcohol/week: 0.0 standard drinks  . Drug use: No   Current Medications and Allergies:   .  acarbose (PRECOSE) 25 MG tablet, Take with the first bite of food at dinnertime, Disp: 30 tablet, Rfl: 1 .  amLODipine (NORVASC) 5 MG tablet, Take 1 tablet (5 mg total) by mouth daily., Disp: 90 tablet, Rfl: 2 .  aspirin 81 MG tablet, Take 81 mg by mouth daily., Disp: , Rfl:  .  glimepiride (AMARYL) 4 MG tablet, Take 1 tablet (4 mg total) by mouth daily before breakfast., Disp: 30 tablet, Rfl: 3 .  hydrochlorothiazide (HYDRODIURIL) 25 MG tablet, Take 1 tablet (25 mg total) by mouth daily., Disp: 90 tablet, Rfl: 3 .  lisinopril (PRINIVIL,ZESTRIL) 40 MG tablet, Take 1 tablet (40 mg total) by mouth daily., Disp: 90 tablet, Rfl: 3 .  metFORMIN (GLUCOPHAGE-XR) 500 MG 24 hr tablet, Take 2 tablets (1,000 mg total) by mouth at bedtime., Disp: 180 tablet, Rfl: 3 .  pioglitazone (ACTOS) 45 MG tablet, Take 1 tablet (45 mg total) by mouth daily., Disp: 90 tablet, Rfl: 3   Allergies  Allergen Reactions  . No Known Allergies   . Statins    Review of Systems   Pertinent items are noted in the HPI. Otherwise, ROS is negative.  Vitals:   Vitals:   04/03/18 1055  BP: (!) 144/68  Pulse: 80  Temp: 98.4 F (36.9 C)  TempSrc: Oral  SpO2: 98%  Weight: 154 lb 6.4 oz (70 kg)  Height: 5\' 3"  (1.6 m)     Body mass index is 27.35 kg/m.  Physical Exam:   Physical Exam  Constitutional: He is oriented to person, place, and time. He appears well-developed and well-nourished. No distress.  HENT:  Head: Normocephalic and atraumatic.  Right Ear: External ear normal.  Left Ear: External ear normal.  Nose: Nose normal.  Mouth/Throat: Oropharynx is clear and moist.  Eyes: Pupils are equal, round, and reactive to light. Conjunctivae and EOM are normal.  Neck: Normal range of motion. Neck supple.    Cardiovascular: Normal rate, regular rhythm, normal heart sounds and intact distal pulses.  Pulmonary/Chest: Effort normal and breath sounds normal.  Abdominal: Soft. Bowel sounds are normal.  Musculoskeletal: Normal range of motion.  Neurological: He is alert and oriented to person, place, and time.  Skin: Skin is warm and dry.  Psychiatric: He has a normal mood and affect. His behavior is normal. Judgment and thought content normal.  Nursing note and vitals reviewed.  Results for orders placed or performed in visit on 03/30/18  POCT glycosylated hemoglobin (Hb A1C)  Result Value Ref Range   Hemoglobin A1C 9.5 (A) 4.0 - 5.6 %   HbA1c POC (<> result, manual entry)     HbA1c, POC (prediabetic range)     HbA1c, POC (controlled diabetic range)      Assessment and Plan:   Aaron Blair was seen today for follow-up.  Diagnoses and all orders for this visit:  Type 2 diabetes mellitus without complication, without long-term current use of insulin (HCC) Comments: Now followed by Endocrinology. Difficult to treat due to patient refusal to do an injectable medication and cost issues.  Hypertension associated with diabetes (HCC) Comments: Stable. No medication changes.  Hyperlipidemia associated with type 2 diabetes mellitus (HCC) Comments: No statin due to myalgias.  Myalgia due to statin    . Reviewed expectations re: course of current medical issues. . Discussed self-management of symptoms. . Outlined signs and symptoms indicating need for more acute intervention. . Patient verbalized understanding and all questions were answered. Marland Kitchen Health Maintenance issues including appropriate healthy diet, exercise, and smoking avoidance were discussed with patient. . See orders for this visit as documented in the electronic medical record. . Patient received an After Visit Summary.  CMA served as Neurosurgeon during this visit. History, Physical, and Plan performed by medical provider. The above  documentation has been reviewed and is accurate and complete. Helane Rima, D.O.  Helane Rima, DO [place] 04/04/2018

## 2018-04-04 ENCOUNTER — Encounter: Payer: Self-pay | Admitting: Family Medicine

## 2018-05-01 ENCOUNTER — Encounter: Payer: Medicare Other | Admitting: Dietician

## 2018-05-01 ENCOUNTER — Other Ambulatory Visit: Payer: Self-pay | Admitting: Family Medicine

## 2018-05-01 DIAGNOSIS — E119 Type 2 diabetes mellitus without complications: Secondary | ICD-10-CM

## 2018-05-13 ENCOUNTER — Encounter: Payer: Medicare Other | Admitting: Family Medicine

## 2018-05-13 ENCOUNTER — Ambulatory Visit: Payer: Medicare Other

## 2018-05-13 ENCOUNTER — Other Ambulatory Visit: Payer: Self-pay | Admitting: Endocrinology

## 2018-05-13 DIAGNOSIS — E1165 Type 2 diabetes mellitus with hyperglycemia: Secondary | ICD-10-CM

## 2018-05-14 ENCOUNTER — Other Ambulatory Visit: Payer: Medicare Other

## 2018-05-19 ENCOUNTER — Ambulatory Visit: Payer: Medicare Other | Admitting: Endocrinology

## 2018-07-06 ENCOUNTER — Ambulatory Visit: Payer: Medicare Other | Admitting: Family Medicine

## 2018-07-21 ENCOUNTER — Encounter: Payer: Medicare Other | Admitting: Family Medicine

## 2018-07-21 ENCOUNTER — Other Ambulatory Visit: Payer: Self-pay | Admitting: Endocrinology

## 2018-07-21 NOTE — Progress Notes (Signed)
This encounter was created in error - please disregard.

## 2018-07-27 ENCOUNTER — Other Ambulatory Visit: Payer: Self-pay | Admitting: Family Medicine

## 2018-07-27 MED ORDER — ACARBOSE 25 MG PO TABS: ORAL_TABLET | ORAL | 1 refills | Status: DC

## 2018-07-27 NOTE — Telephone Encounter (Signed)
Copied from CRM [phone]#[id number]. Topic: Quick Communication - Rx Refill/Question >> Jul 27, 2018  1:37 PM Jilda Rocheemaray, Melissa wrote: Medication: acarbose (PRECOSE) 25 MG tablet   Has the patient contacted their pharmacy? No. (Agent: If no, request that the patient contact the pharmacy for the refill.) (Agent: If yes, when and what did the pharmacy advise?)  Preferred Pharmacy (with phone number or street name):   Agent: Please be advised that RX refills may take up to 3 business days. We ask that you follow-up with your pharmacy.

## 2018-07-27 NOTE — Telephone Encounter (Signed)
See note

## 2018-07-27 NOTE — Telephone Encounter (Signed)
Requested medication (s) are due for refill today: yes  Requested medication (s) are on the active medication list: yes  Last refill:  Last refilled by a different provider  Future visit scheduled: yes  Notes to clinic:  Unable to refill per protocol. Last refilled by a different provider.     Requested Prescriptions  Pending Prescriptions Disp Refills   acarbose (PRECOSE) 25 MG tablet 30 tablet 1    Sig: Take with the first bite of food at dinnertime     Endocrinology:  Diabetes - Alpha Glucosidase Inhibitors - acarbose Failed - 07/27/2018  2:16 PM      Failed - HBA1C is between 0 and 7.9 and within 180 days    Hemoglobin A1C  Date Value Ref Range Status  03/30/2018 9.5 (A) 4.0 - 5.6 % Final   Hgb A1c MFr Bld  Date Value Ref Range Status  12/31/2017 7.8 (H) 4.6 - 6.5 % Final    Comment:    Glycemic Control Guidelines for People with Diabetes:Non Diabetic:  <6%Goal of Therapy: <7%Additional Action Suggested:  >8%          Failed - AST in normal range and within 90 days    AST  Date Value Ref Range Status  12/31/2017 15 0 - 37 U/L Final         Failed - ALT in normal range and within 90 days    ALT  Date Value Ref Range Status  12/31/2017 14 0 - 53 U/L Final         Passed - Valid encounter within last 6 months    Recent Outpatient Visits          3 months ago Type 2 diabetes mellitus without complication, without long-term current use of insulin (HCC)   [facility] [place], [place], DO   6 months ago Essential hypertension   [facility] [place], [place], [place]   9 months ago Essential hypertension   [facility] [place], [place], DO   1 year ago Essential hypertension   [facility] [place], [place], DO   1 year ago Essential hypertension   [facility] [place], [place], [place]      Future Appointments            In 3 weeks Helane RimaWallace, Erica, DO   [facility] [place]

## 2018-07-27 NOTE — Telephone Encounter (Signed)
Preferred pharmacy [facility] [address]. See CRM [phone]

## 2018-08-10 ENCOUNTER — Encounter: Payer: Medicare Other | Admitting: Family Medicine

## 2018-08-19 ENCOUNTER — Encounter: Payer: Self-pay | Admitting: Family Medicine

## 2018-08-19 ENCOUNTER — Ambulatory Visit (INDEPENDENT_AMBULATORY_CARE_PROVIDER_SITE_OTHER): Payer: Medicare Other | Admitting: Family Medicine

## 2018-08-19 VITALS — BP 200/90 | HR 79 | Temp 98.5°F | Ht 63.0 in | Wt 155.8 lb

## 2018-08-19 DIAGNOSIS — Z79899 Other long term (current) drug therapy: Secondary | ICD-10-CM

## 2018-08-19 DIAGNOSIS — E1169 Type 2 diabetes mellitus with other specified complication: Secondary | ICD-10-CM

## 2018-08-19 DIAGNOSIS — E1165 Type 2 diabetes mellitus with hyperglycemia: Secondary | ICD-10-CM | POA: Diagnosis not present

## 2018-08-19 DIAGNOSIS — E785 Hyperlipidemia, unspecified: Secondary | ICD-10-CM

## 2018-08-19 DIAGNOSIS — E1159 Type 2 diabetes mellitus with other circulatory complications: Secondary | ICD-10-CM

## 2018-08-19 DIAGNOSIS — I10 Essential (primary) hypertension: Secondary | ICD-10-CM | POA: Diagnosis not present

## 2018-08-19 DIAGNOSIS — R600 Localized edema: Secondary | ICD-10-CM

## 2018-08-19 DIAGNOSIS — E559 Vitamin D deficiency, unspecified: Secondary | ICD-10-CM | POA: Diagnosis not present

## 2018-08-19 LAB — CBC WITH DIFFERENTIAL/PLATELET
Basophils Absolute: 0 10*3/uL (ref 0.0–0.1)
Basophils Relative: 1 % (ref 0.0–3.0)
Eosinophils Absolute: 0.2 10*3/uL (ref 0.0–0.7)
Eosinophils Relative: 4.3 % (ref 0.0–5.0)
HCT: 41.1 % (ref 39.0–52.0)
Hemoglobin: 13.9 g/dL (ref 13.0–17.0)
Lymphocytes Relative: 32.3 % (ref 12.0–46.0)
Lymphs Abs: 1.6 10*3/uL (ref 0.7–4.0)
MCHC: 33.9 g/dL (ref 30.0–36.0)
MCV: 95.4 fl (ref 78.0–100.0)
Monocytes Absolute: 0.3 10*3/uL (ref 0.1–1.0)
Monocytes Relative: 6.8 % (ref 3.0–12.0)
Neutro Abs: 2.8 10*3/uL (ref 1.4–7.7)
Neutrophils Relative %: 55.6 % (ref 43.0–77.0)
Platelets: 176 10*3/uL (ref 150.0–400.0)
RBC: 4.31 Mil/uL (ref 4.22–5.81)
RDW: 12.8 % (ref 11.5–15.5)
WBC: 5 10*3/uL (ref 4.0–10.5)

## 2018-08-19 LAB — COMPREHENSIVE METABOLIC PANEL
ALT: 14 U/L (ref 0–53)
AST: 17 U/L (ref 0–37)
Albumin: 3.9 g/dL (ref 3.5–5.2)
Alkaline Phosphatase: 45 U/L (ref 39–117)
BUN: 18 mg/dL (ref 6–23)
CO2: 27 mEq/L (ref 19–32)
Calcium: 9.3 mg/dL (ref 8.4–10.5)
Chloride: 102 mEq/L (ref 96–112)
Creatinine, Ser: 0.82 mg/dL (ref 0.40–1.50)
GFR: 95.45 mL/min (ref >60.00)
Glucose, Bld: 160 mg/dL — ABNORMAL HIGH (ref 70–99)
Potassium: 4.1 mEq/L (ref 3.5–5.1)
Sodium: 136 mEq/L (ref 135–145)
Total Bilirubin: 0.7 mg/dL (ref 0.2–1.2)
Total Protein: 6.8 g/dL (ref 6.0–8.3)

## 2018-08-19 LAB — VITAMIN B12: Vitamin B-12: 475 pg/mL (ref 211–911)

## 2018-08-19 LAB — TSH: TSH: 1.08 u[IU]/mL (ref 0.35–4.50)

## 2018-08-19 LAB — VITAMIN D 25 HYDROXY (VIT D DEFICIENCY, FRACTURES): VITD: 31.09 ng/mL (ref 30.00–100.00)

## 2018-08-19 LAB — HEMOGLOBIN A1C: Hgb A1c MFr Bld: 9.9 % — ABNORMAL HIGH (ref 4.6–6.5)

## 2018-08-19 MED ORDER — MEDICAL COMPRESSION SOCKS MISC: 0 refills | Status: AC

## 2018-08-19 NOTE — Progress Notes (Addendum)
Aaron Blair is a 85 y.o. male is here for follow up.  History of Present Illness:   I,Syrina Wake Designer, industrial/product as a Education administrator for [employer], DO.,have documented all relevant documentation on the behalf of Briscoe Deutscher, DO,as directed by  Briscoe Deutscher, DO while in the presence of Briscoe Deutscher, DO.  HPI:   Review: taking medications as instructed, no medication side effects noted, no TIAs, no chest pain on exertion, no dyspnea on exertion, with some ankle edema at the end of the day. Smoker: No.   BP Readings from Last 3 Encounters:  08/19/18 (!) 200/90  04/03/18 (!) 144/68  03/30/18 (!) 158/88   Lab Results  Component Value Date   CREATININE 0.82 08/19/2018   CREATININE 0.84 12/31/2017   CREATININE 0.83 05/12/2017     Evaluation by Dr. Dwyane Dee last year. Recommendations reviewed. Medication options limited to patient wanted generic medications only and refusing insulin. Slight language barrier, [place]. Interpretor usually with him.   Depression screen [facility] 2/9 08/19/2018 05/12/2017  Decreased Interest 0 0  Down, Depressed, Hopeless 0 0  PHQ - 2 Score 0 0  Altered sleeping 0 -  Tired, decreased energy 0 -  Change in appetite 0 -  Feeling bad or failure about yourself  0 -  Trouble concentrating 0 -  Moving slowly or fidgety/restless 0 -  Suicidal thoughts 0 -  PHQ-9 Score 0 -  Difficult doing work/chores Not difficult at all -   PMHx, SurgHx, SocialHx, FamHx, Medications, and Allergies were reviewed in the Visit Navigator and updated as appropriate.   Patient Active Problem List   Diagnosis Date Noted  . Bilateral lower extremity edema 08/23/2018  . Vitamin D deficiency 08/23/2018  . Former smoker 03/23/2017  . Diastolic dysfunction 28/78/6767  . Hypertension associated with diabetes ([place]) 12/12/2016  . Hyperlipidemia associated with type 2 diabetes mellitus ([place]) 12/12/2016  . Type 2 diabetes mellitus without complication, without long-term current use of insulin ([place])  12/12/2016  . Left carotid stenosis 07/17/2016   Social History   Tobacco Use  . Smoking status: Former Smoker    Years: 60.00    Last attempt to quit: 06/28/2011    Years since quitting: 7.1  . Smokeless tobacco: Never Used  . Tobacco comment: Quit 3 years ago.  Substance Use Topics  . Alcohol use: No    Alcohol/week: 0.0 standard drinks  . Drug use: No   Current Medications and Allergies:   .  acarbose (PRECOSE) 25 MG tablet, Take with the first bite of food at dinnertime, Disp: 30 tablet, Rfl: 1 .  amLODipine (NORVASC) 5 MG tablet, Take 1 tablet (5 mg total) by mouth daily., Disp: 90 tablet, Rfl: 2 .  aspirin 81 MG tablet, Take 81 mg by mouth daily., Disp: , Rfl:  .  glimepiride (AMARYL) 4 MG tablet, TAKE 1 TABLET BY MOUTH BEFORE BREAKFAST, Disp: 90 tablet, Rfl: 1 .  hydrochlorothiazide (HYDRODIURIL) 25 MG tablet, Take 1 tablet (25 mg total) by mouth daily., Disp: 90 tablet, Rfl: 3 .  lisinopril (PRINIVIL,ZESTRIL) 40 MG tablet, Take 1 tablet (40 mg total) by mouth daily., Disp: 90 tablet, Rfl: 3 .  metFORMIN (GLUCOPHAGE-XR) 500 MG 24 hr tablet, Take 2 tablets (1,000 mg total) by mouth at bedtime., Disp: 180 tablet, Rfl: 3 .  pioglitazone (ACTOS) 45 MG tablet, Take 1 tablet (45 mg total) by mouth daily., Disp: 90 tablet, Rfl: 3 .  Elastic Bandages & Supports (MEDICAL COMPRESSION SOCKS) MISC, Used to help  with swelling, Disp: 2 each, Rfl: 0   Allergies  Allergen Reactions  . No Known Allergies   . Statins    Review of Systems   Pertinent items are noted in the HPI. Otherwise, a complete ROS is negative.  Vitals:   Vitals:   08/19/18 1104 08/19/18 1105  BP: (!) 180/90 (!) 200/90  Pulse: 79   Temp: 98.5 F (36.9 C)   TempSrc: Oral   SpO2: 96%   Weight: 155 lb 12.8 oz (70.7 kg)   Height: 5' 3" (1.6 m)      Body mass index is 27.6 kg/m.  Physical Exam:   Physical Exam Vitals signs and nursing note reviewed.  Constitutional:      General: He is not in acute  distress.    Appearance: He is well-developed.  HENT:     Head: Normocephalic and atraumatic.     Right Ear: External ear normal.     Left Ear: External ear normal.     Nose: Nose normal.  Eyes:     Conjunctiva/sclera: Conjunctivae normal.     Pupils: Pupils are equal, round, and reactive to light.  Neck:     Musculoskeletal: Neck supple.  Cardiovascular:     Rate and Rhythm: Normal rate and regular rhythm.  Pulmonary:     Effort: Pulmonary effort is normal.  Abdominal:     General: Bowel sounds are normal.     Palpations: Abdomen is soft.  Musculoskeletal: Normal range of motion.  Skin:    General: Skin is warm.  Neurological:     Mental Status: He is alert.  Psychiatric:        Behavior: Behavior normal.    Results for orders placed or performed in visit on 08/19/18  TSH  Result Value Ref Range   TSH 1.08 0.35 - 4.50 uIU/mL  CBC w/Diff  Result Value Ref Range   WBC 5.0 4.0 - 10.5 K/uL   RBC 4.31 4.22 - 5.81 Mil/uL   Hemoglobin 13.9 13.0 - 17.0 g/dL   HCT 41.1 39.0 - 52.0 %   MCV 95.4 78.0 - 100.0 fl   MCHC 33.9 30.0 - 36.0 g/dL   RDW 12.8 11.5 - 15.5 %   Platelets 176.0 150.0 - 400.0 K/uL   Neutrophils Relative % 55.6 43.0 - 77.0 %   Lymphocytes Relative 32.3 12.0 - 46.0 %   Monocytes Relative 6.8 3.0 - 12.0 %   Eosinophils Relative 4.3 0.0 - 5.0 %   Basophils Relative 1.0 0.0 - 3.0 %   Neutro Abs 2.8 1.4 - 7.7 K/uL   Lymphs Abs 1.6 0.7 - 4.0 K/uL   Monocytes Absolute 0.3 0.1 - 1.0 K/uL   Eosinophils Absolute 0.2 0.0 - 0.7 K/uL   Basophils Absolute 0.0 0.0 - 0.1 K/uL  Comp Met (CMET)  Result Value Ref Range   Sodium 136 135 - 145 mEq/L   Potassium 4.1 3.5 - 5.1 mEq/L   Chloride 102 96 - 112 mEq/L   CO2 27 19 - 32 mEq/L   Glucose, Bld 160 (H) 70 - 99 mg/dL   BUN 18 6 - 23 mg/dL   Creatinine, Ser 0.82 0.40 - 1.50 mg/dL   Total Bilirubin 0.7 0.2 - 1.2 mg/dL   Alkaline Phosphatase 45 39 - 117 U/L   AST 17 0 - 37 U/L   ALT 14 0 - 53 U/L   Total Protein  6.8 6.0 - 8.3 g/dL   Albumin 3.9 3.5 - 5.2 g/dL     Calcium 9.3 8.4 - 10.5 mg/dL   GFR 95.45 >60.00 mL/min  Vitamin D (25 hydroxy)  Result Value Ref Range   VITD 31.09 30.00 - 100.00 ng/mL  Vitamin B12  Result Value Ref Range   Vitamin B-12 475 211 - 911 pg/mL  HgB A1c  Result Value Ref Range   Hgb A1c MFr Bld 9.9 (H) 4.6 - 6.5 %    Assessment and Plan:   Alam was seen today for annual exam.  Diagnoses and all orders for this visit:  Hypertension associated with diabetes ([place]) Comments: Not at goal. Will increase Norvasc today. He will keep BP log at home and monitor diet.  Orders: -     TSH -     Elastic Bandages & Supports (MEDICAL COMPRESSION SOCKS) MISC; Used to help with swelling  Hyperlipidemia associated with type 2 diabetes mellitus (HCC) Comments: Hx of statin intolerance due to myalgias. Unable to afford Livalo, given at previous Cardiology visit. Needs statin as high risk. Will see if Cardiology can help.  Orders: -     Comp Met (CMET)  Type 2 diabetes mellitus with hyperglycemia, without long-term current use of insulin (HCC) Comments: Worsening. Will ask him to make f/u with Dr. Dwyane Dee. Will see if DM Educator can meet with him at [facility].  Orders: -     HgB A1c  Bilateral lower extremity edema Comments: He will trial compression hose.  Orders: -     TSH -     Elastic Bandages & Supports (MEDICAL COMPRESSION SOCKS) MISC; Used to help with swelling  Medication management -     CBC w/Diff -     Vitamin B12  Vitamin D deficiency -     Vitamin D (25 hydroxy)    . Orders and follow up as documented in [place], reviewed diet, exercise and weight control, cardiovascular risk and specific lipid/LDL goals reviewed, reviewed medications and side effects in detail.  . Reviewed expectations re: course of current medical issues. . Outlined signs and symptoms indicating need for more acute intervention. . Patient verbalized understanding and all questions were  answered. . Patient received an After Visit Summary. . We are recommending compressing socks to help with edema.  CMA served as Education administrator during this visit. History, Physical, and Plan performed by medical provider. The above documentation has been reviewed and is accurate and complete. Briscoe Deutscher, D.O.  Briscoe Deutscher, DO [place], [facility] 08/23/2018

## 2018-08-23 ENCOUNTER — Encounter: Payer: Self-pay | Admitting: Family Medicine

## 2018-08-23 DIAGNOSIS — E559 Vitamin D deficiency, unspecified: Secondary | ICD-10-CM | POA: Insufficient documentation

## 2018-08-23 DIAGNOSIS — R600 Localized edema: Secondary | ICD-10-CM | POA: Insufficient documentation

## 2018-09-05 IMAGING — DX DG CHEST 2 VIEW
1 series · 1 of 1 positions shown · non-contrast
Comparison: None.

CLINICAL DATA: 82-year-old male with cough and fever.

EXAM:
CHEST  2 VIEW

[chest ap strecther]
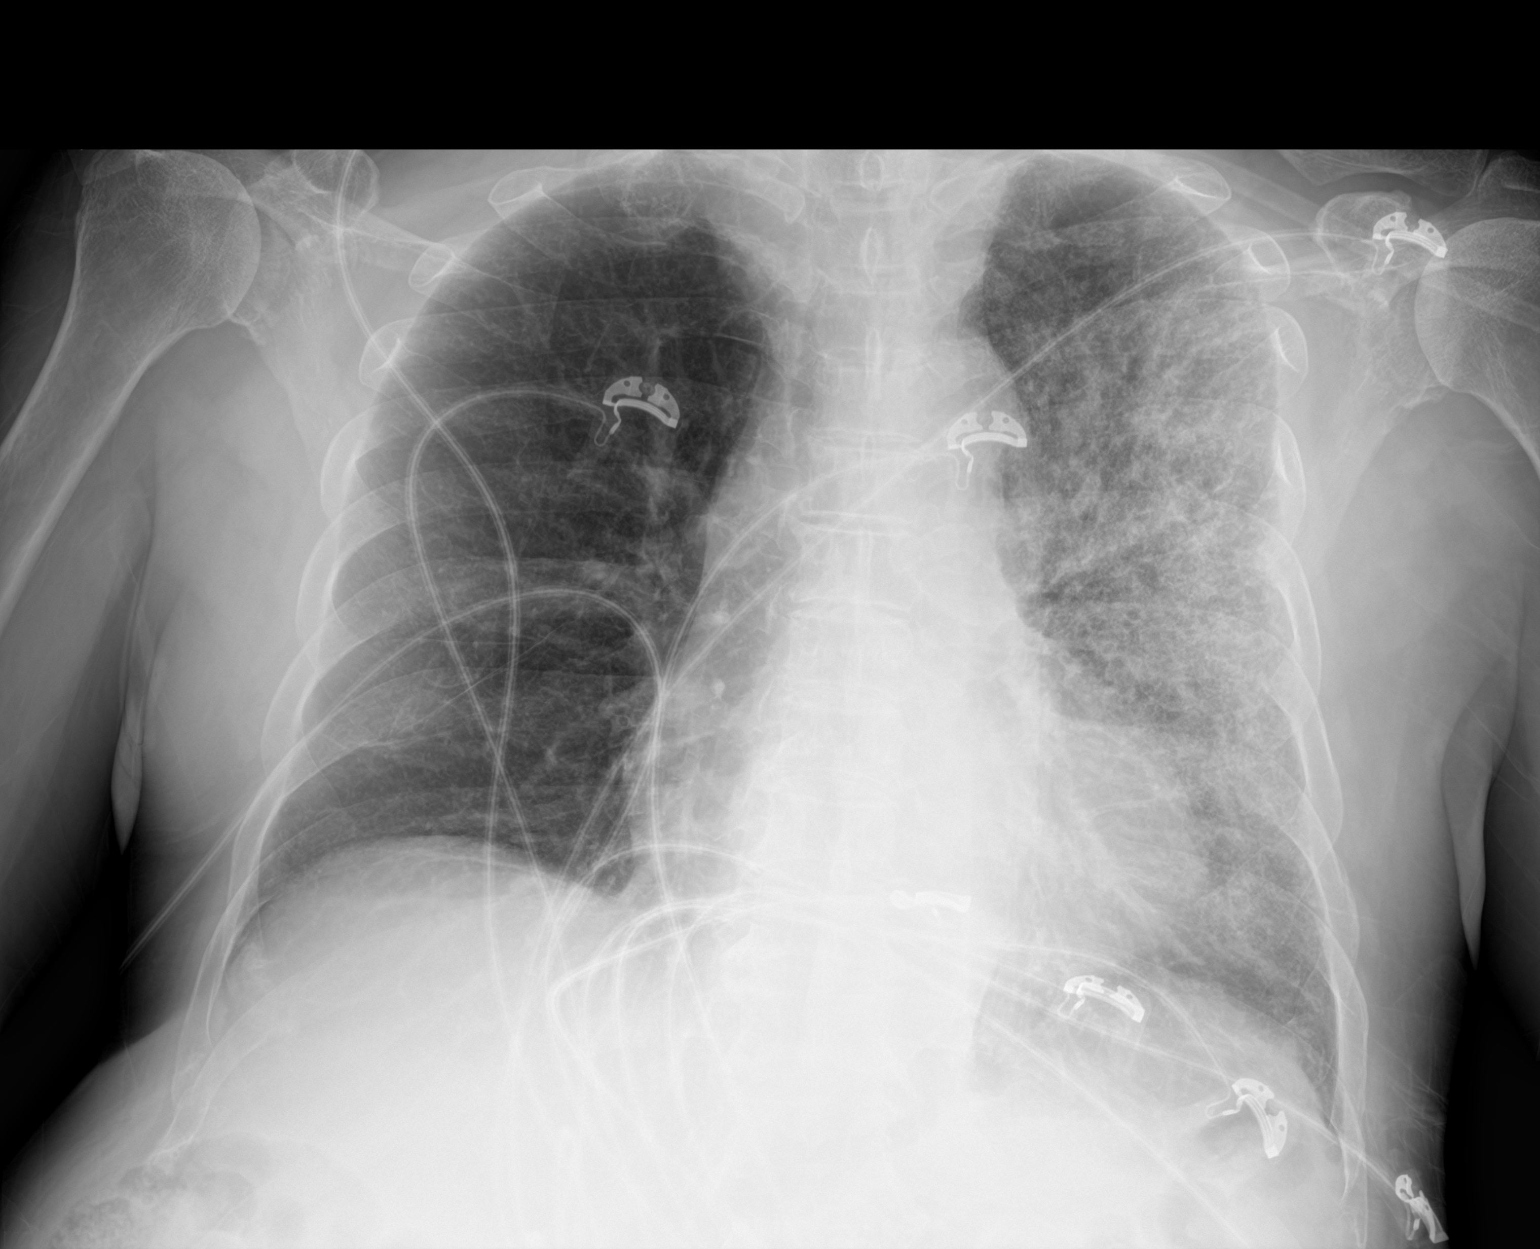

[1 of 1 positions shown; findings below may reference images not displayed]

FINDINGS: Cardiomegaly noted.

Diffuse left lung airspace disease likely represents pneumonia.

The right lung is clear.

There is no evidence of pleural effusion or pneumothorax.

No acute bony abnormalities are identified.
IMPRESSION: Diffuse left lung airspace disease likely representing pneumonia.

Cardiomegaly.

## 2018-11-02 ENCOUNTER — Other Ambulatory Visit: Payer: Self-pay | Admitting: Family Medicine

## 2018-11-02 DIAGNOSIS — E119 Type 2 diabetes mellitus without complications: Secondary | ICD-10-CM

## 2018-11-04 ENCOUNTER — Other Ambulatory Visit: Payer: Self-pay | Admitting: Family Medicine

## 2018-11-04 NOTE — Telephone Encounter (Signed)
Last OV 08/19/2018 Last refill 07/27/2018 #30/1 Next OV 11/18/2018

## 2018-11-11 ENCOUNTER — Telehealth: Payer: Self-pay | Admitting: *Deleted

## 2018-11-11 NOTE — Telephone Encounter (Signed)
Spoke to pt told him calling about appointments for his wife and him next week with Dr. Earlene Plater to see if we can do virtual visits. Pt said he does not understand will have his son call me back. Told him okay.

## 2018-11-17 ENCOUNTER — Telehealth: Payer: Self-pay

## 2018-11-17 NOTE — Telephone Encounter (Signed)
Called and left detailed voicemail message on patient's cell phone in regard to his and his wife's appts tomorrow 4/8 w/Dr. Earlene Plater.  Need to change to [email or web address] web appts.  Requested a call back from patient.  Also attempted to call son, Bronco Stem but voice mailbox has not been set up yet so I was unable to leave a message.

## 2018-11-18 ENCOUNTER — Other Ambulatory Visit: Payer: Self-pay

## 2018-11-18 ENCOUNTER — Encounter: Payer: Self-pay | Admitting: Family Medicine

## 2018-11-18 ENCOUNTER — Ambulatory Visit (INDEPENDENT_AMBULATORY_CARE_PROVIDER_SITE_OTHER): Payer: Medicare Other | Admitting: Family Medicine

## 2018-11-18 VITALS — BP 150/89 | Ht 63.0 in | Wt 155.0 lb

## 2018-11-18 DIAGNOSIS — E1159 Type 2 diabetes mellitus with other circulatory complications: Secondary | ICD-10-CM

## 2018-11-18 DIAGNOSIS — I152 Hypertension secondary to endocrine disorders: Secondary | ICD-10-CM

## 2018-11-18 DIAGNOSIS — R600 Localized edema: Secondary | ICD-10-CM | POA: Diagnosis not present

## 2018-11-18 DIAGNOSIS — I10 Essential (primary) hypertension: Secondary | ICD-10-CM | POA: Diagnosis not present

## 2018-11-18 DIAGNOSIS — E1169 Type 2 diabetes mellitus with other specified complication: Secondary | ICD-10-CM | POA: Diagnosis not present

## 2018-11-18 DIAGNOSIS — E119 Type 2 diabetes mellitus without complications: Secondary | ICD-10-CM

## 2018-11-18 DIAGNOSIS — E785 Hyperlipidemia, unspecified: Secondary | ICD-10-CM

## 2018-11-18 MED ORDER — LISINOPRIL 20 MG PO TABS: 20.0000 mg | ORAL_TABLET | Freq: Every day | ORAL | 1 refills | Status: DC

## 2018-11-18 NOTE — Progress Notes (Signed)
Virtual Visit via Video   I connected with Aaron Blair on 11/18/18 at 10:40 AM EDT by a video enabled telemedicine application and verified that I am speaking with the correct person using two identifiers. Location patient: Home Location provider: [employer], Office Persons participating in the virtual visit: Aaron Blair, Helane Rima, DO [name], CMA acting as scribe for Dr. Helane Rima.   I discussed the limitations of evaluation and management by telemedicine and the availability of in person appointments. The patient expressed understanding and agreed to proceed.  Subjective:   HPI: son was with him at this appointment. He is concerned that father has had increased issues with memory. He is forgetting conversations and finding locations. He did have distant relative that may have had Alzheimer's in the 1920. Son has noticed increased issues over time. He is still working but has cut down to only one job. His son is working from home now and would like to be able to come in for that. Father had another car accident and is no longer driving.    HTN: We had patient on Lisinopril 40. But patient is taking 20mg  daily. His Blood pressures are averaging 150/89. We sill stay at the 20mg .    Blood sugars are running 166-168 fasting we will keep medications as they are.     At last visit we recommended compression stockings. He has not tried that yet. He is still having some swelling in legs in end of day. We stressed how important it is to try to wear them. This will help with swelling.   Reviewed all precautions and expectations with prevention of Covid-19.  ROS: See pertinent positives and negatives per HPI.  Patient Active Problem List   Diagnosis Date Noted  . Bilateral lower extremity edema 08/23/2018  . Vitamin D deficiency 08/23/2018  . Former smoker 03/23/2017  . Diastolic dysfunction 03/02/2017  . Hypertension associated with diabetes (HCC) 12/12/2016  .  Hyperlipidemia associated with type 2 diabetes mellitus (HCC) 12/12/2016  . Type 2 diabetes mellitus without complication, without long-term current use of insulin (HCC) 12/12/2016  . Left carotid stenosis 07/17/2016    Social History   Tobacco Use  . Smoking status: Former Smoker    Years: 60.00    Last attempt to quit: 06/28/2011    Years since quitting: 7.3  . Smokeless tobacco: Never Used  . Tobacco comment: Quit 3 years ago.  Substance Use Topics  . Alcohol use: No    Alcohol/week: 0.0 standard drinks    Current Outpatient Medications:  .  acarbose (PRECOSE) 25 MG tablet, TAKE BY MOUTH WITH THE FIRST BITE OF FOOD AT DINNERTIME, Disp: 30 tablet, Rfl: 0 .  amLODipine (NORVASC) 5 MG tablet, Take 1 tablet (5 mg total) by mouth daily., Disp: 90 tablet, Rfl: 2 .  aspirin 81 MG tablet, Take 81 mg by mouth daily., Disp: , Rfl:  .  Elastic Bandages & Supports (MEDICAL COMPRESSION SOCKS) MISC, Used to help with swelling, Disp: 2 each, Rfl: 0 .  glimepiride (AMARYL) 4 MG tablet, TAKE 1 TABLET BY MOUTH BEFORE BREAKFAST, Disp: 90 tablet, Rfl: 0 .  hydrochlorothiazide (HYDRODIURIL) 25 MG tablet, Take 1 tablet (25 mg total) by mouth daily., Disp: 90 tablet, Rfl: 3 .  lisinopril (PRINIVIL,ZESTRIL) 20 MG tablet, Take 1 tablet (20 mg total) by mouth daily., Disp: 90 tablet, Rfl: 1 .  metFORMIN (GLUCOPHAGE-XR) 500 MG 24 hr tablet, Take 2 tablets (1,000 mg total) by mouth at  bedtime., Disp: 180 tablet, Rfl: 3 .  pioglitazone (ACTOS) 45 MG tablet, Take 1 tablet (45 mg total) by mouth daily., Disp: 90 tablet, Rfl: 3  Allergies  Allergen Reactions  . No Known Allergies   . Statins     Objective:   VITALS: Per patient if applicable, see vitals. GENERAL: Alert,  NECK: Normal movements of the head and neck. CARDIOPULMONARY: No increased WOB.  PSYCH: Pleasant and cooperative, well-groomed. Speech normal rate and rhythm.   Assessment and Plan:   Tyreese was seen today for follow-up.  Diagnoses  and all orders for this visit:  Bilateral lower extremity edema  Hypertension associated with diabetes (HCC) -     lisinopril (PRINIVIL,ZESTRIL) 20 MG tablet; Take 1 tablet (20 mg total) by mouth daily.  Hyperlipidemia associated with type 2 diabetes mellitus (HCC)  Type 2 diabetes mellitus without complication, without long-term current use of insulin (HCC)  Well controlled.  No signs of complications, medication side effects, or red flags.  Continue current regimen.    . Reviewed expectations re: course of current medical issues. . Discussed self-management of symptoms. . Outlined signs and symptoms indicating need for more acute intervention. . Patient verbalized understanding and all questions were answered. Marland Kitchen. Health Maintenance issues including appropriate healthy diet, exercise, and smoking avoidance were discussed with patient. . See orders for this visit as documented in the electronic medical record.  Helane RimaErica Babygirl Trager, DO 11/18/2018

## 2018-12-28 ENCOUNTER — Other Ambulatory Visit: Payer: Self-pay | Admitting: Family Medicine

## 2019-01-05 ENCOUNTER — Other Ambulatory Visit: Payer: Self-pay | Admitting: Family Medicine

## 2019-01-05 DIAGNOSIS — E119 Type 2 diabetes mellitus without complications: Secondary | ICD-10-CM

## 2019-01-16 ENCOUNTER — Other Ambulatory Visit: Payer: Self-pay | Admitting: Family Medicine

## 2019-01-16 DIAGNOSIS — I10 Essential (primary) hypertension: Secondary | ICD-10-CM

## 2019-01-27 ENCOUNTER — Other Ambulatory Visit: Payer: Self-pay | Admitting: Family Medicine

## 2019-01-27 DIAGNOSIS — E119 Type 2 diabetes mellitus without complications: Secondary | ICD-10-CM

## 2019-02-08 ENCOUNTER — Other Ambulatory Visit: Payer: Self-pay | Admitting: Family Medicine

## 2019-02-08 DIAGNOSIS — I10 Essential (primary) hypertension: Secondary | ICD-10-CM

## 2019-02-26 ENCOUNTER — Ambulatory Visit: Payer: Medicare Other | Admitting: Family Medicine

## 2019-03-03 ENCOUNTER — Other Ambulatory Visit: Payer: Self-pay | Admitting: Family Medicine

## 2019-03-03 DIAGNOSIS — E119 Type 2 diabetes mellitus without complications: Secondary | ICD-10-CM

## 2019-04-13 ENCOUNTER — Other Ambulatory Visit: Payer: Self-pay | Admitting: Family Medicine

## 2019-04-16 ENCOUNTER — Other Ambulatory Visit: Payer: Self-pay | Admitting: Family Medicine

## 2019-04-16 DIAGNOSIS — E119 Type 2 diabetes mellitus without complications: Secondary | ICD-10-CM

## 2019-04-20 NOTE — Telephone Encounter (Signed)
Last fill 05/27/*20  #180/0 Last OV 11/18/18

## 2019-05-03 ENCOUNTER — Other Ambulatory Visit: Payer: Self-pay | Admitting: Family Medicine

## 2019-05-03 DIAGNOSIS — E119 Type 2 diabetes mellitus without complications: Secondary | ICD-10-CM

## 2019-05-12 ENCOUNTER — Other Ambulatory Visit: Payer: Self-pay

## 2019-05-12 ENCOUNTER — Ambulatory Visit: Payer: Medicare Other

## 2019-05-17 ENCOUNTER — Other Ambulatory Visit: Payer: Self-pay | Admitting: Family Medicine

## 2019-05-17 DIAGNOSIS — E1159 Type 2 diabetes mellitus with other circulatory complications: Secondary | ICD-10-CM

## 2019-05-17 DIAGNOSIS — I10 Essential (primary) hypertension: Secondary | ICD-10-CM

## 2019-05-18 NOTE — Telephone Encounter (Signed)
Last fill 02/10/19  #90/0 Last OV 11/18/18 Next OV 06/01/19

## 2019-06-01 ENCOUNTER — Other Ambulatory Visit: Payer: Self-pay

## 2019-06-01 ENCOUNTER — Encounter: Payer: Self-pay | Admitting: Family Medicine

## 2019-06-01 ENCOUNTER — Ambulatory Visit (INDEPENDENT_AMBULATORY_CARE_PROVIDER_SITE_OTHER): Payer: Medicare Other | Admitting: Family Medicine

## 2019-06-01 VITALS — BP 155/82 | HR 82 | Temp 97.3°F | Ht 63.0 in | Wt 155.0 lb

## 2019-06-01 DIAGNOSIS — E1159 Type 2 diabetes mellitus with other circulatory complications: Secondary | ICD-10-CM

## 2019-06-01 DIAGNOSIS — R600 Localized edema: Secondary | ICD-10-CM | POA: Diagnosis not present

## 2019-06-01 DIAGNOSIS — Z87891 Personal history of nicotine dependence: Secondary | ICD-10-CM

## 2019-06-01 DIAGNOSIS — E785 Hyperlipidemia, unspecified: Secondary | ICD-10-CM

## 2019-06-01 DIAGNOSIS — E119 Type 2 diabetes mellitus without complications: Secondary | ICD-10-CM

## 2019-06-01 DIAGNOSIS — E559 Vitamin D deficiency, unspecified: Secondary | ICD-10-CM

## 2019-06-01 DIAGNOSIS — E538 Deficiency of other specified B group vitamins: Secondary | ICD-10-CM | POA: Diagnosis not present

## 2019-06-01 DIAGNOSIS — I152 Hypertension secondary to endocrine disorders: Secondary | ICD-10-CM

## 2019-06-01 DIAGNOSIS — E1169 Type 2 diabetes mellitus with other specified complication: Secondary | ICD-10-CM | POA: Diagnosis not present

## 2019-06-01 DIAGNOSIS — E1165 Type 2 diabetes mellitus with hyperglycemia: Secondary | ICD-10-CM

## 2019-06-01 DIAGNOSIS — R300 Dysuria: Secondary | ICD-10-CM

## 2019-06-01 DIAGNOSIS — I10 Essential (primary) hypertension: Secondary | ICD-10-CM

## 2019-06-01 LAB — COMPREHENSIVE METABOLIC PANEL
ALT: 15 U/L (ref 0–53)
AST: 17 U/L (ref 0–37)
Albumin: 4 g/dL (ref 3.5–5.2)
Alkaline Phosphatase: 49 U/L (ref 39–117)
BUN: 17 mg/dL (ref 6–23)
CO2: 29 mEq/L (ref 19–32)
Calcium: 9.7 mg/dL (ref 8.4–10.5)
Chloride: 98 mEq/L (ref 96–112)
Creatinine, Ser: 0.87 mg/dL (ref 0.40–1.50)
GFR: 83.71 mL/min (ref >60.00)
Glucose, Bld: 163 mg/dL — ABNORMAL HIGH (ref 70–99)
Potassium: 3.9 mEq/L (ref 3.5–5.1)
Sodium: 135 mEq/L (ref 135–145)
Total Bilirubin: 0.9 mg/dL (ref 0.2–1.2)
Total Protein: 6.9 g/dL (ref 6.0–8.3)

## 2019-06-01 LAB — LIPID PANEL
Cholesterol: 263 mg/dL — ABNORMAL HIGH (ref 0–200)
HDL: 52.6 mg/dL (ref >39.00)
NonHDL: 210.27
Total CHOL/HDL Ratio: 5
Triglycerides: 287 mg/dL — ABNORMAL HIGH (ref 0.0–149.0)
VLDL: 57.4 mg/dL — ABNORMAL HIGH (ref 0.0–40.0)

## 2019-06-01 LAB — VITAMIN B12: Vitamin B-12: 384 pg/mL (ref 211–911)

## 2019-06-01 LAB — URINALYSIS, ROUTINE W REFLEX MICROSCOPIC
Bilirubin Urine: NEGATIVE
Hgb urine dipstick: NEGATIVE
Ketones, ur: NEGATIVE
Leukocytes,Ua: NEGATIVE
Nitrite: NEGATIVE
RBC / HPF: NONE SEEN (ref 0–?)
Specific Gravity, Urine: 1.02 (ref 1.000–1.030)
Total Protein, Urine: 100 — AB
Urine Glucose: 250 — AB
Urobilinogen, UA: 0.2 (ref 0.0–1.0)
WBC, UA: NONE SEEN (ref 0–?)
pH: 6.5 (ref 5.0–8.0)

## 2019-06-01 LAB — MAGNESIUM: Magnesium: 1.9 mg/dL (ref 1.5–2.5)

## 2019-06-01 LAB — CBC WITH DIFFERENTIAL/PLATELET
Basophils Absolute: 0 10*3/uL (ref 0.0–0.1)
Basophils Relative: 0.9 % (ref 0.0–3.0)
Eosinophils Absolute: 0.2 10*3/uL (ref 0.0–0.7)
Eosinophils Relative: 4.2 % (ref 0.0–5.0)
HCT: 41.1 % (ref 39.0–52.0)
Hemoglobin: 14.1 g/dL (ref 13.0–17.0)
Lymphocytes Relative: 30.4 % (ref 12.0–46.0)
Lymphs Abs: 1.5 10*3/uL (ref 0.7–4.0)
MCHC: 34.2 g/dL (ref 30.0–36.0)
MCV: 95.7 fl (ref 78.0–100.0)
Monocytes Absolute: 0.3 10*3/uL (ref 0.1–1.0)
Monocytes Relative: 6.2 % (ref 3.0–12.0)
Neutro Abs: 2.8 10*3/uL (ref 1.4–7.7)
Neutrophils Relative %: 58.3 % (ref 43.0–77.0)
Platelets: 161 10*3/uL (ref 150.0–400.0)
RBC: 4.3 Mil/uL (ref 4.22–5.81)
RDW: 12.8 % (ref 11.5–15.5)
WBC: 4.9 10*3/uL (ref 4.0–10.5)

## 2019-06-01 LAB — HEMOGLOBIN A1C: Hgb A1c MFr Bld: 9.8 % — ABNORMAL HIGH (ref 4.6–6.5)

## 2019-06-01 LAB — VITAMIN D 25 HYDROXY (VIT D DEFICIENCY, FRACTURES): VITD: 31.33 ng/mL (ref 30.00–100.00)

## 2019-06-01 LAB — MICROALBUMIN / CREATININE URINE RATIO
Creatinine,U: 66.1 mg/dL
Microalb Creat Ratio: 115.9 mg/g — ABNORMAL HIGH (ref 0.0–30.0)
Microalb, Ur: 76.6 mg/dL — ABNORMAL HIGH (ref 0.0–1.9)

## 2019-06-01 LAB — LDL CHOLESTEROL, DIRECT: Direct LDL: 157 mg/dL

## 2019-06-01 MED ORDER — ONETOUCH DELICA PLUS LANCET33G MISC: 1.0000 | Freq: Three times a day (TID) | 0 refills | Status: AC

## 2019-06-01 MED ORDER — ACARBOSE 25 MG PO TABS: 25.0000 mg | ORAL_TABLET | Freq: Three times a day (TID) | ORAL | 4 refills | Status: AC

## 2019-06-01 MED ORDER — HYDROCHLOROTHIAZIDE 25 MG PO TABS: 25.0000 mg | ORAL_TABLET | Freq: Every day | ORAL | 1 refills | Status: AC

## 2019-06-01 MED ORDER — ONETOUCH VERIO VI STRP: 1.0000 | ORAL_STRIP | Freq: Three times a day (TID) | 4 refills | Status: AC

## 2019-06-01 MED ORDER — PIOGLITAZONE HCL 45 MG PO TABS: 45.0000 mg | ORAL_TABLET | Freq: Every day | ORAL | 1 refills | Status: AC

## 2019-06-01 MED ORDER — LISINOPRIL 20 MG PO TABS: 20.0000 mg | ORAL_TABLET | Freq: Every day | ORAL | 0 refills | Status: AC

## 2019-06-01 MED ORDER — METFORMIN HCL ER 500 MG PO TB24: 1000.0000 mg | ORAL_TABLET | Freq: Every day | ORAL | 1 refills | Status: AC

## 2019-06-01 MED ORDER — GLIMEPIRIDE 4 MG PO TABS: 4.0000 mg | ORAL_TABLET | Freq: Every day | ORAL | 3 refills | Status: AC

## 2019-06-01 MED ORDER — AMLODIPINE BESYLATE 5 MG PO TABS: 5.0000 mg | ORAL_TABLET | Freq: Every day | ORAL | 1 refills | Status: AC

## 2019-06-01 NOTE — Progress Notes (Signed)
Aaron Blair is a 85 y.o. male is here for follow up.  History of Present Illness:   Aaron Blair, CMA acting as scribe for Dr. Helane Rima.   HPI:   1. Hypertension associated with diabetes (HCC) Review: taking medications as instructed, no medication side effects noted, no TIAs, no chest pain on exertion, no dyspnea on exertion, no swelling of ankles. Smoker: No.   BP Readings from Last 3 Encounters:  06/01/19 (!) 155/82  11/18/18 (!) 150/89  08/19/18 (!) 200/90   Lab Results  Component Value Date   CREATININE 0.82 08/19/2018   CREATININE 0.84 12/31/2017   CREATININE 0.83 05/12/2017     2. Hyperlipidemia associated with type 2 diabetes mellitus (HCC) Is the patient taking medications without problems? No. Does the patient complain of muscle aches?  No. Trying to exercise on a regular basis? No. Compliant with diet? Yes.  Lab Results  Component Value Date   CHOL 270 (H) 12/31/2017   HDL 48.70 12/31/2017   LDLCALC 96 12/17/2016   LDLDIRECT 179.0 12/31/2017   TRIG 234.0 (H) 12/31/2017   CHOLHDL 6 12/31/2017   Lab Results  Component Value Date   ALT 14 08/19/2018   AST 17 08/19/2018   ALKPHOS 45 08/19/2018   BILITOT 0.7 08/19/2018     3. Type 2 diabetes mellitus with complication, without long-term current use of insulin (HCC) Medication compliance: compliant all of the time, diabetic diet compliance: compliant all of the time, home glucose monitoring: checks daily fasting readings are between 150 - 160 , further diabetic ROS: no polyuria or polydipsia, no chest pain, dyspnea or TIA's, no numbness, tingling or pain in extremities. New complaint of "bubbles in urine."  Health Maintenance Due  Topic Date Due  . OPHTHALMOLOGY EXAM  09/04/2018  . HEMOGLOBIN A1C  02/17/2019  . INFLUENZA VACCINE  03/13/2019  . FOOT EXAM  03/31/2019   Depression screen [facility] 2/9 08/19/2018 05/12/2017  Decreased Interest 0 0  Down, Depressed, Hopeless 0 0  PHQ - 2 Score 0 0  Altered  sleeping 0 -  Tired, decreased energy 0 -  Change in appetite 0 -  Feeling bad or failure about yourself  0 -  Trouble concentrating 0 -  Moving slowly or fidgety/restless 0 -  Suicidal thoughts 0 -  PHQ-9 Score 0 -  Difficult doing work/chores Not difficult at all -   PMHx, SurgHx, SocialHx, FamHx, Medications, and Allergies were reviewed in the Visit Navigator and updated as appropriate.   Patient Active Problem List   Diagnosis Date Noted  . Bilateral lower extremity edema 08/23/2018  . Vitamin D deficiency 08/23/2018  . Former smoker 03/23/2017  . Diastolic dysfunction 03/02/2017  . Hypertension associated with diabetes (HCC) 12/12/2016  . Hyperlipidemia associated with type 2 diabetes mellitus (HCC) 12/12/2016  . Type 2 diabetes mellitus with hyperglycemia (HCC) 12/12/2016  . Left carotid stenosis 07/17/2016   Social History   Tobacco Use  . Smoking status: Former Smoker    Years: 60.00    Quit date: 06/28/2011    Years since quitting: 7.9  . Smokeless tobacco: Never Used  . Tobacco comment: Quit 3 years ago.  Substance Use Topics  . Alcohol use: No    Alcohol/week: 0.0 standard drinks  . Drug use: No   Current Medications and Allergies   Current Outpatient Medications:  .  acarbose (PRECOSE) 25 MG tablet, Take 1 tablet (25 mg total) by mouth 3 (three) times daily with meals., Disp: 30  tablet, Rfl: 4 .  amLODipine (NORVASC) 5 MG tablet, Take 1 tablet (5 mg total) by mouth daily., Disp: 90 tablet, Rfl: 1 .  aspirin 81 MG tablet, Take 81 mg by mouth daily., Disp: , Rfl:  .  Elastic Bandages & Supports (MEDICAL COMPRESSION SOCKS) MISC, Used to help with swelling, Disp: 2 each, Rfl: 0 .  glimepiride (AMARYL) 4 MG tablet, Take 1 tablet (4 mg total) by mouth daily with breakfast., Disp: 90 tablet, Rfl: 3 .  glucose blood (ONETOUCH VERIO) test strip, 1 each by Other route 3 (three) times daily. Use as instructed, Disp: 100 each, Rfl: 4 .  hydrochlorothiazide (HYDRODIURIL)  25 MG tablet, Take 1 tablet (25 mg total) by mouth daily., Disp: 90 tablet, Rfl: 1 .  Lancets (ONETOUCH DELICA PLUS LANCET33G) MISC, 1 each by Other route 3 (three) times daily., Disp: 100 each, Rfl: 0 .  lisinopril (ZESTRIL) 20 MG tablet, Take 1 tablet (20 mg total) by mouth daily., Disp: 90 tablet, Rfl: 0 .  metFORMIN (GLUCOPHAGE-XR) 500 MG 24 hr tablet, Take 2 tablets (1,000 mg total) by mouth at bedtime., Disp: 180 tablet, Rfl: 1 .  pioglitazone (ACTOS) 45 MG tablet, Take 1 tablet (45 mg total) by mouth daily., Disp: 90 tablet, Rfl: 1   Allergies  Allergen Reactions  . No Known Allergies   . Statins    Review of Systems   Pertinent items are noted in the HPI. Otherwise, a complete ROS is negative.  Vitals   Vitals:   06/01/19 0802  BP: (!) 155/82  Pulse: 82  Temp: (!) 97.3 F (36.3 C)  TempSrc: Temporal  SpO2: 94%  Weight: 155 lb (70.3 kg)  Height: 5\' 3"  (1.6 m)     Body mass index is 27.46 kg/m.  Physical Exam   Physical Exam Vitals signs and nursing note reviewed.  Constitutional:      General: He is not in acute distress.    Appearance: He is well-developed.  HENT:     Head: Normocephalic and atraumatic.     Right Ear: External ear normal.     Left Ear: External ear normal.     Nose: Nose normal.  Eyes:     Conjunctiva/sclera: Conjunctivae normal.     Pupils: Pupils are equal, round, and reactive to light.  Neck:     Musculoskeletal: Neck supple.  Cardiovascular:     Rate and Rhythm: Normal rate and regular rhythm.  Pulmonary:     Effort: Pulmonary effort is normal.  Abdominal:     General: Bowel sounds are normal.     Palpations: Abdomen is soft.  Musculoskeletal: Normal range of motion.  Skin:    General: Skin is warm.  Neurological:     Mental Status: He is alert.  Psychiatric:        Behavior: Behavior normal.    Assessment and Plan   Aaron Blair was seen today for follow-up.  Diagnoses and all orders for this visit:  Hypertension associated  with diabetes (HCC) -     Comprehensive metabolic panel -     Magnesium -     amLODipine (NORVASC) 5 MG tablet; Take 1 tablet (5 mg total) by mouth daily. -     hydrochlorothiazide (HYDRODIURIL) 25 MG tablet; Take 1 tablet (25 mg total) by mouth daily. -     lisinopril (ZESTRIL) 20 MG tablet; Take 1 tablet (20 mg total) by mouth daily.  Hyperlipidemia associated with type 2 diabetes mellitus (HCC) -  Lipid panel  Type 2 diabetes mellitus without complication, without long-term current use of insulin (HCC) -     Hemoglobin A1c -     acarbose (PRECOSE) 25 MG tablet; Take 1 tablet (25 mg total) by mouth 3 (three) times daily with meals. -     glimepiride (AMARYL) 4 MG tablet; Take 1 tablet (4 mg total) by mouth daily with breakfast. -     Lancets (ONETOUCH DELICA PLUS ZHGDJM42A) MISC; 1 each by Other route 3 (three) times daily. -     metFORMIN (GLUCOPHAGE-XR) 500 MG 24 hr tablet; Take 2 tablets (1,000 mg total) by mouth at bedtime. -     pioglitazone (ACTOS) 45 MG tablet; Take 1 tablet (45 mg total) by mouth daily. -     glucose blood (ONETOUCH VERIO) test strip; 1 each by Other route 3 (three) times daily. Use as instructed -     Microalbumin / creatinine urine ratio  Former smoker  Bilateral lower extremity edema -     CBC with Differential/Platelet  Vitamin D deficiency -     VITAMIN D 25 Hydroxy (Vit-D Deficiency, Fractures)  B12 deficiency -     Vitamin B12  Type 2 diabetes mellitus with hyperglycemia, without long-term current use of insulin ([place])  Essential hypertension Comments: Reviewed medications and blood pressure log.  Will add Norvasc today.  Dysuria -     Urine Culture -     Urinalysis, Routine w reflex microscopic   . Orders and follow up as documented in [place], reviewed diet, exercise and weight control, cardiovascular risk and specific lipid/LDL goals reviewed, reviewed medications and side effects in detail.  . Reviewed expectations re: course of  current medical issues. . Outlined signs and symptoms indicating need for more acute intervention. . Patient verbalized understanding and all questions were answered. . Patient received an After Visit Summary.  CMA served as Education administrator during this visit. History, Physical, and Plan performed by medical provider. The above documentation has been reviewed and is accurate and complete. Briscoe Deutscher, D.O.  Briscoe Deutscher, DO [place], [facility] 06/01/2019

## 2019-06-02 ENCOUNTER — Encounter: Payer: Self-pay | Admitting: Family Medicine

## 2019-06-02 DIAGNOSIS — R809 Proteinuria, unspecified: Secondary | ICD-10-CM | POA: Insufficient documentation

## 2019-06-02 LAB — UNLABELED: Test Ordered On Req: 395

## 2019-06-03 ENCOUNTER — Telehealth: Payer: Self-pay | Admitting: Family Medicine

## 2019-06-03 NOTE — Telephone Encounter (Signed)
Hey we did not get any samples on him would this be the urine culture?

## 2019-06-03 NOTE — Telephone Encounter (Signed)
Aaron Blair, with [employer], states that a specimen was sent unlabeled without patients name on it. Patient's information was on documentation also sent. She is requesting call back from [place] or lab to discuss.    CB#  [phone]   reference # [id number]

## 2019-06-03 NOTE — Telephone Encounter (Signed)
See note

## 2019-06-04 LAB — URINE CULTURE
MICRO NUMBER:: 1023243
Result:: NO GROWTH
SPECIMEN QUALITY:: ADEQUATE

## 2019-06-04 LAB — PAT ID TIQ DOC: Test Affected: 395

## 2019-06-29 IMAGING — DX DG CHEST 2V
2 series · 2 of 2 positions shown · non-contrast
Comparison: 01/24/2017 and 12/15/2016 radiographs.

CLINICAL DATA: Productive cough for 4 days. Chest tightness. No
shortness of breath.

EXAM:
CHEST  2 VIEW

[chest pa]
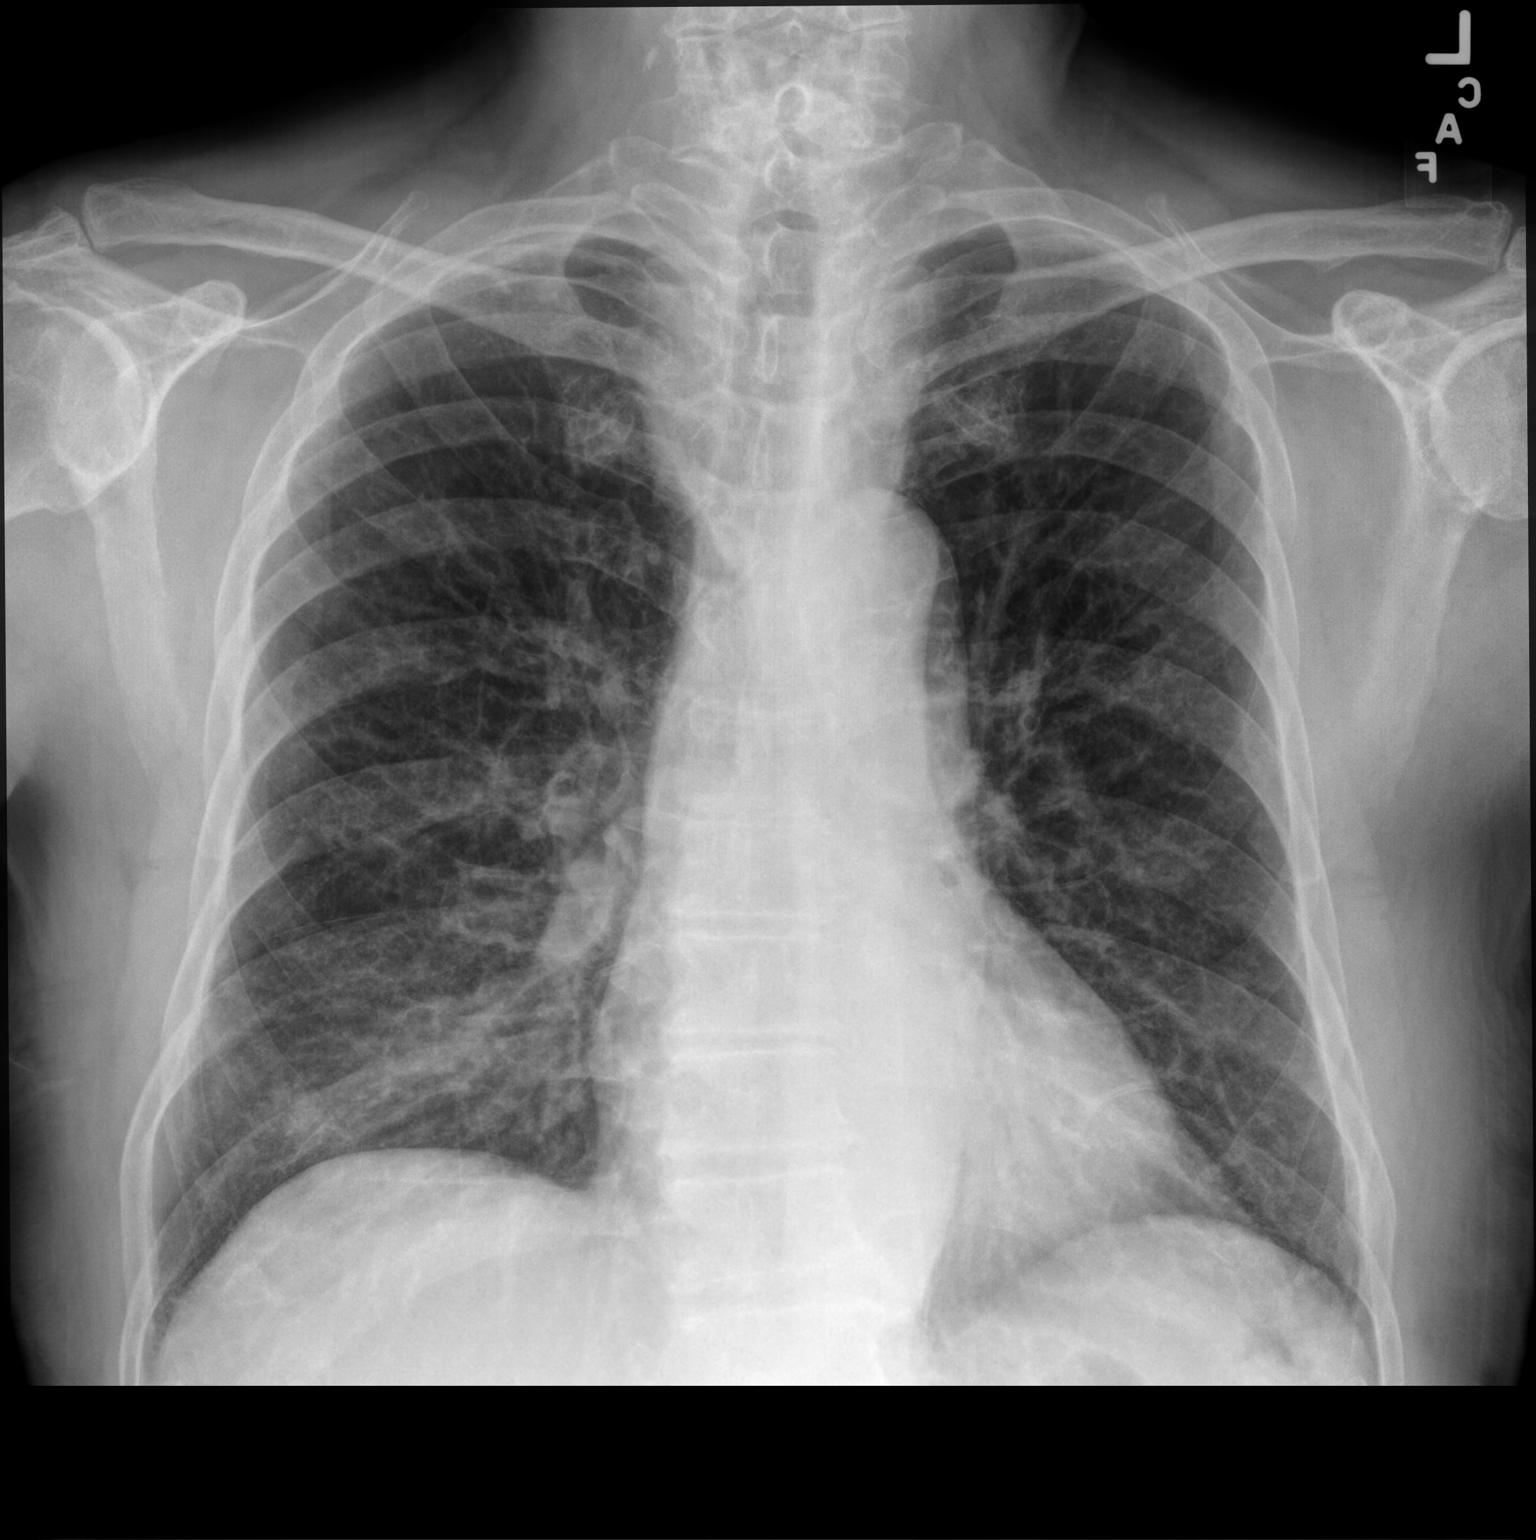

[chest lat]
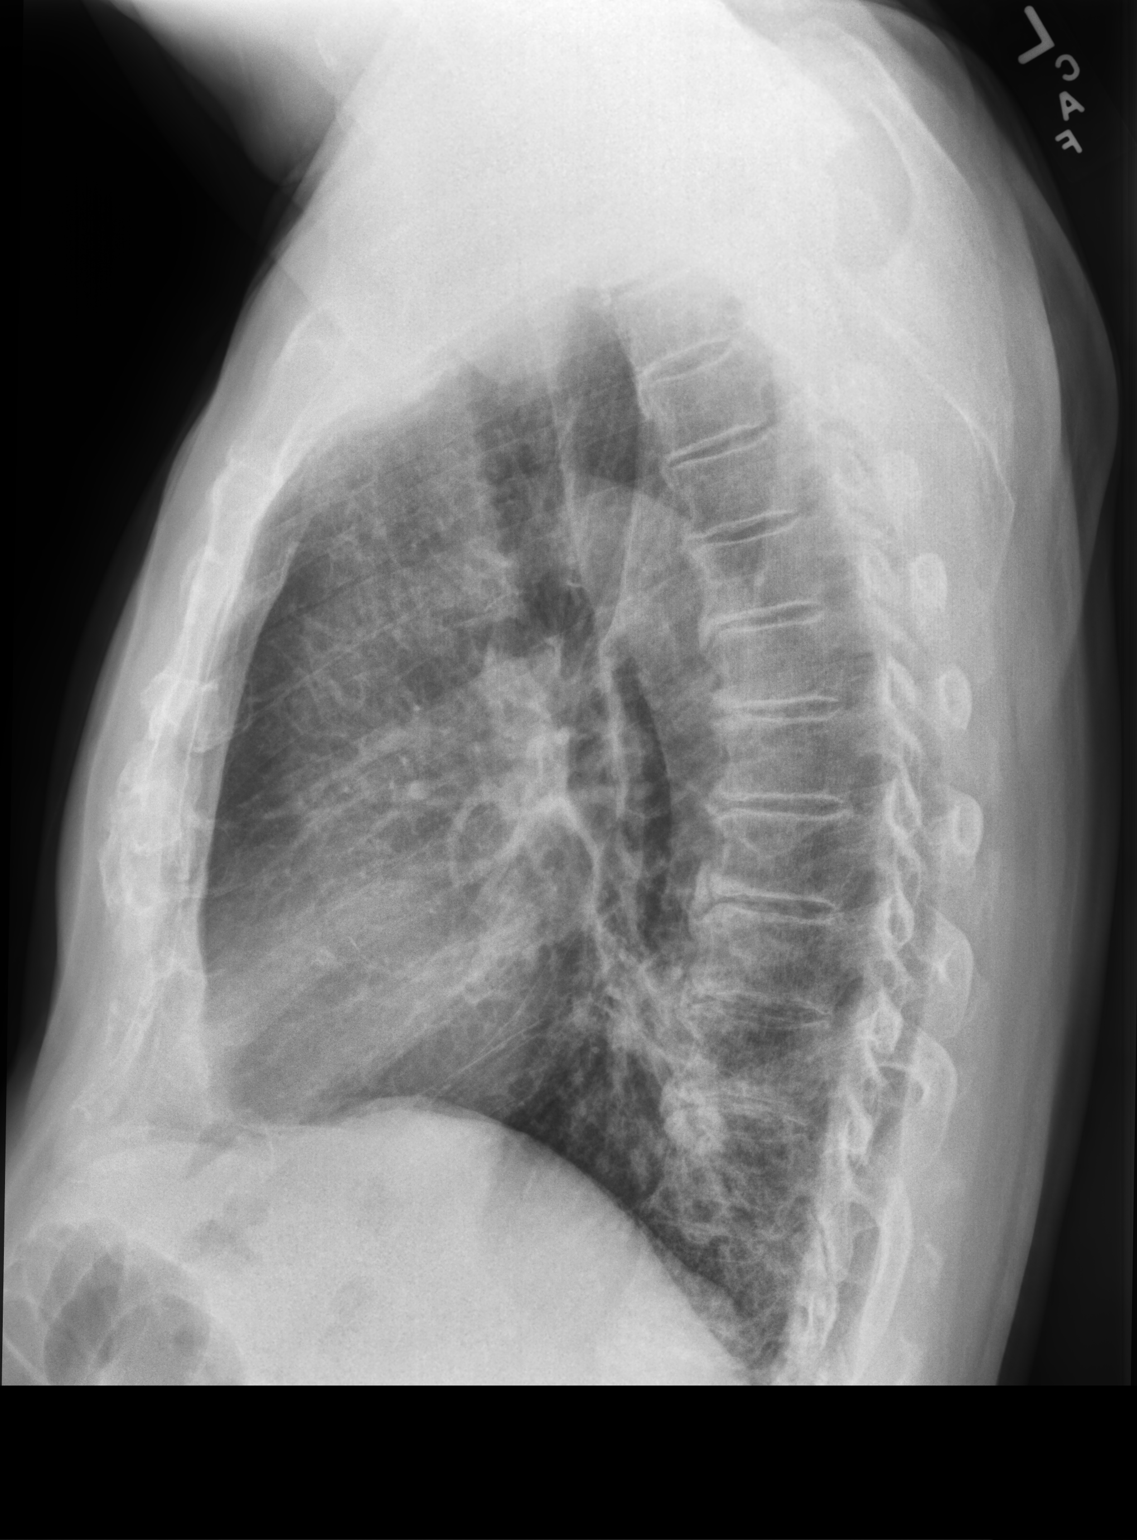

[2 of 2 positions shown; findings below may reference images not displayed]

FINDINGS: The heart size and mediastinal contours are stable. The previously
demonstrated asymmetric interstitial prominence on the left
demonstrates progressive improvement. Current study demonstrates
increased interstitial prominence on the right without
consolidation. There is probable central airway thickening
bilaterally. No pleural effusion or pneumothorax. Mild thoracic
spine degenerative changes are stable.
IMPRESSION: Interstitial prominence and central airway thickening, more
symmetric than on previous studies, likely due to bronchitis. Early
bronchopneumonia is possible, but there is no consolidation.

## 2019-08-10 ENCOUNTER — Inpatient Hospital Stay (HOSPITAL_BASED_OUTPATIENT_CLINIC_OR_DEPARTMENT_OTHER)
Admission: EM | Admit: 2019-08-10 | Discharge: 2019-09-13 | DRG: 177 | Disposition: E | Payer: Medicare Other | Attending: Internal Medicine | Admitting: Internal Medicine

## 2019-08-10 ENCOUNTER — Other Ambulatory Visit: Payer: Self-pay

## 2019-08-10 ENCOUNTER — Telehealth: Payer: Self-pay | Admitting: *Deleted

## 2019-08-10 ENCOUNTER — Emergency Department (HOSPITAL_BASED_OUTPATIENT_CLINIC_OR_DEPARTMENT_OTHER): Payer: Medicare Other

## 2019-08-10 ENCOUNTER — Encounter (HOSPITAL_BASED_OUTPATIENT_CLINIC_OR_DEPARTMENT_OTHER): Payer: Self-pay | Admitting: *Deleted

## 2019-08-10 DIAGNOSIS — U071 COVID-19: Secondary | ICD-10-CM | POA: Diagnosis not present

## 2019-08-10 DIAGNOSIS — J9601 Acute respiratory failure with hypoxia: Secondary | ICD-10-CM | POA: Diagnosis not present

## 2019-08-10 DIAGNOSIS — J189 Pneumonia, unspecified organism: Secondary | ICD-10-CM

## 2019-08-10 DIAGNOSIS — E785 Hyperlipidemia, unspecified: Secondary | ICD-10-CM | POA: Diagnosis not present

## 2019-08-10 DIAGNOSIS — I5032 Chronic diastolic (congestive) heart failure: Secondary | ICD-10-CM | POA: Diagnosis not present

## 2019-08-10 DIAGNOSIS — R0602 Shortness of breath: Secondary | ICD-10-CM

## 2019-08-10 DIAGNOSIS — Z7982 Long term (current) use of aspirin: Secondary | ICD-10-CM

## 2019-08-10 DIAGNOSIS — I248 Other forms of acute ischemic heart disease: Secondary | ICD-10-CM | POA: Diagnosis not present

## 2019-08-10 DIAGNOSIS — E43 Unspecified severe protein-calorie malnutrition: Secondary | ICD-10-CM | POA: Diagnosis present

## 2019-08-10 DIAGNOSIS — E876 Hypokalemia: Secondary | ICD-10-CM | POA: Diagnosis not present

## 2019-08-10 DIAGNOSIS — Z87891 Personal history of nicotine dependence: Secondary | ICD-10-CM | POA: Diagnosis not present

## 2019-08-10 DIAGNOSIS — R7989 Other specified abnormal findings of blood chemistry: Secondary | ICD-10-CM | POA: Diagnosis not present

## 2019-08-10 DIAGNOSIS — Z7984 Long term (current) use of oral hypoglycemic drugs: Secondary | ICD-10-CM | POA: Diagnosis not present

## 2019-08-10 DIAGNOSIS — E1165 Type 2 diabetes mellitus with hyperglycemia: Secondary | ICD-10-CM | POA: Diagnosis not present

## 2019-08-10 DIAGNOSIS — J8410 Pulmonary fibrosis, unspecified: Secondary | ICD-10-CM | POA: Diagnosis present

## 2019-08-10 DIAGNOSIS — E118 Type 2 diabetes mellitus with unspecified complications: Secondary | ICD-10-CM | POA: Diagnosis not present

## 2019-08-10 DIAGNOSIS — N179 Acute kidney failure, unspecified: Secondary | ICD-10-CM | POA: Diagnosis not present

## 2019-08-10 DIAGNOSIS — Z833 Family history of diabetes mellitus: Secondary | ICD-10-CM

## 2019-08-10 DIAGNOSIS — I2489 Other forms of acute ischemic heart disease: Secondary | ICD-10-CM | POA: Diagnosis present

## 2019-08-10 DIAGNOSIS — J1282 Pneumonia due to coronavirus disease 2019: Secondary | ICD-10-CM | POA: Diagnosis present

## 2019-08-10 DIAGNOSIS — J1289 Other viral pneumonia: Secondary | ICD-10-CM | POA: Diagnosis not present

## 2019-08-10 DIAGNOSIS — R0902 Hypoxemia: Secondary | ICD-10-CM

## 2019-08-10 DIAGNOSIS — R0600 Dyspnea, unspecified: Secondary | ICD-10-CM

## 2019-08-10 DIAGNOSIS — R791 Abnormal coagulation profile: Secondary | ICD-10-CM | POA: Diagnosis not present

## 2019-08-10 DIAGNOSIS — I5033 Acute on chronic diastolic (congestive) heart failure: Secondary | ICD-10-CM | POA: Diagnosis not present

## 2019-08-10 DIAGNOSIS — E875 Hyperkalemia: Secondary | ICD-10-CM | POA: Diagnosis not present

## 2019-08-10 DIAGNOSIS — J9621 Acute and chronic respiratory failure with hypoxia: Secondary | ICD-10-CM

## 2019-08-10 DIAGNOSIS — I361 Nonrheumatic tricuspid (valve) insufficiency: Secondary | ICD-10-CM | POA: Diagnosis not present

## 2019-08-10 DIAGNOSIS — E1159 Type 2 diabetes mellitus with other circulatory complications: Secondary | ICD-10-CM | POA: Diagnosis present

## 2019-08-10 DIAGNOSIS — R918 Other nonspecific abnormal finding of lung field: Secondary | ICD-10-CM | POA: Diagnosis not present

## 2019-08-10 DIAGNOSIS — E11649 Type 2 diabetes mellitus with hypoglycemia without coma: Secondary | ICD-10-CM | POA: Diagnosis not present

## 2019-08-10 DIAGNOSIS — T380X5A Adverse effect of glucocorticoids and synthetic analogues, initial encounter: Secondary | ICD-10-CM | POA: Diagnosis not present

## 2019-08-10 DIAGNOSIS — Z8249 Family history of ischemic heart disease and other diseases of the circulatory system: Secondary | ICD-10-CM

## 2019-08-10 DIAGNOSIS — J431 Panlobular emphysema: Secondary | ICD-10-CM | POA: Diagnosis present

## 2019-08-10 DIAGNOSIS — Z515 Encounter for palliative care: Secondary | ICD-10-CM | POA: Diagnosis not present

## 2019-08-10 DIAGNOSIS — Z66 Do not resuscitate: Secondary | ICD-10-CM | POA: Diagnosis present

## 2019-08-10 DIAGNOSIS — I152 Hypertension secondary to endocrine disorders: Secondary | ICD-10-CM | POA: Diagnosis present

## 2019-08-10 DIAGNOSIS — J984 Other disorders of lung: Secondary | ICD-10-CM | POA: Diagnosis not present

## 2019-08-10 DIAGNOSIS — J439 Emphysema, unspecified: Secondary | ICD-10-CM

## 2019-08-10 DIAGNOSIS — I110 Hypertensive heart disease with heart failure: Secondary | ICD-10-CM | POA: Diagnosis present

## 2019-08-10 DIAGNOSIS — IMO0002 Reserved for concepts with insufficient information to code with codable children: Secondary | ICD-10-CM | POA: Diagnosis present

## 2019-08-10 DIAGNOSIS — R778 Other specified abnormalities of plasma proteins: Secondary | ICD-10-CM | POA: Diagnosis present

## 2019-08-10 DIAGNOSIS — Z79899 Other long term (current) drug therapy: Secondary | ICD-10-CM

## 2019-08-10 DIAGNOSIS — I10 Essential (primary) hypertension: Secondary | ICD-10-CM | POA: Diagnosis not present

## 2019-08-10 DIAGNOSIS — D539 Nutritional anemia, unspecified: Secondary | ICD-10-CM | POA: Diagnosis not present

## 2019-08-10 DIAGNOSIS — E7800 Pure hypercholesterolemia, unspecified: Secondary | ICD-10-CM | POA: Diagnosis not present

## 2019-08-10 DIAGNOSIS — J432 Centrilobular emphysema: Secondary | ICD-10-CM | POA: Diagnosis not present

## 2019-08-10 DIAGNOSIS — Z7189 Other specified counseling: Secondary | ICD-10-CM | POA: Diagnosis not present

## 2019-08-10 DIAGNOSIS — J80 Acute respiratory distress syndrome: Secondary | ICD-10-CM

## 2019-08-10 LAB — FERRITIN: Ferritin: 456 ng/mL — ABNORMAL HIGH (ref 24–336)

## 2019-08-10 LAB — BRAIN NATRIURETIC PEPTIDE: B Natriuretic Peptide: 105.3 pg/mL — ABNORMAL HIGH (ref 0.0–100.0)

## 2019-08-10 LAB — COMPREHENSIVE METABOLIC PANEL
ALT: 18 U/L (ref 0–44)
AST: 39 U/L (ref 15–41)
Albumin: 2.8 g/dL — ABNORMAL LOW (ref 3.5–5.0)
Alkaline Phosphatase: 65 U/L (ref 38–126)
Anion gap: 16 — ABNORMAL HIGH (ref 5–15)
BUN: 22 mg/dL (ref 8–23)
CO2: 17 mmol/L — ABNORMAL LOW (ref 22–32)
Calcium: 8.1 mg/dL — ABNORMAL LOW (ref 8.9–10.3)
Chloride: 100 mmol/L (ref 98–111)
Creatinine, Ser: 1.21 mg/dL (ref 0.61–1.24)
GFR calc Af Amer: >60 mL/min (ref >60)
GFR calc non Af Amer: 55 mL/min — ABNORMAL LOW (ref >60)
Glucose, Bld: 333 mg/dL — ABNORMAL HIGH (ref 70–99)
Potassium: 3.5 mmol/L (ref 3.5–5.1)
Sodium: 133 mmol/L — ABNORMAL LOW (ref 135–145)
Total Bilirubin: 1.1 mg/dL (ref 0.3–1.2)
Total Protein: 6.7 g/dL (ref 6.5–8.1)

## 2019-08-10 LAB — CBC WITH DIFFERENTIAL/PLATELET
Abs Immature Granulocytes: 0.08 10*3/uL — ABNORMAL HIGH (ref 0.00–0.07)
Basophils Absolute: 0 10*3/uL (ref 0.0–0.1)
Basophils Relative: 0 %
Eosinophils Absolute: 0 10*3/uL (ref 0.0–0.5)
Eosinophils Relative: 0 %
HCT: 37.1 % — ABNORMAL LOW (ref 39.0–52.0)
Hemoglobin: 12.4 g/dL — ABNORMAL LOW (ref 13.0–17.0)
Immature Granulocytes: 1 %
Lymphocytes Relative: 8 %
Lymphs Abs: 0.4 10*3/uL — ABNORMAL LOW (ref 0.7–4.0)
MCH: 31.6 pg (ref 26.0–34.0)
MCHC: 33.4 g/dL (ref 30.0–36.0)
MCV: 94.6 fL (ref 80.0–100.0)
Monocytes Absolute: 0.3 10*3/uL (ref 0.1–1.0)
Monocytes Relative: 5 %
Neutro Abs: 4.9 10*3/uL (ref 1.7–7.7)
Neutrophils Relative %: 86 %
Platelets: 187 10*3/uL (ref 150–400)
RBC: 3.92 MIL/uL — ABNORMAL LOW (ref 4.22–5.81)
RDW: 12.6 % (ref 11.5–15.5)
WBC: 5.7 10*3/uL (ref 4.0–10.5)
nRBC: 0 % (ref 0.0–0.2)

## 2019-08-10 LAB — POCT I-STAT 7, (LYTES, BLD GAS, ICA,H+H)
Acid-base deficit: 3 mmol/L — ABNORMAL HIGH (ref 0.0–2.0)
Bicarbonate: 19.8 mmol/L — ABNORMAL LOW (ref 20.0–28.0)
Calcium, Ion: 1.16 mmol/L (ref 1.15–1.40)
HCT: 32 % — ABNORMAL LOW (ref 39.0–52.0)
Hemoglobin: 10.9 g/dL — ABNORMAL LOW (ref 13.0–17.0)
O2 Saturation: 97 %
Patient temperature: 101.7
Potassium: 3.5 mmol/L (ref 3.5–5.1)
Sodium: 136 mmol/L (ref 135–145)
TCO2: 21 mmol/L — ABNORMAL LOW (ref 22–32)
pCO2 arterial: 31.5 mmHg — ABNORMAL LOW (ref 32.0–48.0)
pH, Arterial: 7.414 (ref 7.350–7.450)
pO2, Arterial: 96 mmHg (ref 83.0–108.0)

## 2019-08-10 LAB — FIBRINOGEN: Fibrinogen: 729 mg/dL — ABNORMAL HIGH (ref 210–475)

## 2019-08-10 LAB — LACTATE DEHYDROGENASE: LDH: 409 U/L — ABNORMAL HIGH (ref 98–192)

## 2019-08-10 LAB — C-REACTIVE PROTEIN: CRP: 15 mg/dL — ABNORMAL HIGH (ref <1.0)

## 2019-08-10 LAB — TROPONIN I (HIGH SENSITIVITY): Troponin I (High Sensitivity): 286 ng/L (ref <18)

## 2019-08-10 LAB — PROCALCITONIN: Procalcitonin: 0.25 ng/mL

## 2019-08-10 LAB — LACTIC ACID, PLASMA: Lactic Acid, Venous: 2.1 mmol/L (ref 0.5–1.9)

## 2019-08-10 LAB — SARS CORONAVIRUS 2 AG (30 MIN TAT): SARS Coronavirus 2 Ag: POSITIVE — AB

## 2019-08-10 LAB — TRIGLYCERIDES: Triglycerides: 141 mg/dL (ref <150)

## 2019-08-10 LAB — CBG MONITORING, ED: Glucose-Capillary: 327 mg/dL — ABNORMAL HIGH (ref 70–99)

## 2019-08-10 LAB — D-DIMER, QUANTITATIVE: D-Dimer, Quant: 7.38 ug/mL-FEU — ABNORMAL HIGH (ref 0.00–0.50)

## 2019-08-10 MED ORDER — IOHEXOL 350 MG/ML SOLN: 100.0000 mL | Freq: Once | INTRAVENOUS | Status: DC | PRN

## 2019-08-10 MED ORDER — DEXAMETHASONE SODIUM PHOSPHATE 10 MG/ML IJ SOLN
10.0000 mg | Freq: Once | INTRAMUSCULAR | Status: AC
  Administered 2019-08-10: 10 mg via INTRAVENOUS
  Filled 2019-08-10: qty 1

## 2019-08-10 MED ORDER — ZINC SULFATE 220 (50 ZN) MG PO CAPS
220.0000 mg | ORAL_CAPSULE | Freq: Every day | ORAL | Status: DC
  Administered 2019-08-10 – 2019-09-03 (×25): 220 mg via ORAL
  Filled 2019-08-10 (×27): qty 1

## 2019-08-10 MED ORDER — SODIUM CHLORIDE 0.9 % IV SOLN
1.0000 g | Freq: Once | INTRAVENOUS | Status: AC
  Administered 2019-08-10: 1 g via INTRAVENOUS
  Filled 2019-08-10: qty 10

## 2019-08-10 MED ORDER — GUAIFENESIN-DM 100-10 MG/5ML PO SYRP
10.0000 mL | ORAL_SOLUTION | ORAL | Status: DC | PRN
  Filled 2019-08-10: qty 10

## 2019-08-10 MED ORDER — ENOXAPARIN SODIUM 40 MG/0.4ML ~~LOC~~ SOLN: 0.5000 mg/kg | Freq: Two times a day (BID) | SUBCUTANEOUS | Status: DC

## 2019-08-10 MED ORDER — DOCUSATE SODIUM 100 MG PO CAPS
100.0000 mg | ORAL_CAPSULE | Freq: Every day | ORAL | Status: DC
  Administered 2019-08-12 – 2019-09-03 (×22): 100 mg via ORAL
  Filled 2019-08-10 (×25): qty 1

## 2019-08-10 MED ORDER — IPRATROPIUM-ALBUTEROL 20-100 MCG/ACT IN AERS
1.0000 | INHALATION_SPRAY | Freq: Four times a day (QID) | RESPIRATORY_TRACT | Status: DC
  Administered 2019-08-11: 1 via RESPIRATORY_TRACT
  Filled 2019-08-10: qty 4

## 2019-08-10 MED ORDER — INSULIN ASPART 100 UNIT/ML ~~LOC~~ SOLN
0.0000 [IU] | Freq: Every day | SUBCUTANEOUS | Status: DC
  Administered 2019-08-11: 5 [IU] via SUBCUTANEOUS
  Administered 2019-08-11: 3 [IU] via SUBCUTANEOUS

## 2019-08-10 MED ORDER — DEXAMETHASONE 6 MG PO TABS
6.0000 mg | ORAL_TABLET | ORAL | Status: AC
  Administered 2019-08-11 – 2019-08-21 (×11): 6 mg via ORAL
  Filled 2019-08-10 (×12): qty 1

## 2019-08-10 MED ORDER — SODIUM CHLORIDE 0.9 % IV SOLN
200.0000 mg | Freq: Once | INTRAVENOUS | Status: AC
  Administered 2019-08-10: 22:00:00 200 mg via INTRAVENOUS
  Filled 2019-08-10: qty 40

## 2019-08-10 MED ORDER — SODIUM CHLORIDE 0.9 % IV SOLN
INTRAVENOUS | Status: DC | PRN
  Administered 2019-08-10 (×2): 250 mL via INTRAVENOUS

## 2019-08-10 MED ORDER — ACETAMINOPHEN 500 MG PO TABS
ORAL_TABLET | ORAL | Status: AC
  Administered 2019-08-10: 1000 mg via ORAL
  Filled 2019-08-10: qty 2

## 2019-08-10 MED ORDER — HYDROCOD POLST-CPM POLST ER 10-8 MG/5ML PO SUER: 5.0000 mL | Freq: Two times a day (BID) | ORAL | Status: DC | PRN

## 2019-08-10 MED ORDER — SODIUM CHLORIDE 0.9 % IV SOLN
100.0000 mg | Freq: Every day | INTRAVENOUS | Status: AC
  Administered 2019-08-11 – 2019-08-14 (×4): 100 mg via INTRAVENOUS
  Filled 2019-08-10 (×5): qty 20

## 2019-08-10 MED ORDER — ACETAMINOPHEN 500 MG PO TABS: 1000.0000 mg | ORAL_TABLET | Freq: Once | ORAL | Status: AC

## 2019-08-10 MED ORDER — INSULIN ASPART 100 UNIT/ML ~~LOC~~ SOLN
0.0000 [IU] | Freq: Three times a day (TID) | SUBCUTANEOUS | Status: DC
  Administered 2019-08-11: 09:00:00 11 [IU] via SUBCUTANEOUS
  Administered 2019-08-11: 8 [IU] via SUBCUTANEOUS
  Administered 2019-08-11: 3 [IU] via SUBCUTANEOUS
  Administered 2019-08-12: 08:00:00 11 [IU] via SUBCUTANEOUS
  Administered 2019-08-12: 13:00:00 15 [IU] via SUBCUTANEOUS
  Administered 2019-08-12: 5 [IU] via SUBCUTANEOUS

## 2019-08-10 MED ORDER — SODIUM CHLORIDE 0.9 % IV SOLN
500.0000 mg | Freq: Once | INTRAVENOUS | Status: AC
  Administered 2019-08-10: 500 mg via INTRAVENOUS
  Filled 2019-08-10: qty 500

## 2019-08-10 MED ORDER — ACETAMINOPHEN 325 MG PO TABS: 650.0000 mg | ORAL_TABLET | Freq: Four times a day (QID) | ORAL | Status: DC | PRN

## 2019-08-10 MED ORDER — ASCORBIC ACID 500 MG PO TABS
500.0000 mg | ORAL_TABLET | Freq: Every day | ORAL | Status: DC
  Administered 2019-08-10 – 2019-09-03 (×25): 500 mg via ORAL
  Filled 2019-08-10 (×27): qty 1

## 2019-08-10 MED ORDER — ENOXAPARIN SODIUM 80 MG/0.8ML ~~LOC~~ SOLN
1.0000 mg/kg | Freq: Two times a day (BID) | SUBCUTANEOUS | Status: DC
  Administered 2019-08-10 – 2019-08-15 (×10): 65 mg via SUBCUTANEOUS
  Filled 2019-08-10 (×12): qty 0.8

## 2019-08-10 NOTE — ED Notes (Signed)
Date and time results received: 08/25/2019 0836   Test: Covid Critical Value: Positive  Name of Provider Notified: Dykstra  Orders Received? Or Actions Taken?: No orders

## 2019-08-10 NOTE — Telephone Encounter (Signed)
Wife said pt. Has been ill, no appetite,  Regarding his history, referral to see MD, said appointment is 08/18/2019. But no energy to walk. And wished to have IVF. Told  If needed, go to urgent care or ER. Worried dehydrated and electrolytes imbalance. Will follow up

## 2019-08-10 NOTE — ED Notes (Signed)
Date and time results received: 08/12/2019 1830    Test: Lactic Acid Critical Value: 2.1  Name of Provider Notified: Dr. Reita May  Orders Received? Or Actions Taken?: No orders.  No actions.

## 2019-08-10 NOTE — ED Notes (Signed)
ED Provider at bedside. 

## 2019-08-10 NOTE — ED Notes (Signed)
Pt placed on 100% NRB and 15L HFNC.  Patient initial SpO2 on RA was 49%. Increased to 93%.  Pt is in no distress nor shows any signs of increased WOB.

## 2019-08-10 NOTE — ED Triage Notes (Signed)
Fever, chills, body aches, headache, cough, sob, fatigue, loss off taste, loss of smell. No known Covid exposure.

## 2019-08-10 NOTE — Progress Notes (Signed)
Attempted to contact son, Leigh Aurora, at [phone] for and update as well as questions for his fathers admission. Left voicemail with call back number.

## 2019-08-10 NOTE — ED Notes (Signed)
Date and time results received: 27-Aug-2019 1905  Test: troponin Critical Value: 286  Name of Provider Notified: Dr. Roslynn Amble  Orders Received? Or Actions Taken?: no new orders

## 2019-08-10 NOTE — ED Provider Notes (Signed)
[facility] EMERGENCY DEPARTMENT Provider Note   CSN: [id number] Arrival date & time: 2019/08/26  1701     History Chief Complaint  Patient presents with   Shortness of Breath    Aaron Blair is a 85 y.o. male.  Presents to the emergency room with chief complaint shortness of breath.  Over the past couple weeks patient has had increasing cough, with past couple days has noted some increased difficulty in breathing.  Cough nonproductive none bloody.  Has had chills, subjective fever, body aches and intermittent headaches.  Has loss of taste as well as loss of smell.  No known Covid exposures, has not seen a physician regarding symptoms thus far.  Has past medical history of diabetes, hypertension, hyperlipidemia.  No associated chest pain.  History obtained via translator, as well as via patient's son.  Patient speaks some [place], primary language is [place].   HPI     Past Medical History:  Diagnosis Date   Chronic gastritis    Diabetes type 2, uncontrolled (HCC)    Hyperlipidemia    Hypertension    Lumbar herniated disc, L3-4    Vertigo     Patient Active Problem List   Diagnosis Date Noted   Moderately increased albuminuria 06/02/2019   Bilateral lower extremity edema 08/23/2018   Vitamin D deficiency 08/23/2018   Former smoker 03/23/2017   Diastolic dysfunction 03/02/2017   Hypertension associated with diabetes (HCC) 12/12/2016   Hyperlipidemia associated with type 2 diabetes mellitus (HCC) 12/12/2016   Type 2 diabetes mellitus with hyperglycemia (HCC) 12/12/2016   Left carotid stenosis 07/17/2016    Past Surgical History:  Procedure Laterality Date   BACK SURGERY     COLONOSCOPY     ENDARTERECTOMY Left 07/17/2016   Procedure: ENDARTERECTOMY CAROTID;  Surgeon: Sherren Kerns, MD;  Location: [facility] OR;  Service: Vascular;  Laterality: Left;   SPINE SURGERY  02/05/2016       Family History  Problem Relation Age of Onset   Diabetes  Father    Heart attack Father     Social History   Tobacco Use   Smoking status: Former Smoker    Years: 60.00    Quit date: 06/28/2011    Years since quitting: 8.1   Smokeless tobacco: Never Used   Tobacco comment: Quit 3 years ago.  Substance Use Topics   Alcohol use: No    Alcohol/week: 0.0 standard drinks   Drug use: No    Home Medications Prior to Admission medications   Medication Sig Start Date End Date Taking? Authorizing Provider  acarbose (PRECOSE) 25 MG tablet Take 1 tablet (25 mg total) by mouth 3 (three) times daily with meals. 06/01/19   Helane Rima, DO  amLODipine (NORVASC) 5 MG tablet Take 1 tablet (5 mg total) by mouth daily. 06/01/19   Helane Rima, DO  aspirin 81 MG tablet Take 81 mg by mouth daily.    [provider]  Elastic Bandages & Supports (MEDICAL COMPRESSION SOCKS) MISC Used to help with swelling 08/19/18   Helane Rima, DO  glimepiride (AMARYL) 4 MG tablet Take 1 tablet (4 mg total) by mouth daily with breakfast. 06/01/19   Helane Rima, DO  glucose blood (ONETOUCH VERIO) test strip 1 each by Other route 3 (three) times daily. Use as instructed 06/01/19   Helane Rima, DO  hydrochlorothiazide (HYDRODIURIL) 25 MG tablet Take 1 tablet (25 mg total) by mouth daily. 06/01/19   Helane Rima, DO  Lancets [facility] PLUS  LANCET33G) MISC 1 each by Other route 3 (three) times daily. 06/01/19   Briscoe Deutscher, DO  lisinopril (ZESTRIL) 20 MG tablet Take 1 tablet (20 mg total) by mouth daily. 06/01/19   Briscoe Deutscher, DO  metFORMIN (GLUCOPHAGE-XR) 500 MG 24 hr tablet Take 2 tablets (1,000 mg total) by mouth at bedtime. 06/01/19   Briscoe Deutscher, DO  pioglitazone (ACTOS) 45 MG tablet Take 1 tablet (45 mg total) by mouth daily. 06/01/19   Briscoe Deutscher, DO  lisinopril-hydrochlorothiazide (PRINZIDE,ZESTORETIC) 20-25 MG tablet Take 1 tablet by mouth daily. 05/27/17 12/31/17  Briscoe Deutscher, DO    Allergies    No known allergies and  Statins  Review of Systems   Review of Systems  Constitutional: Positive for chills, fatigue and fever.  HENT: Negative for ear pain and sore throat.   Eyes: Negative for pain and visual disturbance.  Respiratory: Positive for cough and shortness of breath.   Cardiovascular: Negative for chest pain and palpitations.  Gastrointestinal: Negative for abdominal pain and vomiting.  Genitourinary: Negative for dysuria and hematuria.  Musculoskeletal: Negative for arthralgias and back pain.  Skin: Negative for color change and rash.  Neurological: Negative for seizures and syncope.  All other systems reviewed and are negative.   Physical Exam Updated Vital Signs BP (!) 155/84    Pulse (!) 101    Temp 99.5 F (37.5 C) (Oral)    Resp (!) 23    Ht 5\' 3"  (1.6 m)    Wt 63.5 kg    SpO2 95%    BMI 24.80 kg/m   Physical Exam Vitals and nursing note reviewed.  Constitutional:      Comments: Mild tachypnea, not in distress  HENT:     Head: Normocephalic and atraumatic.  Eyes:     Conjunctiva/sclera: Conjunctivae normal.  Cardiovascular:     Rate and Rhythm: Regular rhythm. Tachycardia present.     Heart sounds: No murmur.  Pulmonary:     Comments: Mild tachypnea, bilateral coarse breath sounds, not in respiratory distress, speaks in full sentences Abdominal:     Palpations: Abdomen is soft.     Tenderness: There is no abdominal tenderness.  Musculoskeletal:     Cervical back: Neck supple.     Right lower leg: No edema.     Left lower leg: No edema.  Skin:    General: Skin is warm and dry.     Capillary Refill: Capillary refill takes less than 2 seconds.  Neurological:     General: No focal deficit present.     Mental Status: He is alert and oriented to person, place, and time.     ED Results / Procedures / Treatments   Labs (all labs ordered are listed, but only abnormal results are displayed) Labs Reviewed  CBC WITH DIFFERENTIAL/PLATELET - Abnormal; Notable for the following  components:      Result Value   RBC 3.92 (*)    Hemoglobin 12.4 (*)    HCT 37.1 (*)    Lymphs Abs 0.4 (*)    Abs Immature Granulocytes 0.08 (*)    All other components within normal limits  CBG MONITORING, ED - Abnormal; Notable for the following components:   Glucose-Capillary 327 (*)    All other components within normal limits  POCT I-STAT 7, (LYTES, BLD GAS, ICA,H+H) - Abnormal; Notable for the following components:   pCO2 arterial 31.5 (*)    Bicarbonate 19.8 (*)    TCO2 21 (*)    Acid-base deficit 3.0 (*)  HCT 32.0 (*)    Hemoglobin 10.9 (*)    All other components within normal limits  SARS CORONAVIRUS 2 AG (30 MIN TAT)  SARS CORONAVIRUS 2 (TAT 6-24 HRS)  CULTURE, BLOOD (ROUTINE X 2)  CULTURE, BLOOD (ROUTINE X 2)  LACTIC ACID, PLASMA  LACTIC ACID, PLASMA  COMPREHENSIVE METABOLIC PANEL  D-DIMER, QUANTITATIVE (NOT AT [facility])  PROCALCITONIN  LACTATE DEHYDROGENASE  FERRITIN  TRIGLYCERIDES  FIBRINOGEN  C-REACTIVE PROTEIN  BRAIN NATRIURETIC PEPTIDE  BLOOD GAS, ARTERIAL  TROPONIN I (HIGH SENSITIVITY)    EKG None  Radiology DG Chest Portable 1 View  Result Date: 11/30/2018 CLINICAL DATA:  Shortness of breath EXAM: PORTABLE CHEST 1 VIEW COMPARISON:  10/08/2017 FINDINGS: There is scattered pulmonary opacities bilaterally. The heart size is mildly enlarged. Aortic calcifications are noted. There is no pneumothorax. No large pleural effusion. No acute osseous abnormality. IMPRESSION: Scattered pulmonary opacities bilaterally can be seen in patients with an atypical infectious process versus pulmonary edema. Electronically Signed   By: Katherine Mantlehristopher  Green M.D.   On: 11/30/2018 17:55    Procedures Procedures (including critical care time)  Medications Ordered in ED Medications  cefTRIAXone (ROCEPHIN) 1 g in sodium chloride 0.9 % 100 mL IVPB (has no administration in time range)  azithromycin (ZITHROMAX) 500 mg in sodium chloride 0.9 % 250 mL IVPB (has no administration  in time range)    ED Course  I have reviewed the triage vital signs and the nursing notes.  Pertinent labs & imaging results that were available during my care of the patient were reviewed by me and considered in my medical decision making (see chart for details).  Clinical Course as of Aug 10 2107  Tue Aug 10, 2019  1838 SARS Coronavirus 2 Ag(!): POSITIVE [RD]  1927 Discussed with    [RD]  2106 Transferred to [facility]   [RD]    Clinical Course User Index [RD] Milagros Lollykstra, Baron Parmelee S, MD   MDM Rules/Calculators/A&P                      85 year old male presents to ER with flulike symptoms, on triage vitals noted to be profoundly hypoxic. Patient remarkably is not in respiratory distress.  Saturations improved dramatically with nonrebreather.  Chest x-ray concerning for atypical pneumonia, his rapid Covid was positive.  I suspect this is the most likely etiology for his hypoxia.  Significant elevated dimer, cannot rule out pulmonary embolism on contributing to this.  Discussed with hospitalist, initially ordered CTPA to be performed here however we were unable to get a large enough IV for the contrasted study, notified hospitalist, [facility] after vascular access team is able to get a better appropriate IV.   Noted mildly elevated troponin, no ST segment changes on EKG, suspect this is demand related to his profound hypoxia.  No chest pain. Doubt ACS.  Discussed CODE STATUS, discussed severity of disease, patient would not want to be placed on ventilator or any form of life support.  Okay with other interventions - abx, fluids, supplemental O2.  Updated chart and staff.   Final Clinical Impression(s) / ED Diagnoses Final diagnoses:  COVID-19  Community acquired pneumonia, unspecified laterality  Acute on chronic respiratory failure with hypoxia [facility](HCC)    Rx / DC Orders ED Discharge Orders    None       Milagros Lollykstra, Deryk Bozman S, MD Nov 09, 2018 2115

## 2019-08-10 NOTE — H&P (Addendum)
TRH H&P   Patient Demographics:    Aaron Blair, is a 85 y.o. male  MRN: [id number]   DOB - 09/30/1933  Admit Date - 07/27/2019  Outpatient Primary MD for the patient is Helane Rima, DO  Patient coming from: [facility]  Chief Complaint  Patient presents with  . Shortness of Breath      HPI:    Aaron Blair  is a 85 y.o. male, with NIDDM, HTN, HLD presents as a transfer from [facility] for management of respiratory failure with hypoxia.  Patient does not speak [place], history obtained from family over the phone.  Over the past week patient has had subjective fevers with chills, poor appetite and started having shortness of breath with lethargy today prompting the ER presentation.  In the ER he was found to have oxygen saturation in the 50s on room air, CXR consistent with viral pneumonia, SARS-CoV-2 positive.  Patient transferred for further management.  No reported nausea, vomiting, diarrhea.  He has intermittent cough which is nonproductive.  No reported chest pain, palpitations, orthopnea, PND, lower extremity edema.   Review of systems:  Review of Systems:  Constitutional: See HPI HEENT: negative for earaches, epistaxis, or sore throat Respiratory: See HPI Cardiovascular: negative for chest pain, palpitations, or syncope GU: negative for dysuria, urinary frequency, urinary urgency, hematuria Gastrointestinal: negative for abdominal pain, constipation, diarrhea, nausea or vomiting Musculoskeletal: negative for arthralgias, back pain or myalgias Neurological: negative for dizziness, headaches or weakness Behavioral/Psych: negative for suicidal or homicidal ideation Skin:negative for rash Heme: negative for bruises Endo:  negative for hair loss, weight gain/loss  With Past History of the following :   Past Medical History:  Diagnosis Date  . Chronic gastritis   . Diabetes type 2, uncontrolled (HCC)   . Hyperlipidemia   . Hypertension   . Lumbar herniated disc, L3-4   . Vertigo      Past Surgical History:  Procedure Laterality Date  . BACK SURGERY    . COLONOSCOPY    . ENDARTERECTOMY Left 07/17/2016   Procedure: ENDARTERECTOMY CAROTID;  Surgeon: Sherren Kerns, MD;  Location: [facility] OR;  Service: Vascular;  Laterality: Left;  . SPINE SURGERY  02/05/2016    Social History:   Social History   Tobacco Use  . Smoking status: Former Smoker    Years: 60.00  Quit date: 06/28/2011    Years since quitting: 8.1  . Smokeless tobacco: Never Used  . Tobacco comment: Quit 3 years ago.  Substance Use Topics  . Alcohol use: No    Alcohol/week: 0.0 standard drinks    Family History :    Family History  Problem Relation Age of Onset  . Diabetes Father   . Heart attack Father     Home Medications:   Prior to Admission medications   Medication Sig Start Date End Date Taking? Authorizing Provider  acarbose (PRECOSE) 25 MG tablet Take 1 tablet (25 mg total) by mouth 3 (three) times daily with meals. 06/01/19   Helane Rima, DO  amLODipine (NORVASC) 5 MG tablet Take 1 tablet (5 mg total) by mouth daily. 06/01/19   Helane Rima, DO  aspirin 81 MG tablet Take 81 mg by mouth daily.    [provider]  Elastic Bandages & Supports (MEDICAL COMPRESSION SOCKS) MISC Used to help with swelling 08/19/18   Helane Rima, DO  glimepiride (AMARYL) 4 MG tablet Take 1 tablet (4 mg total) by mouth daily with breakfast. 06/01/19   Helane Rima, DO  glucose blood (ONETOUCH VERIO) test strip 1 each by Other route 3 (three) times daily. Use as instructed 06/01/19   Helane Rima, DO  hydrochlorothiazide (HYDRODIURIL) 25 MG tablet Take 1 tablet (25 mg total) by mouth daily. 06/01/19   Helane Rima, DO    Lancets [facility] DELICA PLUS [place]) MISC 1 each by Other route 3 (three) times daily. 06/01/19   Helane Rima, DO  lisinopril (ZESTRIL) 20 MG tablet Take 1 tablet (20 mg total) by mouth daily. 06/01/19   Helane Rima, DO  metFORMIN (GLUCOPHAGE-XR) 500 MG 24 hr tablet Take 2 tablets (1,000 mg total) by mouth at bedtime. 06/01/19   Helane Rima, DO  pioglitazone (ACTOS) 45 MG tablet Take 1 tablet (45 mg total) by mouth daily. 06/01/19   Helane Rima, DO  lisinopril-hydrochlorothiazide (PRINZIDE,ZESTORETIC) 20-25 MG tablet Take 1 tablet by mouth daily. 05/27/17 12/31/17  Helane Rima, DO    Allergies:    Allergies  Allergen Reactions  . Statins     Physical Exam:   Vitals  Blood pressure (!) 150/89, pulse 87, temperature 98.2 F (36.8 C), temperature source Axillary, resp. rate (!) 22, height 5\' 3"  (1.6 m), weight 63.5 kg, SpO2 93 %.  Physical Exam   Constitutional - resting comfortably, no acute distress Eyes - pupils equal round and reactive to light and accomodation, extra ocular movements intact Nose - no gross deformity or drainage Mouth - no oral lesions noted Throat - no swelling or erythema Neck - supple, no JVD   CV - (+)S1S2, no murmurs  Resp -diffuse expiratory crackles,  GI - (+)BS, soft, non-tender, non-distended Extrem - no clubbing, cyanosis, or peripheral edema  Skin - no rashes or wounds Neuro - alert, aware, oriented to person/place/time  Psych - normal affect, no anxiety   Patient has Pressure Ulcer on Admission?: no   Data Review:    CBC Recent Labs  Lab 08/04/2019 1740 08/03/2019 1747  WBC 5.7  --   HGB 12.4* 10.9*  HCT 37.1* 32.0*  PLT 187  --   MCV 94.6  --   MCH 31.6  --   MCHC 33.4  --   RDW 12.6  --   LYMPHSABS 0.4*  --   MONOABS 0.3  --   EOSABS 0.0  --   BASOSABS 0.0  --    ------------------------------------------------------------------------------------------------------------------  Chemistries  Recent Labs  Lab  07/14/2019 1740 07/25/2019 1747  NA 133* 136  K 3.5 3.5  CL 100  --   CO2 17*  --   GLUCOSE 333*  --   BUN 22  --   CREATININE 1.21  --   CALCIUM 8.1*  --   AST 39  --   ALT 18  --   ALKPHOS 65  --   BILITOT 1.1  --    ------------------------------------------------------------------------------------------------------------------ estimated creatinine clearance is 37.2 mL/min (by C-G formula based on SCr of 1.21 mg/dL). ------------------------------------------------------------------------------------------------------------------ No results for input(s): TSH, T4TOTAL, T3FREE, THYROIDAB in the last 72 hours.  Invalid input(s): FREET3  Coagulation profile No results for input(s): INR, PROTIME in the last 168 hours. ------------------------------------------------------------------------------------------------------------------- Recent Labs    08/07/2019 1740  DDIMER 7.38*   -------------------------------------------------------------------------------------------------------------------  Cardiac Enzymes No results for input(s): CKMB, TROPONINI, MYOGLOBIN in the last 168 hours.  Invalid input(s): CK ------------------------------------------------------------------------------------------------------------------    Component Value Date/Time   BNP 105.3 (H) 07/17/2019 1740     ---------------------------------------------------------------------------------------------------------------  Urinalysis    Component Value Date/Time   COLORURINE YELLOW 06/01/2019 0853   APPEARANCEUR CLEAR 06/01/2019 0853   LABSPEC 1.020 06/01/2019 0853   PHURINE 6.5 06/01/2019 0853   GLUCOSEU 250 (A) 06/01/2019 0853   HGBUR NEGATIVE 06/01/2019 [place] 06/01/2019 0853   KETONESUR NEGATIVE 06/01/2019 0853   PROTEINUR 100 (A) 12/15/2016 1216   UROBILINOGEN 0.2 06/01/2019 0853   NITRITE NEGATIVE 06/01/2019 0853   LEUKOCYTESUR NEGATIVE 06/01/2019 0853     ----------------------------------------------------------------------------------------------------------------   Imaging Results:    DG Chest Portable 1 View  Result Date: 08/09/2019 CLINICAL DATA:  Shortness of breath EXAM: PORTABLE CHEST 1 VIEW COMPARISON:  10/08/2017 FINDINGS: There is scattered pulmonary opacities bilaterally. The heart size is mildly enlarged. Aortic calcifications are noted. There is no pneumothorax. No large pleural effusion. No acute osseous abnormality. IMPRESSION: Scattered pulmonary opacities bilaterally can be seen in patients with an atypical infectious process versus pulmonary edema. Electronically Signed   By: Constance Holster M.D.   On: 08/12/2019 17:55    Assessment & Plan:    Principal Problem:   Acute hypoxemic respiratory failure due to severe acute respiratory syndrome coronavirus 2 (SARS-CoV-2) disease (HCC) Active Problems:   Hypertension   Hyperlipidemia   Diabetes type 2, uncontrolled ([place])     Acute hypoxemic respiratory failure due to SARS-CoV-2 disease: Patient with known SARS-CoV-2 exposure presenting with poor oral intake, fever/chills, lethargy, found profoundly hypoxic with sats in the 50s on room air. Date of Dx: 07/24/2019 Oxygen requirements: 15L HFNC/NRB Antibiotics: Received Rocephin/azithromycin, discontinued with procalcitonin 0.25 Diuretics: Lasix 40mg  once Vitamin C and Zinc: Per protocol Remdesivir: Started on 12/29 Steroids: Started on 12/29 Actemra:  Family decision process Convalescent Plasma: Not given yet DVT Prophylaxis:   Full dose Lovenox while waiting CTA chest    Hypertension: Blood pressure is close to goal.  Resume his amlodipine, lisinopril and hydrochlorothiazide when verified    Hyperlipidemia: Cardiac diet.  Resume his Actos at discharge.    Diabetes type 2, uncontrolled with hyperglycemia: Diabetic diet.  Fingersticks before meals and at bedtime.  Holding his oral diabetes medications.  Sliding  scale insulin.  DVT Prophylaxis Lovenox  AM Labs Ordered, also please review Full Orders  Family Communication: Admission, patients condition and plan of care including tests being ordered have been discussed with the patient and daughter who indicate understanding and agree with the plan and Code Status.  Code Status  DNR  Likely DC to  Home  Condition GUARDED    Consults called: None    Admission status: Admit to inpatient    Time spent in minutes : 3158   Coletta Memosrinity Odetta Forness M.D on 07/25/2019 at 11:49 PM  To page go to [email or web address] - password [email or web address]

## 2019-08-10 NOTE — ED Notes (Signed)
ED TO INPATIENT HANDOFF REPORT  ED Nurse Name and Phone #: Lattie Haw at [phone]  S Name/Age/Gender Aaron Blair 85 y.o. male Room/Bed: MH05/MH05  Code Status   Code Status: DNR  Home/SNF/Other Home Patient oriented to: self, place, time and situation Is this baseline? Yes   Triage Complete: Triage complete  Chief Complaint Acute hypoxemic respiratory failure due to severe acute respiratory syndrome coronavirus 2 (SARS-CoV-2) disease (HCC) [U07.1, J96.01]  Triage Note Fever, chills, body aches, headache, cough, sob, fatigue, loss off taste, loss of smell. No known Covid exposure.     Allergies Allergies  Allergen Reactions  . No Known Allergies   . Statins     Level of Care/Admitting Diagnosis ED Disposition    ED Disposition Condition [facility] Area: [place] [[id number]]  Level of Care: ICU [6]  Covid Evaluation: Confirmed COVID Positive  Diagnosis: Acute hypoxemic respiratory failure due to severe acute respiratory syndrome coronavirus 2 (SARS-CoV-2) disease [facility]) [[id number]]  Admitting Physician: Donne Anon  Attending Physician: Donne Anon  Estimated length of stay: 3 - 4 days  Certification:: I certify this patient will need inpatient services for at least 2 midnights       B Medical/Surgery History Past Medical History:  Diagnosis Date  . Chronic gastritis   . Diabetes type 2, uncontrolled ([place])   . Hyperlipidemia   . Hypertension   . Lumbar herniated disc, L3-4   . Vertigo    Past Surgical History:  Procedure Laterality Date  . BACK SURGERY    . COLONOSCOPY    . ENDARTERECTOMY Left 07/17/2016   Procedure: ENDARTERECTOMY CAROTID;  Surgeon: Elam Dutch, MD;  Location: [place];  Service: Vascular;  Laterality: Left;  . SPINE SURGERY  02/05/2016     A IV Location/Drains/Wounds Patient Lines/Drains/Airways Status   Active Line/Drains/Airways    Name:   Placement date:   Placement time:    Site:   Days:   Peripheral IV 07/28/2019 Left Hand   07/24/2019    1720    Hand   less than 1          Intake/Output Last 24 hours No intake or output data in the 24 hours ending 07/17/2019 2016  Labs/Imaging Results for orders placed or performed during the hospital encounter of 08/04/2019 (from the past 48 hour(s))  CBG monitoring, ED     Status: Abnormal   Collection Time: 08/12/2019  5:25 PM  Result Value Ref Range   Glucose-Capillary 327 (H) 70 - 99 mg/dL  Lactic acid, plasma     Status: Abnormal   Collection Time: 07/27/2019  5:40 PM  Result Value Ref Range   Lactic Acid, Venous 2.1 (HH) 0.5 - 1.9 mmol/L    Comment: CRITICAL RESULT CALLED TO, READ BACK BY AND VERIFIED WITH: RUTH Beverley Fiedler RN @1830  08/08/2019 OLSONM Performed at [facility], [address] [place], [place] [address]   CBC WITH DIFFERENTIAL     Status: Abnormal   Collection Time: 07/23/2019  5:40 PM  Result Value Ref Range   WBC 5.7 4.0 - 10.5 K/uL   RBC 3.92 (L) 4.22 - 5.81 MIL/uL   Hemoglobin 12.4 (L) 13.0 - 17.0 g/dL   HCT 37.1 (L) 39.0 - 52.0 %   MCV 94.6 80.0 - 100.0 fL   MCH 31.6 26.0 - 34.0 pg   MCHC 33.4 30.0 - 36.0 g/dL   RDW 12.6 11.5 - 15.5 %  Platelets 187 150 - 400 K/uL   nRBC 0.0 0.0 - 0.2 %   Neutrophils Relative % 86 %   Neutro Abs 4.9 1.7 - 7.7 K/uL   Lymphocytes Relative 8 %   Lymphs Abs 0.4 (L) 0.7 - 4.0 K/uL   Monocytes Relative 5 %   Monocytes Absolute 0.3 0.1 - 1.0 K/uL   Eosinophils Relative 0 %   Eosinophils Absolute 0.0 0.0 - 0.5 K/uL   Basophils Relative 0 %   Basophils Absolute 0.0 0.0 - 0.1 K/uL   Immature Granulocytes 1 %   Abs Immature Granulocytes 0.08 (H) 0.00 - 0.07 K/uL    Comment: Performed at [facility], [address] [place], [place] [address]  Comprehensive metabolic panel     Status: Abnormal   Collection Time: 07/17/2019  5:40 PM  Result Value Ref Range   Sodium 133 (L) 135 - 145 mmol/L   Potassium 3.5 3.5 - 5.1 mmol/L   Chloride 100 98 -  111 mmol/L   CO2 17 (L) 22 - 32 mmol/L   Glucose, Bld 333 (H) 70 - 99 mg/dL   BUN 22 8 - 23 mg/dL   Creatinine, Ser 1.21 0.61 - 1.24 mg/dL   Calcium 8.1 (L) 8.9 - 10.3 mg/dL   Total Protein 6.7 6.5 - 8.1 g/dL   Albumin 2.8 (L) 3.5 - 5.0 g/dL   AST 39 15 - 41 U/L   ALT 18 0 - 44 U/L   Alkaline Phosphatase 65 38 - 126 U/L   Total Bilirubin 1.1 0.3 - 1.2 mg/dL   GFR calc non Af Amer 55 (L) >60 mL/min   GFR calc Af Amer >60 >60 mL/min   Anion gap 16 (H) 5 - 15    Comment: Performed at [facility], [address] [place], [place] [address]  D-dimer, quantitative     Status: Abnormal   Collection Time: 07/25/2019  5:40 PM  Result Value Ref Range   D-Dimer, Quant 7.38 (H) 0.00 - 0.50 ug/mL-FEU    Comment: (NOTE) At the manufacturer cut-off of 0.50 ug/mL FEU, this assay has been documented to exclude PE with a sensitivity and negative predictive value of 97 to 99%.  At this time, this assay has not been approved by the FDA to exclude DVT/VTE. Results should be correlated with clinical presentation. Performed at [facility], [address] [place], [place] [address]   Brain natriuretic peptide     Status: Abnormal   Collection Time: 07/29/2019  5:40 PM  Result Value Ref Range   B Natriuretic Peptide 105.3 (H) 0.0 - 100.0 pg/mL    Comment: Performed at [facility], [address] [place], [place] [address]  Troponin I (High Sensitivity)     Status: Abnormal   Collection Time: 08/07/2019  5:40 PM  Result Value Ref Range   Troponin I (High Sensitivity) 286 (HH) <18 ng/L    Comment: CRITICAL RESULT CALLED TO, READ BACK BY AND VERIFIED WITH: KELLY NEEL RN @1903  07/13/2019 OLSONM (NOTE) Elevated high sensitivity troponin I (hsTnI) values and significant  changes across serial measurements may suggest ACS but many other  chronic and acute conditions are known to elevate hsTnI results.  Refer to the Links section for chest pain algorithms and additional   guidance. Performed at [facility], [address] [place], [place] [address]   SARS Coronavirus 2 Ag (30 min TAT) - Nasal Swab (  BD Veritor Kit)     Status: Abnormal   Collection Time: 08/09/2019  5:45 PM   Specimen: Nasal Swab (BD Veritor Kit)  Result Value Ref Range   SARS Coronavirus 2 Ag POSITIVE (A) NEGATIVE    Comment: RESULT CALLED TO, READ BACK BY AND VERIFIED WITH: Ilda Basset RN @2036  07/23/2019 OLSONM (NOTE) SARS-CoV-2 antigen PRESENT. Positive results indicate the presence of viral antigens, but clinical correlation with patient history and other diagnostic information is necessary to determine patient infection status.  Positive results do not rule out bacterial infection or co-infection  with other viruses. False positive results are rare but can occur, and confirmatory RT-PCR testing may be appropriate in some circumstances. The expected result is Negative. Fact Sheet for Patients: PodPark.tn Fact Sheet for Providers: GiftContent.is  This test is not yet approved or cleared by the [place] FDA and  has been authorized for detection and/or diagnosis of SARS-CoV-2 by FDA under an Emergency Use Authorization (EUA).  This EUA will remain in effect (meaning this test can be used) for the duration of  the COVID-19 decl aration under Section 564(b)(1) of the Act, 21 U.S.C. section 360bbb-3(b)(1), unless the authorization is terminated or revoked sooner. Performed at [facility], [address] [place], [place] [address]   I-STAT 7, (LYTES, BLD GAS, ICA, H+H)     Status: Abnormal   Collection Time: 07/24/2019  5:47 PM  Result Value Ref Range   pH, Arterial 7.414 7.350 - 7.450   pCO2 arterial 31.5 (L) 32.0 - 48.0 mmHg   pO2, Arterial 96.0 83.0 - 108.0 mmHg   Bicarbonate 19.8 (L) 20.0 - 28.0 mmol/L   TCO2 21 (L) 22 - 32 mmol/L   O2 Saturation 97.0 %   Acid-base deficit 3.0 (H) 0.0 -  2.0 mmol/L   Sodium 136 135 - 145 mmol/L   Potassium 3.5 3.5 - 5.1 mmol/L   Calcium, Ion 1.16 1.15 - 1.40 mmol/L   HCT 32.0 (L) 39.0 - 52.0 %   Hemoglobin 10.9 (L) 13.0 - 17.0 g/dL   Patient temperature 101.7 F    Collection site RADIAL, ALLEN'S TEST ACCEPTABLE    Drawn by RT    Sample type ARTERIAL    DG Chest Portable 1 View  Result Date: 07/29/2019 CLINICAL DATA:  Shortness of breath EXAM: PORTABLE CHEST 1 VIEW COMPARISON:  10/08/2017 FINDINGS: There is scattered pulmonary opacities bilaterally. The heart size is mildly enlarged. Aortic calcifications are noted. There is no pneumothorax. No large pleural effusion. No acute osseous abnormality. IMPRESSION: Scattered pulmonary opacities bilaterally can be seen in patients with an atypical infectious process versus pulmonary edema. Electronically Signed   By: Constance Holster M.D.   On: 07/30/2019 17:55    Pending Labs Unresulted Labs (From admission, onward)    Start     Ordered   07/19/2019 1730  Blood gas, arterial (at [facility])  ONCE - STAT,   STAT     07/20/2019 1729   08/04/2019 1729  Lactic acid, plasma  Now then every 2 hours,   STAT     07/18/2019 1729   08/05/2019 1729  Blood Culture (routine x 2)  BLOOD CULTURE X 2,   STAT     07/20/2019 1729   08/12/2019 1729  Procalcitonin  ONCE - STAT,   STAT     07/14/2019 1729   07/14/2019 1729  Lactate dehydrogenase  Once,   STAT     07/15/2019 1729   08/07/2019  1729  Ferritin  Once,   STAT     08/02/2019 1729   07/26/2019 1729  Triglycerides  Once,   STAT     07/31/2019 1729   07/29/2019 1729  Fibrinogen  Once,   STAT     07/29/2019 1729   08/09/2019 1729  C-reactive protein  Once,   STAT     08/08/2019 1729   Signed and Held  CBC with Differential/Platelet  Daily,   R     Signed and Held   Signed and Held  Comprehensive metabolic panel  Daily,   R     Signed and Held   Signed and Held  C-reactive protein  Daily,   R     Signed and Held   Signed and Held  D-dimer, quantitative (not at [facility])  Daily,   R      Signed and Held   Signed and Held  Ferritin  Daily,   R     Signed and Held          Vitals/Pain Today's Vitals   07/23/2019 1916 07/19/2019 1921 08/01/2019 1922 08/01/2019 2010  BP: (!) 159/95   (!) 148/91  Pulse: (!) 107 (!) 102  99  Resp: (!) 25 (!) 22  (!) 25  Temp:      TempSrc:      SpO2: 100% 100%  95%  Weight:      Height:      PainSc:   0-No pain     Isolation Precautions Airborne and Contact precautions  Medications Medications  azithromycin (ZITHROMAX) 500 mg in sodium chloride 0.9 % 250 mL IVPB (500 mg Intravenous New Bag/Given 07/13/2019 2010)  0.9 %  sodium chloride infusion (250 mLs Intravenous New Bag/Given 07/13/2019 1909)  iohexol (OMNIPAQUE) 350 MG/ML injection 100 mL (has no administration in time range)  enoxaparin (LOVENOX) injection 65 mg (has no administration in time range)  cefTRIAXone (ROCEPHIN) 1 g in sodium chloride 0.9 % 100 mL IVPB (0 g Intravenous Stopped 08/01/2019 1945)  dexamethasone (DECADRON) injection 10 mg (10 mg Intravenous Given 07/21/2019 1920)  acetaminophen (TYLENOL) tablet 1,000 mg (1,000 mg Oral Given 07/20/2019 1918)    Mobility walks Low fall risk   Focused Assessments Pulmonary Assessment Handoff:  Lung sounds:   O2 Device: Room Air O2 Flow Rate (L/min): (S) 10 L/min      R Recommendations: See Admitting Provider Note  Report given to:   Additional Notes: On high flow oxygen, IV #20 left hand, cardiac monitor on, isolation:airborn and contact

## 2019-08-10 NOTE — ED Notes (Signed)
Do not attempt resuscitation/DNR orders placed.

## 2019-08-10 NOTE — ED Notes (Signed)
Left Radial Arterial X1 attempt for ABG and Labs and Blood Culture #1. No complications noted. Bleeding stopped. Pt is on 100% NRB and 15LPM HFNC for ABG for approximately 15-20 mins.

## 2019-08-10 NOTE — Progress Notes (Signed)
Son, Aaron Blair, updated on patient status and plan of care. All questions answered at present time. Patient and son spoke on phone.

## 2019-08-10 NOTE — ED Notes (Signed)
Pt transferred to [facility] via [place]

## 2019-08-10 NOTE — Progress Notes (Signed)
Patient did not tolerate lying prone at this time.  Patient's son said that his father was uncomfortable lying prone.  Increased patient's hi flo back to 15 liters.  Patient's FIO2 increased to 95% after a few minutes.  Patient is currently on a 15 liter hi flo nasal nasal cannula and 100% non rebreather with a SPO2 of 95% with no WOB or accessory muscle use.

## 2019-08-11 ENCOUNTER — Inpatient Hospital Stay (HOSPITAL_COMMUNITY): Payer: Medicare Other

## 2019-08-11 ENCOUNTER — Telehealth: Payer: Self-pay | Admitting: *Deleted

## 2019-08-11 DIAGNOSIS — I10 Essential (primary) hypertension: Secondary | ICD-10-CM

## 2019-08-11 DIAGNOSIS — IMO0002 Reserved for concepts with insufficient information to code with codable children: Secondary | ICD-10-CM | POA: Diagnosis present

## 2019-08-11 DIAGNOSIS — J1289 Other viral pneumonia: Secondary | ICD-10-CM

## 2019-08-11 DIAGNOSIS — I248 Other forms of acute ischemic heart disease: Secondary | ICD-10-CM

## 2019-08-11 DIAGNOSIS — R7989 Other specified abnormal findings of blood chemistry: Secondary | ICD-10-CM | POA: Diagnosis present

## 2019-08-11 DIAGNOSIS — U071 COVID-19: Secondary | ICD-10-CM

## 2019-08-11 DIAGNOSIS — E1165 Type 2 diabetes mellitus with hyperglycemia: Secondary | ICD-10-CM | POA: Diagnosis present

## 2019-08-11 DIAGNOSIS — J80 Acute respiratory distress syndrome: Secondary | ICD-10-CM

## 2019-08-11 DIAGNOSIS — J9621 Acute and chronic respiratory failure with hypoxia: Secondary | ICD-10-CM

## 2019-08-11 DIAGNOSIS — J9601 Acute respiratory failure with hypoxia: Secondary | ICD-10-CM

## 2019-08-11 DIAGNOSIS — I361 Nonrheumatic tricuspid (valve) insufficiency: Secondary | ICD-10-CM

## 2019-08-11 DIAGNOSIS — R778 Other specified abnormalities of plasma proteins: Secondary | ICD-10-CM | POA: Diagnosis present

## 2019-08-11 DIAGNOSIS — J431 Panlobular emphysema: Secondary | ICD-10-CM

## 2019-08-11 DIAGNOSIS — E7800 Pure hypercholesterolemia, unspecified: Secondary | ICD-10-CM

## 2019-08-11 LAB — COMPREHENSIVE METABOLIC PANEL
ALT: 17 U/L (ref 0–44)
AST: 43 U/L — ABNORMAL HIGH (ref 15–41)
Albumin: 2.7 g/dL — ABNORMAL LOW (ref 3.5–5.0)
Alkaline Phosphatase: 66 U/L (ref 38–126)
Anion gap: 14 (ref 5–15)
BUN: 28 mg/dL — ABNORMAL HIGH (ref 8–23)
CO2: 20 mmol/L — ABNORMAL LOW (ref 22–32)
Calcium: 8.1 mg/dL — ABNORMAL LOW (ref 8.9–10.3)
Chloride: 103 mmol/L (ref 98–111)
Creatinine, Ser: 1.2 mg/dL (ref 0.61–1.24)
GFR calc Af Amer: >60 mL/min (ref >60)
GFR calc non Af Amer: 56 mL/min — ABNORMAL LOW (ref >60)
Glucose, Bld: 372 mg/dL — ABNORMAL HIGH (ref 70–99)
Potassium: 3.6 mmol/L (ref 3.5–5.1)
Sodium: 137 mmol/L (ref 135–145)
Total Bilirubin: 0.7 mg/dL (ref 0.3–1.2)
Total Protein: 6.5 g/dL (ref 6.5–8.1)

## 2019-08-11 LAB — CBC WITH DIFFERENTIAL/PLATELET
Abs Immature Granulocytes: 0.09 10*3/uL — ABNORMAL HIGH (ref 0.00–0.07)
Basophils Absolute: 0 10*3/uL (ref 0.0–0.1)
Basophils Relative: 0 %
Eosinophils Absolute: 0 10*3/uL (ref 0.0–0.5)
Eosinophils Relative: 0 %
HCT: 36.6 % — ABNORMAL LOW (ref 39.0–52.0)
Hemoglobin: 12.6 g/dL — ABNORMAL LOW (ref 13.0–17.0)
Immature Granulocytes: 2 %
Lymphocytes Relative: 13 %
Lymphs Abs: 0.6 10*3/uL — ABNORMAL LOW (ref 0.7–4.0)
MCH: 32.1 pg (ref 26.0–34.0)
MCHC: 34.4 g/dL (ref 30.0–36.0)
MCV: 93.4 fL (ref 80.0–100.0)
Monocytes Absolute: 0.2 10*3/uL (ref 0.1–1.0)
Monocytes Relative: 4 %
Neutro Abs: 3.8 10*3/uL (ref 1.7–7.7)
Neutrophils Relative %: 81 %
Platelets: 180 10*3/uL (ref 150–400)
RBC: 3.92 MIL/uL — ABNORMAL LOW (ref 4.22–5.81)
RDW: 12.4 % (ref 11.5–15.5)
WBC: 4.7 10*3/uL (ref 4.0–10.5)
nRBC: 0 % (ref 0.0–0.2)

## 2019-08-11 LAB — MRSA PCR SCREENING: MRSA by PCR: POSITIVE — AB

## 2019-08-11 LAB — GLUCOSE, CAPILLARY
Glucose-Capillary: 183 mg/dL — ABNORMAL HIGH (ref 70–99)
Glucose-Capillary: 238 mg/dL — ABNORMAL HIGH (ref 70–99)
Glucose-Capillary: 257 mg/dL — ABNORMAL HIGH (ref 70–99)
Glucose-Capillary: 316 mg/dL — ABNORMAL HIGH (ref 70–99)
Glucose-Capillary: 380 mg/dL — ABNORMAL HIGH (ref 70–99)

## 2019-08-11 LAB — ECHOCARDIOGRAM COMPLETE
Height: 63 in
Weight: 2240 oz

## 2019-08-11 LAB — C-REACTIVE PROTEIN: CRP: 16.1 mg/dL — ABNORMAL HIGH (ref <1.0)

## 2019-08-11 LAB — TROPONIN I (HIGH SENSITIVITY): Troponin I (High Sensitivity): 1548 ng/L (ref <18)

## 2019-08-11 LAB — HEMOGLOBIN A1C
Hgb A1c MFr Bld: 9.4 % — ABNORMAL HIGH (ref 4.8–5.6)
Mean Plasma Glucose: 223.08 mg/dL

## 2019-08-11 LAB — PHOSPHORUS: Phosphorus: 2.8 mg/dL (ref 2.5–4.6)

## 2019-08-11 LAB — FERRITIN: Ferritin: 368 ng/mL — ABNORMAL HIGH (ref 24–336)

## 2019-08-11 LAB — MAGNESIUM: Magnesium: 2 mg/dL (ref 1.7–2.4)

## 2019-08-11 LAB — D-DIMER, QUANTITATIVE: D-Dimer, Quant: 11.45 ug/mL-FEU — ABNORMAL HIGH (ref 0.00–0.50)

## 2019-08-11 MED ORDER — IPRATROPIUM-ALBUTEROL 20-100 MCG/ACT IN AERS
1.0000 | INHALATION_SPRAY | Freq: Four times a day (QID) | RESPIRATORY_TRACT | Status: DC
  Administered 2019-08-11 – 2019-08-26 (×61): 1 via RESPIRATORY_TRACT
  Filled 2019-08-11 (×2): qty 4

## 2019-08-11 MED ORDER — INSULIN GLARGINE 100 UNIT/ML ~~LOC~~ SOLN
10.0000 [IU] | Freq: Every day | SUBCUTANEOUS | Status: DC
  Administered 2019-08-11 – 2019-08-12 (×2): 10 [IU] via SUBCUTANEOUS
  Filled 2019-08-11 (×2): qty 0.1

## 2019-08-11 MED ORDER — AMLODIPINE BESYLATE 5 MG PO TABS: 5.0000 mg | ORAL_TABLET | Freq: Every day | ORAL | Status: DC

## 2019-08-11 MED ORDER — TOCILIZUMAB 400 MG/20ML IV SOLN
500.0000 mg | Freq: Once | INTRAVENOUS | Status: AC
  Administered 2019-08-11: 12:00:00 500 mg via INTRAVENOUS
  Filled 2019-08-11: qty 20

## 2019-08-11 MED ORDER — SODIUM CHLORIDE 0.9% FLUSH
10.0000 mL | INTRAVENOUS | Status: DC | PRN
  Administered 2019-08-11: 09:00:00 10 mL

## 2019-08-11 MED ORDER — LISINOPRIL 20 MG PO TABS
20.0000 mg | ORAL_TABLET | Freq: Every day | ORAL | Status: DC
  Administered 2019-08-11 – 2019-08-25 (×14): 20 mg via ORAL
  Filled 2019-08-11 (×16): qty 1

## 2019-08-11 MED ORDER — ASPIRIN EC 81 MG PO TBEC
81.0000 mg | DELAYED_RELEASE_TABLET | Freq: Every day | ORAL | Status: DC
  Administered 2019-08-11 – 2019-09-03 (×24): 81 mg via ORAL
  Filled 2019-08-11 (×26): qty 1

## 2019-08-11 MED ORDER — FUROSEMIDE 10 MG/ML IJ SOLN
40.0000 mg | Freq: Once | INTRAMUSCULAR | Status: AC
  Administered 2019-08-11: 40 mg via INTRAVENOUS
  Filled 2019-08-11: qty 4

## 2019-08-11 MED ORDER — CHLORHEXIDINE GLUCONATE CLOTH 2 % EX PADS
6.0000 | MEDICATED_PAD | Freq: Every day | CUTANEOUS | Status: DC
  Administered 2019-08-11 – 2019-09-03 (×22): 6 via TOPICAL

## 2019-08-11 MED ORDER — POTASSIUM CHLORIDE CRYS ER 20 MEQ PO TBCR
40.0000 meq | EXTENDED_RELEASE_TABLET | Freq: Once | ORAL | Status: AC
  Administered 2019-08-11: 40 meq via ORAL
  Filled 2019-08-11: qty 2

## 2019-08-11 MED ORDER — IOHEXOL 350 MG/ML SOLN
80.0000 mL | Freq: Once | INTRAVENOUS | Status: AC | PRN
  Administered 2019-08-11: 80 mL via INTRAVENOUS

## 2019-08-11 MED ORDER — LISINOPRIL 20 MG PO TABS: 20.0000 mg | ORAL_TABLET | Freq: Every day | ORAL | Status: DC

## 2019-08-11 MED ORDER — MUPIROCIN 2 % EX OINT
TOPICAL_OINTMENT | Freq: Two times a day (BID) | CUTANEOUS | Status: DC
  Administered 2019-08-24 – 2019-09-02 (×3): 1 via NASAL
  Filled 2019-08-11 (×4): qty 22

## 2019-08-11 MED ORDER — AMLODIPINE BESYLATE 5 MG PO TABS
5.0000 mg | ORAL_TABLET | Freq: Every day | ORAL | Status: DC
  Administered 2019-08-11 – 2019-09-03 (×22): 5 mg via ORAL
  Filled 2019-08-11 (×26): qty 1

## 2019-08-11 NOTE — Progress Notes (Addendum)
1900  Report from [facility].   1950 Interpreter used cindy [id number]  1955 PM medications given, shift assessment,, VS WDL. Pt 02 sat in the 29s with NRB. Pt placed on HFNC 15L with NRB , now 02 SAT 90%. Call bell in reach. BG 238. Bed alarmed.

## 2019-08-11 NOTE — Progress Notes (Signed)
Pt son updated and all questions answered.  

## 2019-08-11 NOTE — Telephone Encounter (Signed)
Wife said. Pt was positive covid test and admitted pt in hospital, asked if she and her son did the test, said will be check tomorrow. I will f/u with his wife.

## 2019-08-11 NOTE — Progress Notes (Signed)
  Echocardiogram 2D Echocardiogram has been performed.  Aaron Blair 08/11/2019, 3:38 PM

## 2019-08-11 NOTE — Progress Notes (Signed)
Patient belongings: [employer] sweatpants x1 Black boxer-breifs x1 Socks x1 Black shoes x1 White pants x1 Eyeglasses x1 (on patient)  All belongings at beside in patient bag.

## 2019-08-11 NOTE — Progress Notes (Signed)
Inpatient Diabetes Program Recommendations  AACE/ADA: New Consensus Statement on Inpatient Glycemic Control (2015)  Target Ranges:  Prepandial:   less than 140 mg/dL      Peak postprandial:   less than 180 mg/dL (1-2 hours)      Critically ill patients:  140 - 180 mg/dL   Lab Results  Component Value Date   GLUCAP 316 (H) 08/11/2019   HGBA1C 9.8 (H) 06/01/2019    Review of Glycemic Control  Diabetes history: type 2 Outpatient Diabetes medications: Amaryl 4 mg daily, Actos 45 mg daily, Precose 25 mg TID Current orders for Inpatient glycemic control: Novolog MODERATE correction scale TID & HS scale  Inpatient Diabetes Program Recommendations:   Noted that blood sugars have been greater than 300 mg/dl.  Recommend adding Lantus 10 units daily (63.5 kg X 0.15 units/kg=9.525 units) and continuing Novolog correction scale as ordered. Titrate dosages as needed.  Harvel Ricks RN BSN CDE Diabetes Coordinator Pager: [phone]  8am-5pm

## 2019-08-11 NOTE — Progress Notes (Signed)
PROGRESS NOTE    Aaron Blair  MRN:[id number] DOB: 03/09/1934 DOA: 07/15/2019 PCP: Wallace, Erica, DO   Brief Narrative:  (Korean speaker) 85 y.o.  Asian male, PMHx  diabetes type II uncontrolled with complication,  HTN, HLD   Presents as a transfer from [facility] for management of respiratory failure with hypoxia.  Patient does not speak English, history obtained from family over the phone.  Over the past week patient has had subjective fevers with chills, poor appetite and started having shortness of breath with lethargy today prompting the ER presentation.  In the ER he was found to have oxygen saturation in the 50s on room air, CXR consistent with viral pneumonia, SARS-CoV-2 positive.  Patient transferred for further management.  No reported nausea, vomiting, diarrhea.  He has intermittent cough which is nonproductive.  No reported chest pain, palpitations, orthopnea, PND, lower extremity edema.    Subjective: A/O x4, negative CP, negative abdominal pain, negative N/V/D.  Positive S OB.   Assessment & Plan:   Principal Problem:   Acute hypoxemic respiratory failure due to severe acute respiratory syndrome coronavirus 2 (SARS-CoV-2) disease (HCC) Active Problems:   Hypertension   Hyperlipidemia   Diabetes type 2, uncontrolled (HCC)  Covid pneumonia/acute respiratory failure with hypoxia COVID-19 Labs Recent Labs    07/18/2019 1740 07/30/2019 1900 08/11/19 0535  DDIMER 7.38*  --  11.45*  FERRITIN  --  456* 368*  LDH 409*  --   --   CRP  --  15.0* 16.1*    12/29 SARS coronavirus positive  -Decadron 6 mg Daily -Remdesivir per pharmacy protocol -Combivent QID  -vitamins per Covid protocol -Flutter valve -Incentive spirometry -12/30 Actemra x1 dose   Diabetes type 2 uncontrolled with complication -10/20 hemoglobin A1c= 9.8 -Lantus 10 units daily  -Moderate SSI  Chest pain/elevated troponin/demand ischemia -EKG pending -Eechocardiogram; not consistent with ACS/MI see  results below  Essential HTN -Amlodipine 5 mg daily -Lisinopril lisinopril 20 mg daily  Hypokalemia -Potassium goal> 4 -K-Dur 40 mEq   DVT prophylaxis: Lovenox Code Status: DNR Family Communication:  Disposition Plan: TBD   Consultants:    Procedures/Significant Events:  12/30 Echocardiogram Left Ventricle: EV= 65 to 70%. - moderately increased LVH Aortic Valve: Severely calcified AoV with focal calcification of the NCC. Restricted movement of the NCC.   I have personally reviewed and interpreted all radiology studies and my findings are as above.  VENTILATOR SETTINGS: NRB 12/30 FiO2; 100% SPO2 97%   Cultures   Antimicrobials: Anti-infectives (From admission, onward)   Start     Dose/Rate Stop   08/11/19 1000  remdesivir 100 mg in sodium chloride 0.9 % 100 mL IVPB     100 mg 200 mL/hr over 30 Minutes 08/15/19 0959   07/23/2019 2230  remdesivir 200 mg in sodium chloride 0.9% 250 mL IVPB     200 mg 580 mL/hr over 30 Minutes 07/25/2019 2251   07/18/2019 1745  cefTRIAXone (ROCEPHIN) 1 g in sodium chloride 0.9 % 100 mL IVPB     1 g 200 mL/hr over 30 Minutes 07/19/2019 1945   07/30/2019 1745  azithromycin (ZITHROMAX) 500 mg in sodium chloride 0.9 % 250 mL IVPB     500 mg 250 mL/hr over 60 Minutes 08/09/2019 2057       Devices    LINES / TUBES:      Continuous Infusions: . sodium chloride Stopped (07/20/2019 2259)  . remdesivir 100 mg in NS 100 mL       Objective:   Vitals:   08/11/19 0600 08/11/19 0615 08/11/19 0623 08/11/19 0707  BP: (!) 167/85   (!) 161/82  Pulse: 79 81 79 82  Resp: (!) 22 (!) 23 (!) 21 (!) 22  Temp:      TempSrc:      SpO2: 96% (!) 86% 94% (!) 89%  Weight:      Height:        Intake/Output Summary (Last 24 hours) at 08/11/2019 0814 Last data filed at 08/11/2019 0707 Gross per 24 hour  Intake 937.56 ml  Output 1600 ml  Net -662.44 ml   Filed Weights   07/15/2019 1711  Weight: 63.5 kg    Examination:  General: A/O x4, positive  acute respiratory distress Eyes: negative scleral hemorrhage, negative anisocoria, negative icterus ENT: Negative Runny nose, negative gingival bleeding, Neck:  Negative scars, masses, torticollis, lymphadenopathy, JVD Lungs: Clear to auscultation bilaterally without wheezes or crackles Cardiovascular: Regular rate and rhythm without murmur gallop or rub normal S1 and S2 Abdomen: negative abdominal pain, nondistended, positive soft, bowel sounds, no rebound, no ascites, no appreciable mass Extremities: No significant cyanosis, clubbing, or edema bilateral lower extremities Skin: Negative rashes, lesions, ulcers Psychiatric:  Negative depression, negative anxiety, negative fatigue, negative mania  Central nervous system:  Cranial nerves II through XII intact, tongue/uvula midline, all extremities muscle strength 5/5, sensation intact negative dysarthria, negative expressive aphasia, negative receptive aphasia.  .     Data Reviewed: Care during the described time interval was provided by me .  I have reviewed this patient's available data, including medical history, events of note, physical examination, and all test results as part of my evaluation.   CBC: Recent Labs  Lab 07/23/2019 1740 07/27/2019 1747 08/11/19 0535  WBC 5.7  --  4.7  NEUTROABS 4.9  --  3.8  HGB 12.4* 10.9* 12.6*  HCT 37.1* 32.0* 36.6*  MCV 94.6  --  93.4  PLT 187  --  180   Basic Metabolic Panel: Recent Labs  Lab 08/01/2019 1740 07/19/2019 1747 08/11/19 0535  NA 133* 136 137  K 3.5 3.5 3.6  CL 100  --  103  CO2 17*  --  20*  GLUCOSE 333*  --  372*  BUN 22  --  28*  CREATININE 1.21  --  1.20  CALCIUM 8.1*  --  8.1*   GFR: Estimated Creatinine Clearance: 37.5 mL/min (by C-G formula based on SCr of 1.2 mg/dL). Liver Function Tests: Recent Labs  Lab 07/16/2019 1740 08/11/19 0535  AST 39 43*  ALT 18 17  ALKPHOS 65 66  BILITOT 1.1 0.7  PROT 6.7 6.5  ALBUMIN 2.8* 2.7*   No results for input(s): LIPASE,  AMYLASE in the last 168 hours. No results for input(s): AMMONIA in the last 168 hours. Coagulation Profile: No results for input(s): INR, PROTIME in the last 168 hours. Cardiac Enzymes: No results for input(s): CKTOTAL, CKMB, CKMBINDEX, TROPONINI in the last 168 hours. BNP (last 3 results) No results for input(s): PROBNP in the last 8760 hours. HbA1C: No results for input(s): HGBA1C in the last 72 hours. CBG: Recent Labs  Lab 08/04/2019 1725 08/11/19 0020 08/11/19 0752  GLUCAP 327* 380* 316*   Lipid Profile: Recent Labs    08/04/2019 1740  TRIG 141   Thyroid Function Tests: No results for input(s): TSH, T4TOTAL, FREET4, T3FREE, THYROIDAB in the last 72 hours. Anemia Panel: Recent Labs    08/05/2019 1900  FERRITIN 456*   Urine analysis:    Component   Value Date/Time   COLORURINE YELLOW 06/01/2019 0853   APPEARANCEUR CLEAR 06/01/2019 0853   LABSPEC 1.020 06/01/2019 0853   PHURINE 6.5 06/01/2019 0853   GLUCOSEU 250 (A) 06/01/2019 0853   HGBUR NEGATIVE 06/01/2019 0853   BILIRUBINUR NEGATIVE 06/01/2019 0853   KETONESUR NEGATIVE 06/01/2019 0853   PROTEINUR 100 (A) 12/15/2016 1216   UROBILINOGEN 0.2 06/01/2019 0853   NITRITE NEGATIVE 06/01/2019 0853   LEUKOCYTESUR NEGATIVE 06/01/2019 0853   Sepsis Labs: @LABRCNTIP(procalcitonin:4,lacticidven:4)  ) Recent Results (from the past 240 hour(s))  Blood Culture (routine x 2)     Status: None (Preliminary result)   Collection Time: 07/15/2019  5:40 PM   Specimen: BLOOD  Result Value Ref Range Status   Specimen Description   Final    BLOOD BLOOD LEFT WRIST Performed at [facility] [place], [address] [place], [place] [address]    Special Requests   Final    BOTTLES DRAWN AEROBIC AND ANAEROBIC Blood Culture adequate volume Performed at [facility], [address] [place], [place] [address]    Culture PENDING  Incomplete   Report Status PENDING  Incomplete  SARS Coronavirus 2 Ag (30 min TAT) - Nasal Swab (BD  Veritor Kit)     Status: Abnormal   Collection Time: 07/18/2019  5:45 PM   Specimen: Nasal Swab (BD Veritor Kit)  Result Value Ref Range Status   SARS Coronavirus 2 Ag POSITIVE (A) NEGATIVE Final    Comment: RESULT CALLED TO, READ BACK BY AND VERIFIED WITH: RUTH MARCUM RN @2036 08/09/2019 OLSONM (NOTE) SARS-CoV-2 antigen PRESENT. Positive results indicate the presence of viral antigens, but clinical correlation with patient history and other diagnostic information is necessary to determine patient infection status.  Positive results do not rule out bacterial infection or co-infection  with other viruses. False positive results are rare but can occur, and confirmatory RT-PCR testing may be appropriate in some circumstances. The expected result is Negative. Fact Sheet for Patients: https://www.fda.gov/media/139754/download Fact Sheet for Providers: https://www.fda.gov/media/139753/download  This test is not yet approved or cleared by the [place] FDA and  has been authorized for detection and/or diagnosis of SARS-CoV-2 by FDA under an Emergency Use Authorization (EUA).  This EUA will remain in effect (meaning this test can be used) for the duration of  the COVID-19 decl aration under Section 564(b)(1) of the Act, 21 U.S.C. section 360bbb-3(b)(1), unless the authorization is terminated or revoked sooner. Performed at [facility] [place], [address] [place], [place] [address]   MRSA PCR Screening     Status: Abnormal   Collection Time: 07/28/2019 10:20 PM   Specimen: Nasal Mucosa; Nasopharyngeal  Result Value Ref Range Status   MRSA by PCR POSITIVE (A) NEGATIVE Final    Comment:        The GeneXpert MRSA Assay (FDA approved for NASAL specimens only), is one component of a comprehensive MRSA colonization surveillance program. It is not intended to diagnose MRSA infection nor to guide or monitor treatment for MRSA infections. RESULT CALLED TO, READ BACK BY AND VERIFIED  WITH: ASHLEY TOLLIVER @ 0609 ON 08/11/2019 C VARNER  Performed at [facility], [address] , [place] [address]          Radiology Studies: DG Chest Portable 1 View  Result Date: 08/04/2019 CLINICAL DATA:  Shortness of breath EXAM: PORTABLE CHEST 1 VIEW COMPARISON:  10/08/2017 FINDINGS: There is scattered pulmonary opacities bilaterally. The heart size is mildly enlarged. Aortic   calcifications are noted. There is no pneumothorax. No large pleural effusion. No acute osseous abnormality. IMPRESSION: Scattered pulmonary opacities bilaterally can be seen in patients with an atypical infectious process versus pulmonary edema. Electronically Signed   By: Christopher  Green M.D.   On: 08/08/2019 17:55        Scheduled Meds: . vitamin C  500 mg Oral Daily  . Chlorhexidine Gluconate Cloth  6 each Topical Daily  . dexamethasone  6 mg Oral Q24H  . docusate sodium  100 mg Oral Daily  . enoxaparin (LOVENOX) injection  1 mg/kg Subcutaneous Q12H  . insulin aspart  0-15 Units Subcutaneous TID WC  . insulin aspart  0-5 Units Subcutaneous QHS  . Ipratropium-Albuterol  1 puff Inhalation Q6H  . mupirocin ointment   Nasal BID  . zinc sulfate  220 mg Oral Daily   Continuous Infusions: . sodium chloride Stopped (08/07/2019 2259)  . remdesivir 100 mg in NS 100 mL       LOS: 1 day   The patient is critically ill with multiple organ systems failure and requires high complexity decision making for assessment and support, frequent evaluation and titration of therapies, application of advanced monitoring technologies and extensive interpretation of multiple databases. Critical Care Time devoted to patient care services described in this note  Time spent: 40 minutes     WOODS, CURTIS J, MD [facility] Pager [phone]  If 7PM-7AM, please contact night-coverage [email or web address] Password [email or web address] 08/11/2019, 8:14 AM      

## 2019-08-12 LAB — CBC WITH DIFFERENTIAL/PLATELET
Abs Immature Granulocytes: 0.27 10*3/uL — ABNORMAL HIGH (ref 0.00–0.07)
Basophils Absolute: 0 10*3/uL (ref 0.0–0.1)
Basophils Relative: 0 %
Eosinophils Absolute: 0 10*3/uL (ref 0.0–0.5)
Eosinophils Relative: 0 %
HCT: 34.8 % — ABNORMAL LOW (ref 39.0–52.0)
Hemoglobin: 11.7 g/dL — ABNORMAL LOW (ref 13.0–17.0)
Immature Granulocytes: 2 %
Lymphocytes Relative: 5 %
Lymphs Abs: 0.7 10*3/uL (ref 0.7–4.0)
MCH: 31.8 pg (ref 26.0–34.0)
MCHC: 33.6 g/dL (ref 30.0–36.0)
MCV: 94.6 fL (ref 80.0–100.0)
Monocytes Absolute: 0.4 10*3/uL (ref 0.1–1.0)
Monocytes Relative: 3 %
Neutro Abs: 13.2 10*3/uL — ABNORMAL HIGH (ref 1.7–7.7)
Neutrophils Relative %: 90 %
Platelets: 189 10*3/uL (ref 150–400)
RBC: 3.68 MIL/uL — ABNORMAL LOW (ref 4.22–5.81)
RDW: 12.4 % (ref 11.5–15.5)
WBC: 14.6 10*3/uL — ABNORMAL HIGH (ref 4.0–10.5)
nRBC: 0.1 % (ref 0.0–0.2)

## 2019-08-12 LAB — COMPREHENSIVE METABOLIC PANEL
ALT: 17 U/L (ref 0–44)
AST: 41 U/L (ref 15–41)
Albumin: 2.5 g/dL — ABNORMAL LOW (ref 3.5–5.0)
Alkaline Phosphatase: 76 U/L (ref 38–126)
Anion gap: 13 (ref 5–15)
BUN: 36 mg/dL — ABNORMAL HIGH (ref 8–23)
CO2: 21 mmol/L — ABNORMAL LOW (ref 22–32)
Calcium: 8.1 mg/dL — ABNORMAL LOW (ref 8.9–10.3)
Chloride: 103 mmol/L (ref 98–111)
Creatinine, Ser: 0.92 mg/dL (ref 0.61–1.24)
GFR calc Af Amer: >60 mL/min (ref >60)
GFR calc non Af Amer: >60 mL/min (ref >60)
Glucose, Bld: 227 mg/dL — ABNORMAL HIGH (ref 70–99)
Potassium: 3.9 mmol/L (ref 3.5–5.1)
Sodium: 137 mmol/L (ref 135–145)
Total Bilirubin: 1.2 mg/dL (ref 0.3–1.2)
Total Protein: 5.6 g/dL — ABNORMAL LOW (ref 6.5–8.1)

## 2019-08-12 LAB — GLUCOSE, CAPILLARY
Glucose-Capillary: 310 mg/dL — ABNORMAL HIGH (ref 70–99)
Glucose-Capillary: 370 mg/dL — ABNORMAL HIGH (ref 70–99)
Glucose-Capillary: 230 mg/dL — ABNORMAL HIGH (ref 70–99)

## 2019-08-12 LAB — PHOSPHORUS: Phosphorus: 2.6 mg/dL (ref 2.5–4.6)

## 2019-08-12 LAB — MAGNESIUM: Magnesium: 2 mg/dL (ref 1.7–2.4)

## 2019-08-12 LAB — D-DIMER, QUANTITATIVE: D-Dimer, Quant: 5.03 ug/mL-FEU — ABNORMAL HIGH (ref 0.00–0.50)

## 2019-08-12 LAB — FERRITIN: Ferritin: 416 ng/mL — ABNORMAL HIGH (ref 24–336)

## 2019-08-12 LAB — C-REACTIVE PROTEIN: CRP: 9.2 mg/dL — ABNORMAL HIGH (ref <1.0)

## 2019-08-12 MED ORDER — INSULIN GLARGINE 100 UNIT/ML ~~LOC~~ SOLN
15.0000 [IU] | Freq: Every day | SUBCUTANEOUS | Status: DC
  Administered 2019-08-13: 15 [IU] via SUBCUTANEOUS
  Filled 2019-08-12: qty 0.15

## 2019-08-12 MED ORDER — INSULIN ASPART 100 UNIT/ML ~~LOC~~ SOLN
8.0000 [IU] | Freq: Four times a day (QID) | SUBCUTANEOUS | Status: DC
  Administered 2019-08-12 – 2019-08-13 (×3): 8 [IU] via SUBCUTANEOUS

## 2019-08-12 MED ORDER — INSULIN ASPART 100 UNIT/ML ~~LOC~~ SOLN
0.0000 [IU] | SUBCUTANEOUS | Status: DC
  Administered 2019-08-12: 21:00:00 3 [IU] via SUBCUTANEOUS
  Administered 2019-08-13: 4 [IU] via SUBCUTANEOUS
  Administered 2019-08-13: 20 [IU] via SUBCUTANEOUS
  Administered 2019-08-13: 4 [IU] via SUBCUTANEOUS
  Administered 2019-08-13: 17:00:00 20 [IU] via SUBCUTANEOUS

## 2019-08-12 NOTE — Progress Notes (Signed)
Results for ABDULLAH, RIZZI (MRN [id number]) as of 08/12/2019 13:02  Ref. Range 08/11/2019 17:17 08/11/2019 20:03 08/12/2019 07:20 08/12/2019 11:43  Glucose-Capillary Latest Ref Range: 70 - 99 mg/dL 183 (H) 238 (H) 310 (H) 370 (H)  Noted that blood sugars have been greater than 300 mg/dl.  Recommend increasing Lantus to 10 units BID, increase Novolog correction scale to RESISTANT TID & HS if blood sugars continue to be elevated.   Harvel Ricks RN BSN CDE Diabetes Coordinator Pager: [phone]  8am-5pm

## 2019-08-12 NOTE — Progress Notes (Addendum)
PROGRESS NOTE    Aaron Blair  KNL:[id number] DOB: November 09, 1933 DOA: 08/12/2019 PCP: No primary care provider on file.   Brief Narrative:  ([place] speaker) 85 y.o.  Asian male, PMHx  diabetes type II uncontrolled with complication,  HTN, HLD   Presents as a transfer from [facility] for management of respiratory failure with hypoxia.  Patient does not speak [place], history obtained from family over the phone.  Over the past week patient has had subjective fevers with chills, poor appetite and started having shortness of breath with lethargy today prompting the ER presentation.  In the ER he was found to have oxygen saturation in the 50s on room air, CXR consistent with viral pneumonia, SARS-CoV-2 positive.  Patient transferred for further management.  No reported nausea, vomiting, diarrhea.  He has intermittent cough which is nonproductive.  No reported chest pain, palpitations, orthopnea, PND, lower extremity edema.    Subjective: 12/31 last 24 hours afebrile, negative CP, negative abdominal pain.  Positive S OB   Assessment & Plan:   Principal Problem:   Acute hypoxemic respiratory failure due to severe acute respiratory syndrome coronavirus 2 (SARS-CoV-2) disease (HCC) Active Problems:   Hypertension   Hyperlipidemia   Diabetes type 2, uncontrolled ([place])   Demand ischemia ([place])   Pneumonia due to COVID-19 virus   Acute respiratory failure with hypoxia ([place])   Uncontrolled type 2 diabetes mellitus with complication (HCC)   Elevated troponin   Essential hypertension  Covid pneumonia/acute respiratory failure with hypoxia COVID-19 Labs Recent Labs    07/25/2019 1740 07/19/2019 1900 08/11/19 0535 08/12/19 0055  DDIMER 7.38*  --  11.45* 5.03*  FERRITIN  --  456* 368* 416*  LDH 409*  --   --   --   CRP  --  15.0* 16.1* 9.2*    12/29 SARS coronavirus positive  -Decadron 6 mg Daily -Remdesivir per pharmacy protocol -Combivent QID  -vitamins per Covid protocol -Flutter  valve -Incentive spirometry -12/30 Actemra x1 dose  Diabetes type 2 uncontrolled with complication -79/02 hemoglobin A1c= 9.8 -12/31 increase Lantus 15 units daily  -12/31 NovoLog 8 units QID -12/31 increase resistant SSI   Elevated D-dimer -CTA PE protocol negative see results below -Bilateral lower extremity Doppler pending  Chest pain/elevated troponin/demand ischemia -EKG pending -Eechocardiogram; not consistent with ACS/MI see results below  Essential HTN -Amlodipine 5 mg daily -Lisinopril lisinopril 20 mg daily  Hypokalemia -Potassium goal> 4    DVT prophylaxis: Lovenox Code Status: DNR Family Communication: 12/31 spoke with Leigh Aurora (son) explained plan of care answered all questions Disposition Plan: TBD   Consultants:    Procedures/Significant Events:  12/30 Echocardiogram Left Ventricle: EV= 65 to 70%. - moderately increased LVH Aortic Valve: Severely calcified AoV with focal calcification of the NCC. Restricted movement of the [facility] 12/30 CTA chest PE protocol;.-Extensive ground-glass opacities in keeping with history of COVID-19 positivity. - Motion degraded study with no evidence of pulmonary embolism.   I have personally reviewed and interpreted all radiology studies and my findings are as above.  VENTILATOR SETTINGS: HFNC + NRB 12/31 Flow; 15 L/min SPO2 90%   Cultures   Antimicrobials: Anti-infectives (From admission, onward)   Start     Dose/Rate Stop   08/11/19 1000  remdesivir 100 mg in sodium chloride 0.9 % 100 mL IVPB     100 mg 200 mL/hr over 30 Minutes 08/15/19 0959   07/18/2019 2230  remdesivir 200 mg in sodium chloride 0.9% 250 mL IVPB     200  mg 580 mL/hr over 30 Minutes 07/30/2019 2251   07/16/2019 1745  cefTRIAXone (ROCEPHIN) 1 g in sodium chloride 0.9 % 100 mL IVPB     1 g 200 mL/hr over 30 Minutes 07/31/2019 1945   07/30/2019 1745  azithromycin (ZITHROMAX) 500 mg in sodium chloride 0.9 % 250 mL IVPB     500 mg 250 mL/hr over 60 Minutes  07/18/2019 2057       Devices    LINES / TUBES:      Continuous Infusions: . sodium chloride Stopped (07/21/2019 2259)  . remdesivir 100 mg in NS 100 mL 100 mg (08/12/19 0822)     Objective: Vitals:   08/12/19 0500 08/12/19 0600 08/12/19 0721 08/12/19 1144  BP: 125/67 (!) 141/70 139/64 (!) 132/53  Pulse: 68 73 87 81  Resp: _0 Temp:   97.6 F (36.4 C) 97.6 F (36.4 C)  TempSrc:   Axillary Axillary  SpO2: 96% 93% 93% 90%  Weight:      Height:        Intake/Output Summary (Last 24 hours) at 08/12/2019 1303 Last data filed at 08/12/2019 1145 Gross per 24 hour  Intake 434.35 ml  Output 550 ml  Net -115.65 ml   Filed Weights   07/23/2019 1711  Weight: 63.5 kg   Physical Exam:  General: A/O x4, positive  acute respiratory distress Eyes: negative scleral hemorrhage, negative anisocoria, negative icterus ENT: Negative Runny nose, negative gingival bleeding, Neck:  Negative scars, masses, torticollis, lymphadenopathy, JVD Lungs: Clear to auscultation bilaterally without wheezes or crackles Cardiovascular: Regular rate and rhythm without murmur gallop or rub normal S1 and S2 Abdomen: negative abdominal pain, nondistended, positive soft, bowel sounds, no rebound, no ascites, no appreciable mass Extremities: No significant cyanosis, clubbing, or edema bilateral lower extremities Skin: Negative rashes, lesions, ulcers Psychiatric:  Negative depression, negative anxiety, negative fatigue, negative mania  Central nervous system:  Cranial nerves II through XII intact, tongue/uvula midline, all extremities muscle strength 5/5, sensation intact throughout, negative dysarthria, negative expressive aphasia, negative receptive aphasia.  .     Data Reviewed: Care during the described time interval was provided by me .  I have reviewed this patient's available data, including medical history, events of note, physical examination, and all test results as part of my  evaluation.   CBC: Recent Labs  Lab 07/14/2019 1740 07/14/2019 1747 08/11/19 0535 08/12/19 0055  WBC 5.7  --  4.7 14.6*  NEUTROABS 4.9  --  3.8 13.2*  HGB 12.4* 10.9* 12.6* 11.7*  HCT 37.1* 32.0* 36.6* 34.8*  MCV 94.6  --  93.4 94.6  PLT 187  --  180 774   Basic Metabolic Panel: Recent Labs  Lab 08/02/2019 1740 08/09/2019 1747 08/11/19 0530 08/11/19 0535 08/12/19 0055  NA 133* 136  --  137 137  K 3.5 3.5  --  3.6 3.9  CL 100  --   --  103 103  CO2 17*  --   --  20* 21*  GLUCOSE 333*  --   --  372* 227*  BUN 22  --   --  28* 36*  CREATININE 1.21  --   --  1.20 0.92  CALCIUM 8.1*  --   --  8.1* 8.1*  MG  --   --  2.0  --  2.0  PHOS  --   --  2.8  --  2.6   GFR: Estimated Creatinine Clearance: 49 mL/min (by C-G formula based on  SCr of 0.92 mg/dL). Liver Function Tests: Recent Labs  Lab 08/03/2019 1740 08/11/19 0535 08/12/19 0055  AST 39 43* 41  ALT _0 ALKPHOS 65 66 76  BILITOT 1.1 0.7 1.2  PROT 6.7 6.5 5.6*  ALBUMIN 2.8* 2.7* 2.5*   No results for input(s): LIPASE, AMYLASE in the last 168 hours. No results for input(s): AMMONIA in the last 168 hours. Coagulation Profile: No results for input(s): INR, PROTIME in the last 168 hours. Cardiac Enzymes: No results for input(s): CKTOTAL, CKMB, CKMBINDEX, TROPONINI in the last 168 hours. BNP (last 3 results) No results for input(s): PROBNP in the last 8760 hours. HbA1C: Recent Labs    08/11/19 0535  HGBA1C 9.4*   CBG: Recent Labs  Lab 08/11/19 1202 08/11/19 1717 08/11/19 2003 08/12/19 0720 08/12/19 1143  GLUCAP 257* 183* 238* 310* 370*   Lipid Profile: Recent Labs    07/22/2019 1740  TRIG 141   Thyroid Function Tests: No results for input(s): TSH, T4TOTAL, FREET4, T3FREE, THYROIDAB in the last 72 hours. Anemia Panel: Recent Labs    08/11/19 0535 08/12/19 0055  FERRITIN 368* 416*   Urine analysis:    Component Value Date/Time   COLORURINE YELLOW 06/01/2019 [place]  06/01/2019 0853   LABSPEC 1.020 06/01/2019 0853   PHURINE 6.5 06/01/2019 0853   GLUCOSEU 250 (A) 06/01/2019 0853   HGBUR NEGATIVE 06/01/2019 [place] 06/01/2019 [place] 06/01/2019 0853   PROTEINUR 100 (A) 12/15/2016 1216   UROBILINOGEN 0.2 06/01/2019 0853   NITRITE NEGATIVE 06/01/2019 0853   LEUKOCYTESUR NEGATIVE 06/01/2019 0853   Sepsis Labs: _1 (procalcitonin:4,lacticidven:4)  ) Recent Results (from the past 240 hour(s))  Blood Culture (routine x 2)     Status: None (Preliminary result)   Collection Time: 07/18/2019  5:40 PM   Specimen: BLOOD  Result Value Ref Range Status   Specimen Description   Final    BLOOD BLOOD LEFT WRIST Performed at [facility], [address] [place], [place] [address]    Special Requests   Final    BOTTLES DRAWN AEROBIC AND ANAEROBIC Blood Culture adequate volume   Culture   Final    NO GROWTH 2 DAYS Performed at [facility], [place] [address], [place], [place] [address]    Report Status PENDING  Incomplete  SARS Coronavirus 2 Ag (30 min TAT) - Nasal Swab (BD Veritor Kit)     Status: Abnormal   Collection Time: 07/13/2019  5:45 PM   Specimen: Nasal Swab (BD Veritor Kit)  Result Value Ref Range Status   SARS Coronavirus 2 Ag POSITIVE (A) NEGATIVE Final    Comment: RESULT CALLED TO, READ BACK BY AND VERIFIED WITH: RUTH MARCUM RN _2  07/29/2019 OLSONM (NOTE) SARS-CoV-2 antigen PRESENT. Positive results indicate the presence of viral antigens, but clinical correlation with patient history and other diagnostic information is necessary to determine patient infection status.  Positive results do not rule out bacterial infection or co-infection  with other viruses. False positive results are rare but can occur, and confirmatory RT-PCR testing may be appropriate in some circumstances. The expected result is Negative. Fact Sheet for Patients: PodPark.tn Fact  Sheet for Providers: GiftContent.is  This test is not yet approved or cleared by the [place] FDA and  has been authorized for detection and/or diagnosis of SARS-CoV-2 by FDA under an Emergency Use Authorization (EUA).  This EUA will remain in effect (meaning this test  can be used) for the duration of  the COVID-19 decl aration under Section 564(b)(1) of the Act, 21 U.S.C. section 360bbb-3(b)(1), unless the authorization is terminated or revoked sooner. Performed at [facility], [address] [place], [place] [address]   MRSA PCR Screening     Status: Abnormal   Collection Time: 08/03/2019 10:20 PM   Specimen: Nasal Mucosa; Nasopharyngeal  Result Value Ref Range Status   MRSA by PCR POSITIVE (A) NEGATIVE Final    Comment:        The GeneXpert MRSA Assay (FDA approved for NASAL specimens only), is one component of a comprehensive MRSA colonization surveillance program. It is not intended to diagnose MRSA infection nor to guide or monitor treatment for MRSA infections. RESULT CALLED TO, READ BACK BY AND VERIFIED WITH: York Pellant @ 1583 ON 08/11/2019 C VARNER  Performed at [facility], [place] [address] [place], [place] [address]          Radiology Studies: CT Angio Chest PE W and/or Wo Contrast  Result Date: 08/11/2019 CLINICAL DATA:  Shortness of breath EXAM: CT ANGIOGRAPHY CHEST WITH CONTRAST TECHNIQUE: Multidetector CT imaging of the chest was performed using the standard protocol during bolus administration of intravenous contrast. Multiplanar CT image reconstructions and MIPs were obtained to evaluate the vascular anatomy. CONTRAST:  64m OMNIPAQUE IOHEXOL 350 MG/ML SOLN COMPARISON:  None. FINDINGS: Cardiovascular: Normal heart size. No pericardial effusion. Significant motion artifact at the bases. No evidence of pulmonary artery filling defect. Atherosclerotic calcification of the aorta and coronaries.  Mediastinum/Nodes: Negative for adenopathy or mass Lungs/Pleura: Hazy ground-glass opacity throughout all lobes in keeping with history of COVID-19 positivity. No edema or effusion. There is centrilobular and panlobular apical emphysema. Upper Abdomen: Negative Musculoskeletal: Spinal degeneration. No acute or aggressive finding Review of the MIP images confirms the above findings. IMPRESSION: 1. Extensive ground-glass opacities in keeping with history of COVID-19 positivity. 2. Motion degraded study with no evidence of pulmonary embolism. 3. Aortic Atherosclerosis (ICD10-I70.0) and Emphysema (ICD10-J43.9). Electronically Signed   By: JMonte FantasiaM.D.   On: 08/11/2019 10:10   DG Chest Portable 1 View  Result Date: 08/09/2019 CLINICAL DATA:  Shortness of breath EXAM: PORTABLE CHEST 1 VIEW COMPARISON:  10/08/2017 FINDINGS: There is scattered pulmonary opacities bilaterally. The heart size is mildly enlarged. Aortic calcifications are noted. There is no pneumothorax. No large pleural effusion. No acute osseous abnormality. IMPRESSION: Scattered pulmonary opacities bilaterally can be seen in patients with an atypical infectious process versus pulmonary edema. Electronically Signed   By: CConstance HolsterM.D.   On: 08/08/2019 17:55   ECHOCARDIOGRAM COMPLETE  Result Date: 08/11/2019   ECHOCARDIOGRAM REPORT   Patient Name:   MYOHANN CURLSHIM Date of Exam: 08/11/2019 Medical Rec #:  [id number]   Height:       63.0 in Accession #:    [id number]  Weight:       140.0 lb Date of Birth:  5May 16, 1935   BSA:          1.66 m Patient Age:    89years     BP:           130/90 mmHg Patient Gender: M            HR:           87 bpm. Exam Location:  Inpatient Procedure: 2D Echo, Cardiac Doppler and Color Doppler Indications:    R07.9* Chest pain, unspecified  History:        Patient has no prior history of Echocardiogram examinations.                 Signs/Symptoms:Dyspnea; Risk Factors:Hypertension, Diabetes,                  Dyslipidemia and Former Smoker. Covid 19 positive. Respiratory                 failure. Hypoxia.  Sonographer:    Roseanna Rainbow RDCS Referring Phys: [id number] [place]  1. Left ventricular ejection fraction, by visual estimation, is 65 to 70%. The left ventricle has normal function. There is moderately increased left ventricular hypertrophy.  2. Left ventricular diastolic parameters are consistent with Grade I diastolic dysfunction (impaired relaxation).  3. The left ventricle has no regional wall motion abnormalities.  4. Global right ventricle has normal systolic function.The right ventricular size is mildly enlarged. No increase in right ventricular wall thickness.  5. Left atrial size was normal.  6. Right atrial size was normal.  7. Presence of pericardial fat pad.  8. Trivial pericardial effusion is present.  9. Mild mitral annular calcification. 10. The mitral valve is degenerative. Trivial mitral valve regurgitation. 11. The tricuspid valve is grossly normal. 12. The aortic valve is tricuspid. Aortic valve regurgitation is not visualized. Mild to moderate aortic valve sclerosis/calcification without any evidence of aortic stenosis. 13. There is severe calcifcation of the aortic valve. 14. Severely calcified AoV with focal calcification of the [place]. Restricted movement of the [place]. 15. The pulmonic valve was grossly normal. Pulmonic valve regurgitation is not visualized. 16. Normal pulmonary artery systolic pressure. 17. The tricuspid regurgitant velocity is 2.53 m/s, and with an assumed right atrial pressure of 3 mmHg, the estimated right ventricular systolic pressure is normal at 28.5 mmHg. 18. The inferior vena cava is normal in size with greater than 50% respiratory variability, suggesting right atrial pressure of 3 mmHg. 19. No prior Echocardiogram. FINDINGS  Left Ventricle: Left ventricular ejection fraction, by visual estimation, is 65 to 70%. The left ventricle has normal function. The  left ventricle has no regional wall motion abnormalities. There is moderately increased left ventricular hypertrophy. Concentric left ventricular hypertrophy. Left ventricular diastolic parameters are consistent with Grade I diastolic dysfunction (impaired relaxation). Normal left atrial pressure. Right Ventricle: The right ventricular size is mildly enlarged. No increase in right ventricular wall thickness. Global RV systolic function is has normal systolic function. The tricuspid regurgitant velocity is 2.53 m/s, and with an assumed right atrial  pressure of 3 mmHg, the estimated right ventricular systolic pressure is normal at 28.5 mmHg. Left Atrium: Left atrial size was normal in size. Right Atrium: Right atrial size was normal in size Pericardium: Trivial pericardial effusion is present. Presence of pericardial fat pad. Mitral Valve: The mitral valve is degenerative in appearance. Mild mitral annular calcification. Trivial mitral valve regurgitation. Tricuspid Valve: The tricuspid valve is grossly normal. Tricuspid valve regurgitation is mild. Aortic Valve: The aortic valve is tricuspid. . There is mild thickening and severe calcifcation of the aortic valve. Aortic valve regurgitation is not visualized. Mild to moderate aortic valve sclerosis/calcification is present, without any evidence of aortic stenosis. There is mild thickening of the aortic valve. There is severe calcifcation of the aortic valve. Aortic valve mean gradient measures 7.0 mmHg. Aortic valve peak gradient measures 12.7 mmHg. Aortic valve area, by VTI measures 1.19 cm. Severely calcified AoV with focal calcification of the [place]. Restricted  movement of the [place]. Pulmonic Valve: The pulmonic valve was grossly normal. Pulmonic valve regurgitation is not visualized. Pulmonic regurgitation is not visualized. Aorta: The aortic root and ascending aorta are structurally normal, with no evidence of dilitation. Venous: The inferior vena cava is normal in  size with greater than 50% respiratory variability, suggesting right atrial pressure of 3 mmHg. IAS/Shunts: No atrial level shunt detected by color flow Doppler.  LEFT VENTRICLE PLAX 2D LVIDd:         3.68 cm       Diastology LVIDs:         2.35 cm       LV e' lateral:   6.31 cm/s LV PW:         1.74 cm       LV E/e' lateral: 11.9 LV IVS:        1.41 cm       LV e' medial:    5.77 cm/s LVOT diam:     1.70 cm       LV E/e' medial:  13.0 LV SV:         38 ml LV SV Index:   22.62 LVOT Area:     2.27 cm  LV Volumes (MOD) LV area d, A2C:    23.30 cm LV area d, A4C:    22.20 cm LV area s, A2C:    12.30 cm LV area s, A4C:    12.45 cm LV major d, A2C:   7.78 cm LV major d, A4C:   6.09 cm LV major s, A2C:   6.92 cm LV major s, A4C:   5.66 cm LV vol d, MOD A2C: 60.2 ml LV vol d, MOD A4C: 66.2 ml LV vol s, MOD A2C: 19.2 ml LV vol s, MOD A4C: 23.3 ml LV SV MOD A2C:     41.0 ml LV SV MOD A4C:     66.2 ml LV SV MOD BP:      47.8 ml RIGHT VENTRICLE             IVC RV S prime:     11.90 cm/s  IVC diam: 1.63 cm TAPSE (M-mode): 1.3 cm LEFT ATRIUM           Index       RIGHT ATRIUM           Index LA diam:      3.00 cm 1.81 cm/m  RA Area:     11.20 cm LA Vol (A2C): 37.8 ml 22.75 ml/m RA Volume:   20.20 ml  12.16 ml/m LA Vol (A4C): 21.1 ml 12.70 ml/m  AORTIC VALVE AV Area (Vmax):    1.30 cm AV Area (Vmean):   1.25 cm AV Area (VTI):     1.19 cm AV Vmax:           178.39 cm/s AV Vmean:          122.100 cm/s AV VTI:            0.386 m AV Peak Grad:      12.7 mmHg AV Mean Grad:      7.0 mmHg LVOT Vmax:         102.00 cm/s LVOT Vmean:        67.100 cm/s LVOT VTI:          0.202 m LVOT/AV VTI ratio: 0.52  AORTA Ao Root diam: 3.10 cm Ao Asc diam:  3.30 cm MITRAL VALVE  TRICUSPID VALVE MV Area (PHT): 3.53 cm              TR Peak grad:   25.5 mmHg MV PHT:        62.35 msec            TR Vmax:        260.00 cm/s MV Decel Time: 215 msec MV E velocity: 75.20 cm/s  103 cm/s  SHUNTS MV A velocity: 102.30 cm/s  70.3 cm/s Systemic VTI:  0.20 m MV E/A ratio:  0.74        1.5       Systemic Diam: 1.70 cm  Eleonore Chiquito MD Electronically signed by Eleonore Chiquito MD Signature Date/Time: 08/11/2019/3:51:25 PM    Final         Scheduled Meds: . amLODipine  5 mg Oral Daily  . vitamin C  500 mg Oral Daily  . aspirin EC  81 mg Oral Daily  . Chlorhexidine Gluconate Cloth  6 each Topical Daily  . dexamethasone  6 mg Oral Q24H  . docusate sodium  100 mg Oral Daily  . enoxaparin (LOVENOX) injection  1 mg/kg Subcutaneous Q12H  . insulin aspart  0-15 Units Subcutaneous TID WC  . insulin aspart  0-5 Units Subcutaneous QHS  . insulin glargine  10 Units Subcutaneous Daily  . Ipratropium-Albuterol  1 puff Inhalation QID  . lisinopril  20 mg Oral Daily  . mupirocin ointment   Nasal BID  . zinc sulfate  220 mg Oral Daily   Continuous Infusions: . sodium chloride Stopped (07/20/2019 2259)  . remdesivir 100 mg in NS 100 mL 100 mg (08/12/19 0822)     LOS: 2 days   The patient is critically ill with multiple organ systems failure and requires high complexity decision making for assessment and support, frequent evaluation and titration of therapies, application of advanced monitoring technologies and extensive interpretation of multiple databases. Critical Care Time devoted to patient care services described in this note  Time spent: 40 minutes     Siniya Lichty, Geraldo Docker, MD [facility] Pager [phone]  If 7PM-7AM, please contact night-coverage [email or web address] Password [email or web address] 08/12/2019, 1:03 PM

## 2019-08-13 DIAGNOSIS — J1282 Pneumonia due to coronavirus disease 2019: Secondary | ICD-10-CM

## 2019-08-13 DIAGNOSIS — R7989 Other specified abnormal findings of blood chemistry: Secondary | ICD-10-CM

## 2019-08-13 DIAGNOSIS — R778 Other specified abnormalities of plasma proteins: Secondary | ICD-10-CM

## 2019-08-13 DIAGNOSIS — I248 Other forms of acute ischemic heart disease: Secondary | ICD-10-CM

## 2019-08-13 DIAGNOSIS — E7800 Pure hypercholesterolemia, unspecified: Secondary | ICD-10-CM

## 2019-08-13 DIAGNOSIS — J431 Panlobular emphysema: Secondary | ICD-10-CM

## 2019-08-13 LAB — MAGNESIUM: Magnesium: 2.2 mg/dL (ref 1.7–2.4)

## 2019-08-13 LAB — CBC WITH DIFFERENTIAL/PLATELET
Abs Immature Granulocytes: 0.24 10*3/uL — ABNORMAL HIGH (ref 0.00–0.07)
Basophils Absolute: 0 10*3/uL (ref 0.0–0.1)
Basophils Relative: 0 %
Eosinophils Absolute: 0 10*3/uL (ref 0.0–0.5)
Eosinophils Relative: 0 %
HCT: 33.5 % — ABNORMAL LOW (ref 39.0–52.0)
Hemoglobin: 11.5 g/dL — ABNORMAL LOW (ref 13.0–17.0)
Immature Granulocytes: 1 %
Lymphocytes Relative: 4 %
Lymphs Abs: 0.7 10*3/uL (ref 0.7–4.0)
MCH: 32 pg (ref 26.0–34.0)
MCHC: 34.3 g/dL (ref 30.0–36.0)
MCV: 93.3 fL (ref 80.0–100.0)
Monocytes Absolute: 0.3 10*3/uL (ref 0.1–1.0)
Monocytes Relative: 2 %
Neutro Abs: 15.4 10*3/uL — ABNORMAL HIGH (ref 1.7–7.7)
Neutrophils Relative %: 93 %
Platelets: 213 10*3/uL (ref 150–400)
RBC: 3.59 MIL/uL — ABNORMAL LOW (ref 4.22–5.81)
RDW: 12.4 % (ref 11.5–15.5)
WBC: 16.6 10*3/uL — ABNORMAL HIGH (ref 4.0–10.5)
nRBC: 0.2 % (ref 0.0–0.2)

## 2019-08-13 LAB — BASIC METABOLIC PANEL
Anion gap: 10 (ref 5–15)
BUN: 41 mg/dL — ABNORMAL HIGH (ref 8–23)
CO2: 22 mmol/L (ref 22–32)
Calcium: 8.3 mg/dL — ABNORMAL LOW (ref 8.9–10.3)
Chloride: 106 mmol/L (ref 98–111)
Creatinine, Ser: 0.92 mg/dL (ref 0.61–1.24)
GFR calc Af Amer: >60 mL/min (ref >60)
GFR calc non Af Amer: >60 mL/min (ref >60)
Glucose, Bld: 133 mg/dL — ABNORMAL HIGH (ref 70–99)
Potassium: 3.6 mmol/L (ref 3.5–5.1)
Sodium: 138 mmol/L (ref 135–145)

## 2019-08-13 LAB — COMPREHENSIVE METABOLIC PANEL
ALT: 18 U/L (ref 0–44)
AST: 32 U/L (ref 15–41)
Albumin: 2.3 g/dL — ABNORMAL LOW (ref 3.5–5.0)
Alkaline Phosphatase: 93 U/L (ref 38–126)
Anion gap: 8 (ref 5–15)
BUN: 42 mg/dL — ABNORMAL HIGH (ref 8–23)
CO2: 25 mmol/L (ref 22–32)
Calcium: 8.3 mg/dL — ABNORMAL LOW (ref 8.9–10.3)
Chloride: 106 mmol/L (ref 98–111)
Creatinine, Ser: 1 mg/dL (ref 0.61–1.24)
GFR calc Af Amer: >60 mL/min (ref >60)
GFR calc non Af Amer: >60 mL/min (ref >60)
Glucose, Bld: 81 mg/dL (ref 70–99)
Potassium: 3.6 mmol/L (ref 3.5–5.1)
Sodium: 139 mmol/L (ref 135–145)
Total Bilirubin: 0.6 mg/dL (ref 0.3–1.2)
Total Protein: 5.4 g/dL — ABNORMAL LOW (ref 6.5–8.1)

## 2019-08-13 LAB — D-DIMER, QUANTITATIVE: D-Dimer, Quant: 3.45 ug/mL-FEU — ABNORMAL HIGH (ref 0.00–0.50)

## 2019-08-13 LAB — GLUCOSE, CAPILLARY
Glucose-Capillary: 108 mg/dL — ABNORMAL HIGH (ref 70–99)
Glucose-Capillary: 128 mg/dL — ABNORMAL HIGH (ref 70–99)
Glucose-Capillary: 168 mg/dL — ABNORMAL HIGH (ref 70–99)
Glucose-Capillary: 188 mg/dL — ABNORMAL HIGH (ref 70–99)
Glucose-Capillary: 200 mg/dL — ABNORMAL HIGH (ref 70–99)
Glucose-Capillary: 275 mg/dL — ABNORMAL HIGH (ref 70–99)
Glucose-Capillary: 363 mg/dL — ABNORMAL HIGH (ref 70–99)
Glucose-Capillary: 387 mg/dL — ABNORMAL HIGH (ref 70–99)
Glucose-Capillary: 422 mg/dL — ABNORMAL HIGH (ref 70–99)
Glucose-Capillary: 574 mg/dL (ref 70–99)

## 2019-08-13 LAB — PHOSPHORUS: Phosphorus: 2.5 mg/dL (ref 2.5–4.6)

## 2019-08-13 LAB — FERRITIN: Ferritin: 396 ng/mL — ABNORMAL HIGH (ref 24–336)

## 2019-08-13 LAB — C-REACTIVE PROTEIN: CRP: 3.8 mg/dL — ABNORMAL HIGH (ref <1.0)

## 2019-08-13 MED ORDER — DEXTROSE-NACL 5-0.45 % IV SOLN: INTRAVENOUS | Status: DC

## 2019-08-13 MED ORDER — SODIUM CHLORIDE 0.9 % IV SOLN: INTRAVENOUS | Status: DC

## 2019-08-13 MED ORDER — POTASSIUM CHLORIDE IN NACL 20-0.45 MEQ/L-% IV SOLN
INTRAVENOUS | Status: DC
  Filled 2019-08-13 (×2): qty 1000

## 2019-08-13 MED ORDER — INSULIN REGULAR(HUMAN) IN NACL 100-0.9 UT/100ML-% IV SOLN
INTRAVENOUS | Status: DC
  Administered 2019-08-13: 1 [IU]/h via INTRAVENOUS
  Filled 2019-08-13: qty 100

## 2019-08-13 MED ORDER — DEXTROSE 50 % IV SOLN
0.0000 mL | INTRAVENOUS | Status: DC | PRN
  Filled 2019-08-13: qty 50

## 2019-08-13 MED ORDER — POTASSIUM CHLORIDE CRYS ER 20 MEQ PO TBCR
20.0000 meq | EXTENDED_RELEASE_TABLET | Freq: Once | ORAL | Status: AC
  Administered 2019-08-13: 20 meq via ORAL
  Filled 2019-08-13: qty 1

## 2019-08-13 NOTE — Progress Notes (Signed)
PROGRESS NOTE    Aaron Blair  TIR:[id number] DOB: 01/15/1934 DOA: 08/06/2019 PCP: No primary care provider on file.   Brief Narrative:  ([place] speaker) 86 y.o.  Asian male, PMHx  diabetes type II uncontrolled with complication,  HTN, HLD   Presents as a transfer from [facility] for management of respiratory failure with hypoxia.  Patient does not speak [place], history obtained from family over the phone.  Over the past week patient has had subjective fevers with chills, poor appetite and started having shortness of breath with lethargy today prompting the ER presentation.  In the ER he was found to have oxygen saturation in the 50s on room air, CXR consistent with viral pneumonia, SARS-CoV-2 positive.  Patient transferred for further management.  No reported nausea, vomiting, diarrhea.  He has intermittent cough which is nonproductive.  No reported chest pain, palpitations, orthopnea, PND, lower extremity edema.    Subjective: 1/1 A/O x4, patient's SPO2 initially low on exam (was on phone arguing with family).  Negative abdominal pain, positive S OB but improved   Assessment & Plan:   Principal Problem:   Acute hypoxemic respiratory failure due to severe acute respiratory syndrome coronavirus 2 (SARS-CoV-2) disease (HCC) Active Problems:   Hypertension   Hyperlipidemia   Diabetes type 2, uncontrolled ([place])   Demand ischemia ([place])   Pneumonia due to COVID-19 virus   Acute respiratory failure with hypoxia ([place])   Uncontrolled type 2 diabetes mellitus with complication (HCC)   Elevated troponin   Essential hypertension  Covid pneumonia/acute respiratory failure with hypoxia COVID-19 Labs Recent Labs    07/23/2019 1740 08/11/19 0535 08/12/19 0055 08/13/19 0100  DDIMER 7.38* 11.45* 5.03* 3.45*  FERRITIN  --  368* 416* 396*  LDH 409*  --   --   --   CRP  --  16.1* 9.2* 3.8*    12/29 SARS coronavirus positive  -Decadron 6 mg Daily -Remdesivir per pharmacy protocol -Combivent  QID  -vitamins per Covid protocol -Flutter valve -Incentive spirometry -12/30 Actemra x1 dose  Diabetes type 2 uncontrolled with complication -67/61 hemoglobin A1c= 9.8 -12/31 increase Lantus 15 units daily (hold) -12/31 NovoLog 8 units QID (hold) -12/31 increase resistant SSI (hold) -1/1 Endo tool until patient's CBG back under control  Elevated D-dimer -CTA PE protocol negative see results below -Bilateral lower extremity Doppler pending  Chest pain/elevated troponin/demand ischemia -EKG pending -Eechocardiogram; not consistent with ACS/MI see results below  Essential HTN -Amlodipine 5 mg daily -Lisinopril lisinopril 20 mg daily  Hypokalemia -Potassium goal> 4    DVT prophylaxis: Lovenox Code Status: DNR Family Communication: 12/31 spoke with Leigh Aurora (son) explained plan of care answered all questions Disposition Plan: TBD   Consultants:    Procedures/Significant Events:  12/30 Echocardiogram Left Ventricle: EV= 65 to 70%. - moderately increased LVH Aortic Valve: Severely calcified AoV with focal calcification of the NCC. Restricted movement of the [facility] 12/30 CTA chest PE protocol;.-Extensive ground-glass opacities in keeping with history of COVID-19 positivity. - Motion degraded study with no evidence of pulmonary embolism.   I have personally reviewed and interpreted all radiology studies and my findings are as above.  VENTILATOR SETTINGS: HFNC  1/1 Flow; 15 L/min SPO2 90%   Cultures   Antimicrobials: Anti-infectives (From admission, onward)   Start     Dose/Rate Stop   08/11/19 1000  remdesivir 100 mg in sodium chloride 0.9 % 100 mL IVPB     100 mg 200 mL/hr over 30 Minutes 08/15/19 0959   08/05/2019  2230  remdesivir 200 mg in sodium chloride 0.9% 250 mL IVPB     200 mg 580 mL/hr over 30 Minutes 08/05/2019 2251   07/17/2019 1745  cefTRIAXone (ROCEPHIN) 1 g in sodium chloride 0.9 % 100 mL IVPB     1 g 200 mL/hr over 30 Minutes 07/24/2019 1945   07/13/2019  1745  azithromycin (ZITHROMAX) 500 mg in sodium chloride 0.9 % 250 mL IVPB     500 mg 250 mL/hr over 60 Minutes 07/16/2019 2057       Devices    LINES / TUBES:      Continuous Infusions:  sodium chloride Stopped (07/20/2019 2259)   remdesivir 100 mg in NS 100 mL Stopped (08/12/19 0922)     Objective: Vitals:   08/13/19 0000 08/13/19 0004 08/13/19 0445 08/13/19 0723  BP: 128/68  115/69 129/67  Pulse: 71  64 79  Resp: 17  18 17   Temp:  98.3 F (36.8 C) 97.9 F (36.6 C) 98 F (36.7 C)  TempSrc:  Axillary Axillary Oral  SpO2: 97%  91% 95%  Weight:      Height:        Intake/Output Summary (Last 24 hours) at 08/13/2019 9563 Last data filed at 08/13/2019 0725 Gross per 24 hour  Intake 480 ml  Output 800 ml  Net -320 ml   Filed Weights   07/22/2019 1711  Weight: 63.5 kg    Physical Exam:  General: A/O x4, positive acute respiratory distress Eyes: negative scleral hemorrhage, negative anisocoria, negative icterus ENT: Negative Runny nose, negative gingival bleeding, Neck:  Negative scars, masses, torticollis, lymphadenopathy, JVD Lungs: Tachypneic, clear to auscultation bilaterally without wheezes or crackles Cardiovascular: Regular rate and rhythm without murmur gallop or rub normal S1 and S2 Abdomen: negative abdominal pain, nondistended, positive soft, bowel sounds, no rebound, no ascites, no appreciable mass Extremities: No significant cyanosis, clubbing, or edema bilateral lower extremities Skin: Negative rashes, lesions, ulcers Psychiatric:  Negative depression, negative anxiety, negative fatigue, negative mania  Central nervous system:  Cranial nerves II through XII intact, tongue/uvula midline, all extremities muscle strength 5/5, sensation intact throughout,  negative dysarthria, negative expressive aphasia, negative receptive aphasia.   .     Data Reviewed: Care during the described time interval was provided by me .  I have reviewed this patient's  available data, including medical history, events of note, physical examination, and all test results as part of my evaluation.   CBC: Recent Labs  Lab 07/28/2019 1740 07/17/2019 1747 08/11/19 0535 08/12/19 0055 08/13/19 0100  WBC 5.7  --  4.7 14.6* 16.6*  NEUTROABS 4.9  --  3.8 13.2* 15.4*  HGB 12.4* 10.9* 12.6* 11.7* 11.5*  HCT 37.1* 32.0* 36.6* 34.8* 33.5*  MCV 94.6  --  93.4 94.6 93.3  PLT 187  --  180 189 875   Basic Metabolic Panel: Recent Labs  Lab 07/27/2019 1740 07/22/2019 1747 08/11/19 0530 08/11/19 0535 08/12/19 0055 08/13/19 0100  NA 133* 136  --  137 137 139  K 3.5 3.5  --  3.6 3.9 3.6  CL 100  --   --  103 103 106  CO2 17*  --   --  20* 21* 25  GLUCOSE 333*  --   --  372* 227* 81  BUN 22  --   --  28* 36* 42*  CREATININE 1.21  --   --  1.20 0.92 1.00  CALCIUM 8.1*  --   --  8.1* 8.1* 8.3*  MG  --   --  2.0  --  2.0 2.2  PHOS  --   --  2.8  --  2.6 2.5   GFR: Estimated Creatinine Clearance: 45 mL/min (by C-G formula based on SCr of 1 mg/dL). Liver Function Tests: Recent Labs  Lab 08/12/2019 1740 08/11/19 0535 08/12/19 0055 08/13/19 0100  AST 39 43* 41 32  ALT 18 17 17 18   ALKPHOS 65 66 76 93  BILITOT 1.1 0.7 1.2 0.6  PROT 6.7 6.5 5.6* 5.4*  ALBUMIN 2.8* 2.7* 2.5* 2.3*   No results for input(s): LIPASE, AMYLASE in the last 168 hours. No results for input(s): AMMONIA in the last 168 hours. Coagulation Profile: No results for input(s): INR, PROTIME in the last 168 hours. Cardiac Enzymes: No results for input(s): CKTOTAL, CKMB, CKMBINDEX, TROPONINI in the last 168 hours. BNP (last 3 results) No results for input(s): PROBNP in the last 8760 hours. HbA1C: Recent Labs    08/11/19 0535  HGBA1C 9.4*   CBG: Recent Labs  Lab 08/12/19 1143 08/12/19 1609 08/12/19 2359 08/13/19 0306 08/13/19 0722  GLUCAP 370* 230* 200* 108* 188*   Lipid Profile: Recent Labs    07/24/2019 1740  TRIG 141   Thyroid Function Tests: No results for input(s): TSH,  T4TOTAL, FREET4, T3FREE, THYROIDAB in the last 72 hours. Anemia Panel: Recent Labs    08/12/19 0055 08/13/19 0100  FERRITIN 416* 396*   Urine analysis:    Component Value Date/Time   COLORURINE YELLOW 06/01/2019 [place] 06/01/2019 0853   LABSPEC 1.020 06/01/2019 0853   PHURINE 6.5 06/01/2019 0853   GLUCOSEU 250 (A) 06/01/2019 0853   HGBUR NEGATIVE 06/01/2019 [place] 06/01/2019 [place] 06/01/2019 0853   PROTEINUR 100 (A) 12/15/2016 1216   UROBILINOGEN 0.2 06/01/2019 0853   NITRITE NEGATIVE 06/01/2019 0853   LEUKOCYTESUR NEGATIVE 06/01/2019 0853   Sepsis Labs: @LABRCNTIP (procalcitonin:4,lacticidven:4)  ) Recent Results (from the past 240 hour(s))  Blood Culture (routine x 2)     Status: None (Preliminary result)   Collection Time: 07/29/2019  5:40 PM   Specimen: BLOOD  Result Value Ref Range Status   Specimen Description   Final    BLOOD BLOOD LEFT WRIST Performed at [facility], [address] [place], [place] [address]    Special Requests   Final    BOTTLES DRAWN AEROBIC AND ANAEROBIC Blood Culture adequate volume   Culture   Final    NO GROWTH 2 DAYS Performed at [facility], [place] [address] [place], [place] [address]    Report Status PENDING  Incomplete  SARS Coronavirus 2 Ag (30 min TAT) - Nasal Swab (BD Veritor Kit)     Status: Abnormal   Collection Time: 08/12/2019  5:45 PM   Specimen: Nasal Swab (BD Veritor Kit)  Result Value Ref Range Status   SARS Coronavirus 2 Ag POSITIVE (A) NEGATIVE Final    Comment: RESULT CALLED TO, READ BACK BY AND VERIFIED WITH: Ilda Basset RN @2036  08/01/2019 OLSONM (NOTE) SARS-CoV-2 antigen PRESENT. Positive results indicate the presence of viral antigens, but clinical correlation with patient history and other diagnostic information is necessary to determine patient infection status.  Positive results do not rule out bacterial infection or co-infection   with other viruses. False positive results are rare but can occur, and confirmatory RT-PCR testing may be appropriate in some circumstances. The expected result is Negative. Fact Sheet for Patients: PodPark.tn  Fact Sheet for Providers: GiftContent.is  This test is not yet approved or cleared by the [place] FDA and  has been authorized for detection and/or diagnosis of SARS-CoV-2 by FDA under an Emergency Use Authorization (EUA).  This EUA will remain in effect (meaning this test can be used) for the duration of  the COVID-19 decl aration under Section 564(b)(1) of the Act, 21 U.S.C. section 360bbb-3(b)(1), unless the authorization is terminated or revoked sooner. Performed at [facility], [address] [place], [place] [address]   MRSA PCR Screening     Status: Abnormal   Collection Time: 07/17/2019 10:20 PM   Specimen: Nasal Mucosa; Nasopharyngeal  Result Value Ref Range Status   MRSA by PCR POSITIVE (A) NEGATIVE Final    Comment:        The GeneXpert MRSA Assay (FDA approved for NASAL specimens only), is one component of a comprehensive MRSA colonization surveillance program. It is not intended to diagnose MRSA infection nor to guide or monitor treatment for MRSA infections. RESULT CALLED TO, READ BACK BY AND VERIFIED WITH: York Pellant @ 2505 ON 08/11/2019 C VARNER  Performed at [facility], [place] [address], [place], [place] [address]          Radiology Studies: CT Angio Chest PE W and/or Wo Contrast  Result Date: 08/11/2019 CLINICAL DATA:  Shortness of breath EXAM: CT ANGIOGRAPHY CHEST WITH CONTRAST TECHNIQUE: Multidetector CT imaging of the chest was performed using the standard protocol during bolus administration of intravenous contrast. Multiplanar CT image reconstructions and MIPs were obtained to evaluate the vascular anatomy. CONTRAST:  54m OMNIPAQUE IOHEXOL 350  MG/ML SOLN COMPARISON:  None. FINDINGS: Cardiovascular: Normal heart size. No pericardial effusion. Significant motion artifact at the bases. No evidence of pulmonary artery filling defect. Atherosclerotic calcification of the aorta and coronaries. Mediastinum/Nodes: Negative for adenopathy or mass Lungs/Pleura: Hazy ground-glass opacity throughout all lobes in keeping with history of COVID-19 positivity. No edema or effusion. There is centrilobular and panlobular apical emphysema. Upper Abdomen: Negative Musculoskeletal: Spinal degeneration. No acute or aggressive finding Review of the MIP images confirms the above findings. IMPRESSION: 1. Extensive ground-glass opacities in keeping with history of COVID-19 positivity. 2. Motion degraded study with no evidence of pulmonary embolism. 3. Aortic Atherosclerosis (ICD10-I70.0) and Emphysema (ICD10-J43.9). Electronically Signed   By: JMonte FantasiaM.D.   On: 08/11/2019 10:10   ECHOCARDIOGRAM COMPLETE  Result Date: 08/11/2019   ECHOCARDIOGRAM REPORT   Patient Name:   Aaron Blair Date of Exam: 08/11/2019 Medical Rec #:  [id number]   Height:       63.0 in Accession #:    [id number]  Weight:       140.0 lb Date of Birth:  [birth year]   BSA:          1.66 m Patient Age:    89years     BP:           130/90 mmHg Patient Gender: M            HR:           87 bpm. Exam Location:  Inpatient Procedure: 2D Echo, Cardiac Doppler and Color Doppler Indications:    R07.9* Chest pain, unspecified  History:        Patient has no prior history of Echocardiogram examinations.                 Signs/Symptoms:Dyspnea; Risk Factors:Hypertension, Diabetes,  Dyslipidemia and Former Smoker. Covid 19 positive. Respiratory                 failure. Hypoxia.  Sonographer:    Roseanna Rainbow RDCS Referring Phys: [id number] [place]  1. Left ventricular ejection fraction, by visual estimation, is 65 to 70%. The left ventricle has normal function. There is moderately  increased left ventricular hypertrophy.  2. Left ventricular diastolic parameters are consistent with Grade I diastolic dysfunction (impaired relaxation).  3. The left ventricle has no regional wall motion abnormalities.  4. Global right ventricle has normal systolic function.The right ventricular size is mildly enlarged. No increase in right ventricular wall thickness.  5. Left atrial size was normal.  6. Right atrial size was normal.  7. Presence of pericardial fat pad.  8. Trivial pericardial effusion is present.  9. Mild mitral annular calcification. 10. The mitral valve is degenerative. Trivial mitral valve regurgitation. 11. The tricuspid valve is grossly normal. 12. The aortic valve is tricuspid. Aortic valve regurgitation is not visualized. Mild to moderate aortic valve sclerosis/calcification without any evidence of aortic stenosis. 13. There is severe calcifcation of the aortic valve. 14. Severely calcified AoV with focal calcification of the [place]. Restricted movement of the [place]. 15. The pulmonic valve was grossly normal. Pulmonic valve regurgitation is not visualized. 16. Normal pulmonary artery systolic pressure. 17. The tricuspid regurgitant velocity is 2.53 m/s, and with an assumed right atrial pressure of 3 mmHg, the estimated right ventricular systolic pressure is normal at 28.5 mmHg. 18. The inferior vena cava is normal in size with greater than 50% respiratory variability, suggesting right atrial pressure of 3 mmHg. 19. No prior Echocardiogram. FINDINGS  Left Ventricle: Left ventricular ejection fraction, by visual estimation, is 65 to 70%. The left ventricle has normal function. The left ventricle has no regional wall motion abnormalities. There is moderately increased left ventricular hypertrophy. Concentric left ventricular hypertrophy. Left ventricular diastolic parameters are consistent with Grade I diastolic dysfunction (impaired relaxation). Normal left atrial pressure. Right Ventricle: The  right ventricular size is mildly enlarged. No increase in right ventricular wall thickness. Global RV systolic function is has normal systolic function. The tricuspid regurgitant velocity is 2.53 m/s, and with an assumed right atrial  pressure of 3 mmHg, the estimated right ventricular systolic pressure is normal at 28.5 mmHg. Left Atrium: Left atrial size was normal in size. Right Atrium: Right atrial size was normal in size Pericardium: Trivial pericardial effusion is present. Presence of pericardial fat pad. Mitral Valve: The mitral valve is degenerative in appearance. Mild mitral annular calcification. Trivial mitral valve regurgitation. Tricuspid Valve: The tricuspid valve is grossly normal. Tricuspid valve regurgitation is mild. Aortic Valve: The aortic valve is tricuspid. . There is mild thickening and severe calcifcation of the aortic valve. Aortic valve regurgitation is not visualized. Mild to moderate aortic valve sclerosis/calcification is present, without any evidence of aortic stenosis. There is mild thickening of the aortic valve. There is severe calcifcation of the aortic valve. Aortic valve mean gradient measures 7.0 mmHg. Aortic valve peak gradient measures 12.7 mmHg. Aortic valve area, by VTI measures 1.19 cm. Severely calcified AoV with focal calcification of the [place]. Restricted movement of the [place]. Pulmonic Valve: The pulmonic valve was grossly normal. Pulmonic valve regurgitation is not visualized. Pulmonic regurgitation is not visualized. Aorta: The aortic root and ascending aorta are structurally normal, with no evidence of dilitation. Venous: The inferior vena cava is normal in size with greater than 50% respiratory  variability, suggesting right atrial pressure of 3 mmHg. IAS/Shunts: No atrial level shunt detected by color flow Doppler.  LEFT VENTRICLE PLAX 2D LVIDd:         3.68 cm       Diastology LVIDs:         2.35 cm       LV e' lateral:   6.31 cm/s LV PW:         1.74 cm       LV E/e'  lateral: 11.9 LV IVS:        1.41 cm       LV e' medial:    5.77 cm/s LVOT diam:     1.70 cm       LV E/e' medial:  13.0 LV SV:         38 ml LV SV Index:   22.62 LVOT Area:     2.27 cm  LV Volumes (MOD) LV area d, A2C:    23.30 cm LV area d, A4C:    22.20 cm LV area s, A2C:    12.30 cm LV area s, A4C:    12.45 cm LV major d, A2C:   7.78 cm LV major d, A4C:   6.09 cm LV major s, A2C:   6.92 cm LV major s, A4C:   5.66 cm LV vol d, MOD A2C: 60.2 ml LV vol d, MOD A4C: 66.2 ml LV vol s, MOD A2C: 19.2 ml LV vol s, MOD A4C: 23.3 ml LV SV MOD A2C:     41.0 ml LV SV MOD A4C:     66.2 ml LV SV MOD BP:      47.8 ml RIGHT VENTRICLE             IVC RV S prime:     11.90 cm/s  IVC diam: 1.63 cm TAPSE (M-mode): 1.3 cm LEFT ATRIUM           Index       RIGHT ATRIUM           Index LA diam:      3.00 cm 1.81 cm/m  RA Area:     11.20 cm LA Vol (A2C): 37.8 ml 22.75 ml/m RA Volume:   20.20 ml  12.16 ml/m LA Vol (A4C): 21.1 ml 12.70 ml/m  AORTIC VALVE AV Area (Vmax):    1.30 cm AV Area (Vmean):   1.25 cm AV Area (VTI):     1.19 cm AV Vmax:           178.39 cm/s AV Vmean:          122.100 cm/s AV VTI:            0.386 m AV Peak Grad:      12.7 mmHg AV Mean Grad:      7.0 mmHg LVOT Vmax:         102.00 cm/s LVOT Vmean:        67.100 cm/s LVOT VTI:          0.202 m LVOT/AV VTI ratio: 0.52  AORTA Ao Root diam: 3.10 cm Ao Asc diam:  3.30 cm MITRAL VALVE                         TRICUSPID VALVE MV Area (PHT): 3.53 cm              TR Peak grad:   25.5 mmHg MV PHT:  62.35 msec            TR Vmax:        260.00 cm/s MV Decel Time: 215 msec MV E velocity: 75.20 cm/s  103 cm/s  SHUNTS MV A velocity: 102.30 cm/s 70.3 cm/s Systemic VTI:  0.20 m MV E/A ratio:  0.74        1.5       Systemic Diam: 1.70 cm  Eleonore Chiquito MD Electronically signed by Eleonore Chiquito MD Signature Date/Time: 08/11/2019/3:51:25 PM    Final         Scheduled Meds:  amLODipine  5 mg Oral Daily   vitamin C  500 mg Oral Daily   aspirin EC  81 mg  Oral Daily   Chlorhexidine Gluconate Cloth  6 each Topical Daily   dexamethasone  6 mg Oral Q24H   docusate sodium  100 mg Oral Daily   enoxaparin (LOVENOX) injection  1 mg/kg Subcutaneous Q12H   insulin aspart  0-20 Units Subcutaneous Q4H   insulin aspart  8 Units Subcutaneous QID   insulin glargine  15 Units Subcutaneous Daily   Ipratropium-Albuterol  1 puff Inhalation QID   lisinopril  20 mg Oral Daily   mupirocin ointment   Nasal BID   zinc sulfate  220 mg Oral Daily   Continuous Infusions:  sodium chloride Stopped (07/21/2019 2259)   remdesivir 100 mg in NS 100 mL Stopped (08/12/19 0922)     LOS: 3 days   The patient is critically ill with multiple organ systems failure and requires high complexity decision making for assessment and support, frequent evaluation and titration of therapies, application of advanced monitoring technologies and extensive interpretation of multiple databases. Critical Care Time devoted to patient care services described in this note  Time spent: 40 minutes     Marios Gaiser, Geraldo Docker, MD [facility] Pager [phone]  If 7PM-7AM, please contact night-coverage [email or web address] Password [email or web address] 08/13/2019, 8:08 AM

## 2019-08-13 NOTE — Progress Notes (Signed)
Patients son updated on Plan of care. Pt son expressed concern about patient not eating. Explained to pt's son that we will continue to encourage pt to eat.

## 2019-08-13 NOTE — Progress Notes (Signed)
Notified Dr. Robb Matar that patients insulin gtt rate was changed from 1 unit/hr to 13 unit/hr per endotool calculations with a CBG of 363. See new orders

## 2019-08-13 DEATH — deceased

## 2019-08-14 ENCOUNTER — Inpatient Hospital Stay (HOSPITAL_COMMUNITY): Payer: Medicare Other

## 2019-08-14 DIAGNOSIS — R791 Abnormal coagulation profile: Secondary | ICD-10-CM

## 2019-08-14 DIAGNOSIS — U071 COVID-19: Secondary | ICD-10-CM

## 2019-08-14 LAB — COMPREHENSIVE METABOLIC PANEL
ALT: 24 U/L (ref 0–44)
AST: 29 U/L (ref 15–41)
Albumin: 2.3 g/dL — ABNORMAL LOW (ref 3.5–5.0)
Alkaline Phosphatase: 109 U/L (ref 38–126)
Anion gap: 9 (ref 5–15)
BUN: 33 mg/dL — ABNORMAL HIGH (ref 8–23)
CO2: 23 mmol/L (ref 22–32)
Calcium: 7.8 mg/dL — ABNORMAL LOW (ref 8.9–10.3)
Chloride: 103 mmol/L (ref 98–111)
Creatinine, Ser: 0.81 mg/dL (ref 0.61–1.24)
GFR calc Af Amer: >60 mL/min (ref >60)
GFR calc non Af Amer: >60 mL/min (ref >60)
Glucose, Bld: 127 mg/dL — ABNORMAL HIGH (ref 70–99)
Potassium: 4.2 mmol/L (ref 3.5–5.1)
Sodium: 135 mmol/L (ref 135–145)
Total Bilirubin: 1 mg/dL (ref 0.3–1.2)
Total Protein: 5 g/dL — ABNORMAL LOW (ref 6.5–8.1)

## 2019-08-14 LAB — GLUCOSE, CAPILLARY
Glucose-Capillary: 157 mg/dL — ABNORMAL HIGH (ref 70–99)
Glucose-Capillary: 171 mg/dL — ABNORMAL HIGH (ref 70–99)
Glucose-Capillary: 187 mg/dL — ABNORMAL HIGH (ref 70–99)
Glucose-Capillary: 241 mg/dL — ABNORMAL HIGH (ref 70–99)
Glucose-Capillary: 288 mg/dL — ABNORMAL HIGH (ref 70–99)
Glucose-Capillary: 400 mg/dL — ABNORMAL HIGH (ref 70–99)
Glucose-Capillary: 82 mg/dL (ref 70–99)
Glucose-Capillary: 89 mg/dL (ref 70–99)

## 2019-08-14 LAB — CBC WITH DIFFERENTIAL/PLATELET
Abs Immature Granulocytes: 0.54 10*3/uL — ABNORMAL HIGH (ref 0.00–0.07)
Basophils Absolute: 0 10*3/uL (ref 0.0–0.1)
Basophils Relative: 0 %
Eosinophils Absolute: 0 10*3/uL (ref 0.0–0.5)
Eosinophils Relative: 0 %
HCT: 33.9 % — ABNORMAL LOW (ref 39.0–52.0)
Hemoglobin: 11.6 g/dL — ABNORMAL LOW (ref 13.0–17.0)
Immature Granulocytes: 4 %
Lymphocytes Relative: 4 %
Lymphs Abs: 0.6 10*3/uL — ABNORMAL LOW (ref 0.7–4.0)
MCH: 31.8 pg (ref 26.0–34.0)
MCHC: 34.2 g/dL (ref 30.0–36.0)
MCV: 92.9 fL (ref 80.0–100.0)
Monocytes Absolute: 0.4 10*3/uL (ref 0.1–1.0)
Monocytes Relative: 3 %
Neutro Abs: 13.3 10*3/uL — ABNORMAL HIGH (ref 1.7–7.7)
Neutrophils Relative %: 89 %
Platelets: 212 10*3/uL (ref 150–400)
RBC: 3.65 MIL/uL — ABNORMAL LOW (ref 4.22–5.81)
RDW: 12.2 % (ref 11.5–15.5)
WBC: 14.9 10*3/uL — ABNORMAL HIGH (ref 4.0–10.5)
nRBC: 0.3 % — ABNORMAL HIGH (ref 0.0–0.2)

## 2019-08-14 LAB — D-DIMER, QUANTITATIVE: D-Dimer, Quant: 3.04 ug/mL-FEU — ABNORMAL HIGH (ref 0.00–0.50)

## 2019-08-14 LAB — C-REACTIVE PROTEIN: CRP: 1.5 mg/dL — ABNORMAL HIGH (ref <1.0)

## 2019-08-14 LAB — FERRITIN: Ferritin: 523 ng/mL — ABNORMAL HIGH (ref 24–336)

## 2019-08-14 LAB — PHOSPHORUS: Phosphorus: 2.6 mg/dL (ref 2.5–4.6)

## 2019-08-14 LAB — BRAIN NATRIURETIC PEPTIDE: B Natriuretic Peptide: 122.1 pg/mL — ABNORMAL HIGH (ref 0.0–100.0)

## 2019-08-14 LAB — MAGNESIUM: Magnesium: 2.2 mg/dL (ref 1.7–2.4)

## 2019-08-14 MED ORDER — INSULIN ASPART 100 UNIT/ML ~~LOC~~ SOLN
0.0000 [IU] | Freq: Every day | SUBCUTANEOUS | Status: DC
  Administered 2019-08-14: 3 [IU] via SUBCUTANEOUS
  Administered 2019-08-15: 21:00:00 5 [IU] via SUBCUTANEOUS
  Administered 2019-08-16: 3 [IU] via SUBCUTANEOUS
  Administered 2019-08-18: 2 [IU] via SUBCUTANEOUS
  Administered 2019-08-19: 3 [IU] via SUBCUTANEOUS
  Administered 2019-08-22: 4 [IU] via SUBCUTANEOUS
  Administered 2019-08-23 – 2019-08-28 (×2): 2 [IU] via SUBCUTANEOUS
  Administered 2019-08-29: 3 [IU] via SUBCUTANEOUS
  Administered 2019-08-31: 2 [IU] via SUBCUTANEOUS

## 2019-08-14 MED ORDER — FUROSEMIDE 10 MG/ML IJ SOLN
60.0000 mg | Freq: Two times a day (BID) | INTRAMUSCULAR | Status: AC
  Administered 2019-08-14 – 2019-08-15 (×2): 60 mg via INTRAVENOUS
  Filled 2019-08-14 (×2): qty 6

## 2019-08-14 MED ORDER — INSULIN ASPART 100 UNIT/ML ~~LOC~~ SOLN
8.0000 [IU] | Freq: Three times a day (TID) | SUBCUTANEOUS | Status: DC
  Administered 2019-08-14 – 2019-08-16 (×6): 8 [IU] via SUBCUTANEOUS

## 2019-08-14 MED ORDER — INSULIN ASPART 100 UNIT/ML ~~LOC~~ SOLN
0.0000 [IU] | SUBCUTANEOUS | Status: DC
  Administered 2019-08-14 (×2): 4 [IU] via SUBCUTANEOUS

## 2019-08-14 MED ORDER — INSULIN ASPART 100 UNIT/ML ~~LOC~~ SOLN
0.0000 [IU] | Freq: Three times a day (TID) | SUBCUTANEOUS | Status: DC
  Administered 2019-08-14: 7 [IU] via SUBCUTANEOUS
  Administered 2019-08-14: 20 [IU] via SUBCUTANEOUS
  Administered 2019-08-15: 11 [IU] via SUBCUTANEOUS
  Administered 2019-08-15: 13:00:00 20 [IU] via SUBCUTANEOUS
  Administered 2019-08-15: 15 [IU] via SUBCUTANEOUS
  Administered 2019-08-16 (×2): 7 [IU] via SUBCUTANEOUS
  Administered 2019-08-16 – 2019-08-17 (×2): 20 [IU] via SUBCUTANEOUS
  Administered 2019-08-17: 4 [IU] via SUBCUTANEOUS
  Administered 2019-08-17: 15 [IU] via SUBCUTANEOUS
  Administered 2019-08-18: 7 [IU] via SUBCUTANEOUS
  Administered 2019-08-18: 4 [IU] via SUBCUTANEOUS
  Administered 2019-08-18: 7 [IU] via SUBCUTANEOUS
  Administered 2019-08-19: 4 [IU] via SUBCUTANEOUS
  Administered 2019-08-19 (×2): 3 [IU] via SUBCUTANEOUS
  Administered 2019-08-20: 4 [IU] via SUBCUTANEOUS
  Administered 2019-08-20: 15 [IU] via SUBCUTANEOUS
  Administered 2019-08-21: 11 [IU] via SUBCUTANEOUS
  Administered 2019-08-22: 7 [IU] via SUBCUTANEOUS
  Administered 2019-08-23: 3 [IU] via SUBCUTANEOUS
  Administered 2019-08-23: 12:00:00 4 [IU] via SUBCUTANEOUS
  Administered 2019-08-24: 17:00:00 3 [IU] via SUBCUTANEOUS
  Administered 2019-08-24: 15 [IU] via SUBCUTANEOUS
  Administered 2019-08-25: 4 [IU] via SUBCUTANEOUS
  Administered 2019-08-25: 3 [IU] via SUBCUTANEOUS
  Administered 2019-08-25: 4 [IU] via SUBCUTANEOUS
  Administered 2019-08-26: 7 [IU] via SUBCUTANEOUS
  Administered 2019-08-26: 4 [IU] via SUBCUTANEOUS
  Administered 2019-08-26: 15 [IU] via SUBCUTANEOUS
  Administered 2019-08-27 (×2): 4 [IU] via SUBCUTANEOUS
  Administered 2019-08-28: 11 [IU] via SUBCUTANEOUS
  Administered 2019-08-28: 20 [IU] via SUBCUTANEOUS
  Administered 2019-08-29 (×2): 3 [IU] via SUBCUTANEOUS
  Administered 2019-08-29 – 2019-08-30 (×2): 4 [IU] via SUBCUTANEOUS
  Administered 2019-08-30: 11 [IU] via SUBCUTANEOUS
  Administered 2019-08-30: 15 [IU] via SUBCUTANEOUS
  Administered 2019-08-31: 3 [IU] via SUBCUTANEOUS
  Administered 2019-08-31 – 2019-09-01 (×2): 4 [IU] via SUBCUTANEOUS
  Administered 2019-09-02: 7 [IU] via SUBCUTANEOUS
  Administered 2019-09-03: 19:00:00 11 [IU] via SUBCUTANEOUS
  Administered 2019-09-04: 4 [IU] via SUBCUTANEOUS

## 2019-08-14 MED ORDER — INSULIN DETEMIR 100 UNIT/ML ~~LOC~~ SOLN
6.0000 [IU] | Freq: Two times a day (BID) | SUBCUTANEOUS | Status: DC
  Administered 2019-08-14 – 2019-08-16 (×5): 6 [IU] via SUBCUTANEOUS
  Filled 2019-08-14 (×5): qty 0.06

## 2019-08-14 MED ORDER — INSULIN ASPART 100 UNIT/ML ~~LOC~~ SOLN: 4.0000 [IU] | Freq: Three times a day (TID) | SUBCUTANEOUS | Status: DC

## 2019-08-14 NOTE — Plan of Care (Signed)

## 2019-08-14 NOTE — Progress Notes (Signed)
Inpatient Diabetes Program Recommendations  AACE/ADA: New Consensus Statement on Inpatient Glycemic Control (2015)  Target Ranges:  Prepandial:   less than 140 mg/dL      Peak postprandial:   less than 180 mg/dL (1-2 hours)      Critically ill patients:  140 - 180 mg/dL   Lab Results  Component Value Date   GLUCAP 187 (H) 08/14/2019   HGBA1C 9.4 (H) 08/11/2019    Review of Glycemic Control  Inpatient Diabetes Program Recommendations:   Reviewed CBGs and no basal given prior to D/C of insulin drip. -Levemir 6 units bid -Add Novolog 4 units tid meal coverage if eats 50% -Decrease Novolog correction to resistant tid + hs 0-5 Secure chat to Dr. Joseph Art with recommendations.   Thank you, Billy Fischer. Jermon Chalfant, RN, MSN, CDE  Diabetes Coordinator Inpatient Glycemic Control Team Team Pager [phone] (8am-5pm) 08/14/2019 9:51 AM

## 2019-08-14 NOTE — Progress Notes (Signed)
Bilateral lower extremity venous duplex has been completed. Preliminary results can be found in CV Proc through chart review.   08/14/19 10:53 AM Olen Cordial RVT

## 2019-08-14 NOTE — Progress Notes (Signed)
Patient's son Emelda Fear given a update on patient.

## 2019-08-14 NOTE — Progress Notes (Addendum)
PROGRESS NOTE    Aaron Blair  EZM:[id number] DOB: 1933/08/21 DOA: 08/07/2019 PCP: No primary care provider on file.   Brief Narrative:  ([place] speaker) 86 y.o.  Asian male, PMHx  diabetes type II uncontrolled with complication,  HTN, HLD   Presents as a transfer from [facility] for management of respiratory failure with hypoxia.  Patient does not speak [place], history obtained from family over the phone.  Over the past week patient has had subjective fevers with chills, poor appetite and started having shortness of breath with lethargy today prompting the ER presentation.  In the ER he was found to have oxygen saturation in the 50s on room air, CXR consistent with viral pneumonia, SARS-CoV-2 positive.  Patient transferred for further management.  No reported nausea, vomiting, diarrhea.  He has intermittent cough which is nonproductive.  No reported chest pain, palpitations, orthopnea, PND, lower extremity edema.    Subjective: 1/2 afebrile last 24 hours.  A/O x4, negative CP, negative abdominal pain.  Positive S OB   Assessment & Plan:   Principal Problem:   Acute hypoxemic respiratory failure due to severe acute respiratory syndrome coronavirus 2 (SARS-CoV-2) disease (HCC) Active Problems:   Hypertension   Hyperlipidemia   Diabetes type 2, uncontrolled ([place])   Demand ischemia (HCC)   Pneumonia due to COVID-19 virus   Acute respiratory failure with hypoxia ([place])   Uncontrolled type 2 diabetes mellitus with complication (HCC)   Elevated troponin   Essential hypertension  Covid pneumonia/acute respiratory failure with hypoxia COVID-19 Labs Recent Labs    08/12/19 0055 08/13/19 0100 08/14/19 0330  DDIMER 5.03* 3.45* 3.04*  FERRITIN 416* 396* 523*  CRP 9.2* 3.8* 1.5*    12/29 SARS coronavirus positive  -Decadron 6 mg Daily -Remdesivir per pharmacy protocol -Combivent QID  -vitamins per Covid protocol -Flutter valve -Incentive spirometry -12/30 Actemra x1 dose -1/2  Lasix IV 60 mg x 2 dose -1/3 PCXR pending  Diabetes type 2 uncontrolled with complication -50/35 hemoglobin A1c= 9.8 -1/1 Endo tool until patient's CBG back under control -1/2 Levemir 6 BID -1/2 NovoLog 8 units qac -1/2 resistant SSI   Elevated D-dimer -CTA PE protocol negative see results below -Bilateral lower extremity Doppler negative DVT  Chest pain/elevated troponin/demand ischemia -EKG pending -Eechocardiogram; not consistent with ACS/MI see results below  Essential HTN -Amlodipine 5 mg daily -Lisinopril lisinopril 20 mg daily  Hypokalemia -Potassium goal> 4    DVT prophylaxis: Lovenox Code Status: DNR Family Communication: 08/14/2019 spoke with Leigh Aurora (son) explained plan of care answered all questions Disposition Plan: TBD   Consultants:    Procedures/Significant Events:  12/30 Echocardiogram Left Ventricle: EV= 65 to 70%. - moderately increased LVH Aortic Valve: Severely calcified AoV with focal calcification of the NCC. Restricted movement of the [facility] 12/30 CTA chest PE protocol;.-Extensive ground-glass opacities in keeping with history of COVID-19 positivity. - Motion degraded study with no evidence of pulmonary embolism. 1/2 bilateral lower extremity Doppler; negative DVT    I have personally reviewed and interpreted all radiology studies and my findings are as above.  VENTILATOR SETTINGS: HFNC+NRB 1/2 Flow; 15 L/min SPO2 87%   Cultures   Antimicrobials: Anti-infectives (From admission, onward)   Start     Dose/Rate Stop   08/11/19 1000  remdesivir 100 mg in sodium chloride 0.9 % 100 mL IVPB     100 mg 200 mL/hr over 30 Minutes 08/15/19 0959   07/21/2019 2230  remdesivir 200 mg in sodium chloride 0.9% 250 mL IVPB  200 mg 580 mL/hr over 30 Minutes 08/09/2019 2251   07/16/2019 1745  cefTRIAXone (ROCEPHIN) 1 g in sodium chloride 0.9 % 100 mL IVPB     1 g 200 mL/hr over 30 Minutes 07/30/2019 1945   08/01/2019 1745  azithromycin (ZITHROMAX) 500 mg in  sodium chloride 0.9 % 250 mL IVPB     500 mg 250 mL/hr over 60 Minutes 07/19/2019 2057       Devices    LINES / TUBES:      Continuous Infusions: . 0.45 % NaCl with KCl 20 mEq / L 75 mL/hr at 08/13/19 2119  . sodium chloride Stopped (08/08/2019 2259)  . dextrose 5 % and 0.45% NaCl Stopped (08/14/19 0436)     Objective: Vitals:   08/14/19 0400 08/14/19 0600 08/14/19 0654 08/14/19 0723  BP: 130/70   117/71  Pulse:    74  Resp: (!) 21   18  Temp: 97.6 F (36.4 C)   (!) 97.4 F (36.3 C)  TempSrc: Oral   Axillary  SpO2: 94% 93% 95% 93%  Weight:      Height:        Intake/Output Summary (Last 24 hours) at 08/14/2019 1200 Last data filed at 08/14/2019 0900 Gross per 24 hour  Intake 1938.31 ml  Output 1500 ml  Net 438.31 ml   Filed Weights   07/22/2019 1711  Weight: 63.5 kg   Physical Exam:  General: A/O x4, positive acute respiratory distress Eyes: negative scleral hemorrhage, negative anisocoria, negative icterus ENT: Negative Runny nose, negative gingival bleeding, Neck:  Negative scars, masses, torticollis, lymphadenopathy, JVD Lungs: Clear to auscultation bilaterally without wheezes or crackles Cardiovascular: Regular rate and rhythm without murmur gallop or rub normal S1 and S2 Abdomen: negative abdominal pain, nondistended, positive soft, bowel sounds, no rebound, no ascites, no appreciable mass Extremities: No significant cyanosis, clubbing, or edema bilateral lower extremities Skin: Negative rashes, lesions, ulcers Psychiatric:  Negative depression, negative anxiety, negative fatigue, negative mania  Central nervous system:  Cranial nerves II through XII intact, tongue/uvula midline, all extremities muscle strength 5/5, sensation intact throughout, negative dysarthria, negative expressive aphasia, negative receptive aphasia.   .     Data Reviewed: Care during the described time interval was provided by me .  I have reviewed this patient's available data,  including medical history, events of note, physical examination, and all test results as part of my evaluation.   CBC: Recent Labs  Lab 07/14/2019 1740 07/31/2019 1747 08/11/19 0535 08/12/19 0055 08/13/19 0100 08/14/19 0330  WBC 5.7  --  4.7 14.6* 16.6* 14.9*  NEUTROABS 4.9  --  3.8 13.2* 15.4* 13.3*  HGB 12.4* 10.9* 12.6* 11.7* 11.5* 11.6*  HCT 37.1* 32.0* 36.6* 34.8* 33.5* 33.9*  MCV 94.6  --  93.4 94.6 93.3 92.9  PLT 187  --  180 189 213 212   Basic Metabolic Panel: Recent Labs  Lab 07/18/2019 1740 08/11/19 0530 08/11/19 0535 08/12/19 0055 08/13/19 0100 08/13/19 2235 08/14/19 0330  NA   < >  --  137 137 139 138 135  K   < >  --  3.6 3.9 3.6 3.6 4.2  CL  --   --  103 103 106 106 103  CO2  --   --  20* 21* 25 22 23  GLUCOSE  --   --  372* 227* 81 133* 127*  BUN  --   --  28* 36* 42* 41* 33*  CREATININE  --   --    1.20 0.92 1.00 0.92 0.81  CALCIUM  --   --  8.1* 8.1* 8.3* 8.3* 7.8*  MG  --  2.0  --  2.0 2.2  --  2.2  PHOS  --  2.8  --  2.6 2.5  --  2.6   < > = values in this interval not displayed.   GFR: Estimated Creatinine Clearance: 55.6 mL/min (by C-G formula based on SCr of 0.81 mg/dL). Liver Function Tests: Recent Labs  Lab 07/13/2019 1740 08/11/19 0535 08/12/19 0055 08/13/19 0100 08/14/19 0330  AST 39 43* 41 32 29  ALT 18 17 17 18 24  ALKPHOS 65 66 76 93 109  BILITOT 1.1 0.7 1.2 0.6 1.0  PROT 6.7 6.5 5.6* 5.4* 5.0*  ALBUMIN 2.8* 2.7* 2.5* 2.3* 2.3*   No results for input(s): LIPASE, AMYLASE in the last 168 hours. No results for input(s): AMMONIA in the last 168 hours. Coagulation Profile: No results for input(s): INR, PROTIME in the last 168 hours. Cardiac Enzymes: No results for input(s): CKTOTAL, CKMB, CKMBINDEX, TROPONINI in the last 168 hours. BNP (last 3 results) No results for input(s): PROBNP in the last 8760 hours. HbA1C: No results for input(s): HGBA1C in the last 72 hours. CBG: Recent Labs  Lab 08/14/19 0050 08/14/19 0246 08/14/19 0401  08/14/19 0801 08/14/19 1146  GLUCAP 82 157* 171* 187* 241*   Lipid Profile: No results for input(s): CHOL, HDL, LDLCALC, TRIG, CHOLHDL, LDLDIRECT in the last 72 hours. Thyroid Function Tests: No results for input(s): TSH, T4TOTAL, FREET4, T3FREE, THYROIDAB in the last 72 hours. Anemia Panel: Recent Labs    08/13/19 0100 08/14/19 0330  FERRITIN 396* 523*   Urine analysis:    Component Value Date/Time   COLORURINE YELLOW 06/01/2019 0853   APPEARANCEUR CLEAR 06/01/2019 0853   LABSPEC 1.020 06/01/2019 0853   PHURINE 6.5 06/01/2019 0853   GLUCOSEU 250 (A) 06/01/2019 0853   HGBUR NEGATIVE 06/01/2019 0853   BILIRUBINUR NEGATIVE 06/01/2019 0853   KETONESUR NEGATIVE 06/01/2019 0853   PROTEINUR 100 (A) 12/15/2016 1216   UROBILINOGEN 0.2 06/01/2019 0853   NITRITE NEGATIVE 06/01/2019 0853   LEUKOCYTESUR NEGATIVE 06/01/2019 0853   Sepsis Labs: @LABRCNTIP(procalcitonin:4,lacticidven:4)  ) Recent Results (from the past 240 hour(s))  Blood Culture (routine x 2)     Status: None (Preliminary result)   Collection Time: 07/30/2019  5:40 PM   Specimen: BLOOD  Result Value Ref Range Status   Specimen Description   Final    BLOOD BLOOD LEFT WRIST Performed at [facility] [place], [address] [place], [place] [address]    Special Requests   Final    BOTTLES DRAWN AEROBIC AND ANAEROBIC Blood Culture adequate volume   Culture   Final    NO GROWTH 4 DAYS Performed at [facility], [address] [place], [place] [address]    Report Status PENDING  Incomplete  SARS Coronavirus 2 Ag (30 min TAT) - Nasal Swab (BD Veritor Kit)     Status: Abnormal   Collection Time: 07/28/2019  5:45 PM   Specimen: Nasal Swab (BD Veritor Kit)  Result Value Ref Range Status   SARS Coronavirus 2 Ag POSITIVE (A) NEGATIVE Final    Comment: RESULT CALLED TO, READ BACK BY AND VERIFIED WITH: RUTH MARCUM RN @2036 07/15/2019 OLSONM (NOTE) SARS-CoV-2 antigen PRESENT. Positive results indicate the  presence of viral antigens, but clinical correlation with patient history and other diagnostic information is necessary to determine patient infection status.  Positive   results do not rule out bacterial infection or co-infection  with other viruses. False positive results are rare but can occur, and confirmatory RT-PCR testing may be appropriate in some circumstances. The expected result is Negative. Fact Sheet for Patients: https://www.fda.gov/media/139754/download Fact Sheet for Providers: https://www.fda.gov/media/139753/download  This test is not yet approved or cleared by the [place] FDA and  has been authorized for detection and/or diagnosis of SARS-CoV-2 by FDA under an Emergency Use Authorization (EUA).  This EUA will remain in effect (meaning this test can be used) for the duration of  the COVID-19 decl aration under Section 564(b)(1) of the Act, 21 U.S.C. section 360bbb-3(b)(1), unless the authorization is terminated or revoked sooner. Performed at [facility] [place], [address] [place], [place] [address]   MRSA PCR Screening     Status: Abnormal   Collection Time: 07/27/2019 10:20 PM   Specimen: Nasal Mucosa; Nasopharyngeal  Result Value Ref Range Status   MRSA by PCR POSITIVE (A) NEGATIVE Final    Comment:        The GeneXpert MRSA Assay (FDA approved for NASAL specimens only), is one component of a comprehensive MRSA colonization surveillance program. It is not intended to diagnose MRSA infection nor to guide or monitor treatment for MRSA infections. RESULT CALLED TO, READ BACK BY AND VERIFIED WITH: ASHLEY TOLLIVER @ 0609 ON 08/11/2019 C VARNER  Performed at [facility], [address] [place], [place] [address]          Radiology Studies: No results found.      Scheduled Meds: . amLODipine  5 mg Oral Daily  . vitamin C  500 mg Oral Daily  . aspirin EC  81 mg Oral Daily  . Chlorhexidine Gluconate Cloth  6 each  Topical Daily  . dexamethasone  6 mg Oral Q24H  . docusate sodium  100 mg Oral Daily  . enoxaparin (LOVENOX) injection  1 mg/kg Subcutaneous Q12H  . insulin aspart  0-20 Units Subcutaneous TID WC  . insulin aspart  0-5 Units Subcutaneous QHS  . insulin aspart  4 Units Subcutaneous TID WC  . insulin detemir  6 Units Subcutaneous BID  . Ipratropium-Albuterol  1 puff Inhalation QID  . lisinopril  20 mg Oral Daily  . mupirocin ointment   Nasal BID  . zinc sulfate  220 mg Oral Daily   Continuous Infusions: . 0.45 % NaCl with KCl 20 mEq / L 75 mL/hr at 08/13/19 2119  . sodium chloride Stopped (08/05/2019 2259)  . dextrose 5 % and 0.45% NaCl Stopped (08/14/19 0436)     LOS: 4 days   The patient is critically ill with multiple organ systems failure and requires high complexity decision making for assessment and support, frequent evaluation and titration of therapies, application of advanced monitoring technologies and extensive interpretation of multiple databases. Critical Care Time devoted to patient care services described in this note  Time spent: 40 minutes     WOODS, CURTIS J, MD [facility] Pager [phone]  If 7PM-7AM, please contact night-coverage [email or web address] Password [email or web address] 08/14/2019, 12:00 PM      

## 2019-08-15 ENCOUNTER — Inpatient Hospital Stay (HOSPITAL_COMMUNITY): Payer: Medicare Other

## 2019-08-15 DIAGNOSIS — I5032 Chronic diastolic (congestive) heart failure: Secondary | ICD-10-CM

## 2019-08-15 LAB — CBC WITH DIFFERENTIAL/PLATELET
Abs Immature Granulocytes: 0.6 10*3/uL — ABNORMAL HIGH (ref 0.00–0.07)
Basophils Absolute: 0.1 10*3/uL (ref 0.0–0.1)
Basophils Relative: 1 %
Eosinophils Absolute: 0 10*3/uL (ref 0.0–0.5)
Eosinophils Relative: 0 %
HCT: 35.8 % — ABNORMAL LOW (ref 39.0–52.0)
Hemoglobin: 12.5 g/dL — ABNORMAL LOW (ref 13.0–17.0)
Immature Granulocytes: 4 %
Lymphocytes Relative: 4 %
Lymphs Abs: 0.6 10*3/uL — ABNORMAL LOW (ref 0.7–4.0)
MCH: 32.2 pg (ref 26.0–34.0)
MCHC: 34.9 g/dL (ref 30.0–36.0)
MCV: 92.3 fL (ref 80.0–100.0)
Monocytes Absolute: 0.5 10*3/uL (ref 0.1–1.0)
Monocytes Relative: 3 %
Neutro Abs: 13.6 10*3/uL — ABNORMAL HIGH (ref 1.7–7.7)
Neutrophils Relative %: 88 %
Platelets: 234 10*3/uL (ref 150–400)
RBC: 3.88 MIL/uL — ABNORMAL LOW (ref 4.22–5.81)
RDW: 12.3 % (ref 11.5–15.5)
WBC: 15.3 10*3/uL — ABNORMAL HIGH (ref 4.0–10.5)
nRBC: 0.2 % (ref 0.0–0.2)

## 2019-08-15 LAB — COMPREHENSIVE METABOLIC PANEL
ALT: 28 U/L (ref 0–44)
AST: 25 U/L (ref 15–41)
Albumin: 2.5 g/dL — ABNORMAL LOW (ref 3.5–5.0)
Alkaline Phosphatase: 121 U/L (ref 38–126)
Anion gap: 11 (ref 5–15)
BUN: 29 mg/dL — ABNORMAL HIGH (ref 8–23)
CO2: 27 mmol/L (ref 22–32)
Calcium: 8.5 mg/dL — ABNORMAL LOW (ref 8.9–10.3)
Chloride: 102 mmol/L (ref 98–111)
Creatinine, Ser: 0.79 mg/dL (ref 0.61–1.24)
GFR calc Af Amer: >60 mL/min (ref >60)
GFR calc non Af Amer: >60 mL/min (ref >60)
Glucose, Bld: 60 mg/dL — ABNORMAL LOW (ref 70–99)
Potassium: 3.5 mmol/L (ref 3.5–5.1)
Sodium: 140 mmol/L (ref 135–145)
Total Bilirubin: 0.9 mg/dL (ref 0.3–1.2)
Total Protein: 5.2 g/dL — ABNORMAL LOW (ref 6.5–8.1)

## 2019-08-15 LAB — CULTURE, BLOOD (ROUTINE X 2)
Culture: NO GROWTH
Special Requests: ADEQUATE

## 2019-08-15 LAB — GLUCOSE, CAPILLARY
Glucose-Capillary: 150 mg/dL — ABNORMAL HIGH (ref 70–99)
Glucose-Capillary: 477 mg/dL — ABNORMAL HIGH (ref 70–99)
Glucose-Capillary: 292 mg/dL — ABNORMAL HIGH (ref 70–99)
Glucose-Capillary: 351 mg/dL — ABNORMAL HIGH (ref 70–99)

## 2019-08-15 LAB — D-DIMER, QUANTITATIVE: D-Dimer, Quant: 3.28 ug/mL-FEU — ABNORMAL HIGH (ref 0.00–0.50)

## 2019-08-15 LAB — C-REACTIVE PROTEIN: CRP: 1 mg/dL — ABNORMAL HIGH (ref <1.0)

## 2019-08-15 LAB — MAGNESIUM: Magnesium: 2 mg/dL (ref 1.7–2.4)

## 2019-08-15 LAB — PHOSPHORUS: Phosphorus: 2.7 mg/dL (ref 2.5–4.6)

## 2019-08-15 LAB — FERRITIN: Ferritin: 487 ng/mL — ABNORMAL HIGH (ref 24–336)

## 2019-08-15 MED ORDER — ENOXAPARIN SODIUM 40 MG/0.4ML ~~LOC~~ SOLN
40.0000 mg | SUBCUTANEOUS | Status: DC
  Administered 2019-08-16 – 2019-08-24 (×9): 40 mg via SUBCUTANEOUS
  Filled 2019-08-15 (×9): qty 0.4

## 2019-08-15 NOTE — Progress Notes (Signed)
RT used [place] interpretor and pt denies SOB, no increased WOB, Pt states he is breathing fine. VS within normal limits.

## 2019-08-15 NOTE — Progress Notes (Signed)
PROGRESS NOTE  Aaron Blair AGT:[id number] DOB: 1933-09-28 DOA: 08/05/2019 PCP: No primary care provider on file.  HPI/Recap of past 70 hours: 86 year old [place] speaking male with past medical history of poorly controlled diabetes mellitus, hypertension admitted on 12/29 after coming in with 1 week of subjective fever and poor p.o. intake and 1 day of shortness of breath.  The emergency room, found to be hypoxic and positive for Covid.  Following admission, patient treated with Actemra, Decadron and remdesivir.  A1c elevated at 9.8 and during patient's hospitalization, insulin coverage has been titrated up.  Noted to have elevated troponins, felt to be in the setting of mild demand ischemia, but no evidence of ACS.  Echocardiogram unrevealing.  Patient still requiring high flow oxygen, currently at 15 L.  He himself is comfortable and has no complaints.  Assessment/Plan: Principal Problem:   Acute hypoxemic respiratory failure due to severe acute respiratory syndrome coronavirus 2 (SARS-CoV-2) disease ([place]): Continue IV steroids.  Has completed Actemra and remdesivir.  Still requiring significant mount of oxygen. Active Problems:   Chronic diastolic CHF (congestive heart failure) ([place]): We will stop IV fluids, check morning BMP.  Following daily weights.  Patient looks to be -4 L from admission.   Hypertension: Blood pressure stable    Uncontrolled type 2 diabetes mellitus with complication [facility]): Titrating up insulin accordingly.  Patient has been on dextrose in his IV fluids which we have discontinued.   Elevated troponin, felt to be secondary to demand ischemia: Stable.  No evidence of ACS.  Echocardiogram results as below.    Code Status: DNR  Family Communication: Left message for son  Disposition Plan: Home once able to wean off of oxygen   Consultants:  None  Procedures:  Echocardiogram done 12/30: Preserved ejection fraction, diastolic dysfunction.  Lower extremity  Dopplers done 1/2: No evidence of DVT.  Antimicrobials:  IV Remdisivir 12/9-1/2  IV Actemra  DVT prophylaxis:  Lovenox   Objective: Vitals:   08/15/19 1148 08/15/19 1644  BP: (!) 121/93 134/63  Pulse: 98 92  Resp: (!) 24 (!) 21  Temp: (!) 97.5 F (36.4 C) 97.9 F (36.6 C)  SpO2: 94% 95%    Intake/Output Summary (Last 24 hours) at 08/15/2019 1907 Last data filed at 08/15/2019 1744 Gross per 24 hour  Intake 120 ml  Output 3200 ml  Net -3080 ml   Filed Weights   07/21/2019 1711  Weight: 63.5 kg   Body mass index is 24.8 kg/m.  Exam:   General: Alert and oriented x3, no acute distress  HEENT: Normocephalic and atraumatic, mucous membranes are moist  Neck: Supple, no JVD  Cardiovascular: Regular rate and rhythm, S1-S2  Respiratory: Clear auscultation bilaterally  Abdomen: Soft, nontender, nondistended, positive bowel sounds  Musculoskeletal: No clubbing or cyanosis or edema  Skin: No skin breaks, tears or lesions  Neuro: No focal deficits  Psychiatry: Appropriate, no evidence of psychoses   Data Reviewed: CBC: Recent Labs  Lab 08/11/19 0535 08/12/19 0055 08/13/19 0100 08/14/19 0330 08/15/19 0146  WBC 4.7 14.6* 16.6* 14.9* 15.3*  NEUTROABS 3.8 13.2* 15.4* 13.3* 13.6*  HGB 12.6* 11.7* 11.5* 11.6* 12.5*  HCT 36.6* 34.8* 33.5* 33.9* 35.8*  MCV 93.4 94.6 93.3 92.9 92.3  PLT 180 189 213 212 224   Basic Metabolic Panel: Recent Labs  Lab 08/11/19 0530 08/12/19 0055 08/13/19 0100 08/13/19 2235 08/14/19 0330 08/15/19 0146  NA  --  137 139 138 135 140  K  --  3.9 3.6  3.6 4.2 3.5  CL  --  103 106 106 103 102  CO2  --  21* 25 22 23 27   GLUCOSE  --  227* 81 133* 127* 60*  BUN  --  36* 42* 41* 33* 29*  CREATININE  --  0.92 1.00 0.92 0.81 0.79  CALCIUM  --  8.1* 8.3* 8.3* 7.8* 8.5*  MG 2.0 2.0 2.2  --  2.2 2.0  PHOS 2.8 2.6 2.5  --  2.6 2.7   GFR: Estimated Creatinine Clearance: 56.3 mL/min (by C-G formula based on SCr of 0.79 mg/dL). Liver  Function Tests: Recent Labs  Lab 08/11/19 0535 08/12/19 0055 08/13/19 0100 08/14/19 0330 08/15/19 0146  AST 43* 41 32 29 25  ALT 17 17 18 24 28   ALKPHOS 66 76 93 109 121  BILITOT 0.7 1.2 0.6 1.0 0.9  PROT 6.5 5.6* 5.4* 5.0* 5.2*  ALBUMIN 2.7* 2.5* 2.3* 2.3* 2.5*   No results for input(s): LIPASE, AMYLASE in the last 168 hours. No results for input(s): AMMONIA in the last 168 hours. Coagulation Profile: No results for input(s): INR, PROTIME in the last 168 hours. Cardiac Enzymes: No results for input(s): CKTOTAL, CKMB, CKMBINDEX, TROPONINI in the last 168 hours. BNP (last 3 results) No results for input(s): PROBNP in the last 8760 hours. HbA1C: No results for input(s): HGBA1C in the last 72 hours. CBG: Recent Labs  Lab 08/14/19 1658 08/14/19 2057 08/15/19 0455 08/15/19 1149 08/15/19 1643  GLUCAP 400* 288* 150* 477* 292*   Lipid Profile: No results for input(s): CHOL, HDL, LDLCALC, TRIG, CHOLHDL, LDLDIRECT in the last 72 hours. Thyroid Function Tests: No results for input(s): TSH, T4TOTAL, FREET4, T3FREE, THYROIDAB in the last 72 hours. Anemia Panel: Recent Labs    08/14/19 0330 08/15/19 0500  FERRITIN 523* 487*   Urine analysis:    Component Value Date/Time   COLORURINE YELLOW 06/01/2019 [place] 06/01/2019 0853   LABSPEC 1.020 06/01/2019 0853   PHURINE 6.5 06/01/2019 0853   GLUCOSEU 250 (A) 06/01/2019 0853   HGBUR NEGATIVE 06/01/2019 [place] 06/01/2019 [place] 06/01/2019 0853   PROTEINUR 100 (A) 12/15/2016 1216   UROBILINOGEN 0.2 06/01/2019 0853   NITRITE NEGATIVE 06/01/2019 0853   LEUKOCYTESUR NEGATIVE 06/01/2019 0853   Sepsis Labs: @LABRCNTIP (procalcitonin:4,lacticidven:4)  ) Recent Results (from the past 240 hour(s))  Blood Culture (routine x 2)     Status: None   Collection Time: 07/22/2019  5:40 PM   Specimen: BLOOD  Result Value Ref Range Status   Specimen Description   Final    BLOOD  BLOOD LEFT WRIST Performed at [facility], [address] [place], [place] [address]    Special Requests   Final    BOTTLES DRAWN AEROBIC AND ANAEROBIC Blood Culture adequate volume   Culture   Final    NO GROWTH 5 DAYS Performed at [facility], [place] [address] [place], [place] [address]    Report Status 08/15/2019 FINAL  Final  SARS Coronavirus 2 Ag (30 min TAT) - Nasal Swab (BD Veritor Kit)     Status: Abnormal   Collection Time: 07/28/2019  5:45 PM   Specimen: Nasal Swab (BD Veritor Kit)  Result Value Ref Range Status   SARS Coronavirus 2 Ag POSITIVE (A) NEGATIVE Final    Comment: RESULT CALLED TO, READ BACK BY AND VERIFIED WITH: RUTH MARCUM RN @2036  08/01/2019 OLSONM (NOTE) SARS-CoV-2 antigen PRESENT. Positive results indicate the  presence of viral antigens, but clinical correlation with patient history and other diagnostic information is necessary to determine patient infection status.  Positive results do not rule out bacterial infection or co-infection  with other viruses. False positive results are rare but can occur, and confirmatory RT-PCR testing may be appropriate in some circumstances. The expected result is Negative. Fact Sheet for Patients: PodPark.tn Fact Sheet for Providers: GiftContent.is  This test is not yet approved or cleared by the [place] FDA and  has been authorized for detection and/or diagnosis of SARS-CoV-2 by FDA under an Emergency Use Authorization (EUA).  This EUA will remain in effect (meaning this test can be used) for the duration of  the COVID-19 decl aration under Section 564(b)(1) of the Act, 21 U.S.C. section 360bbb-3(b)(1), unless the authorization is terminated or revoked sooner. Performed at [facility], [address] [place], [place] [address]   MRSA PCR Screening     Status: Abnormal   Collection Time: 08/03/2019 10:20 PM   Specimen: Nasal  Mucosa; Nasopharyngeal  Result Value Ref Range Status   MRSA by PCR POSITIVE (A) NEGATIVE Final    Comment:        The GeneXpert MRSA Assay (FDA approved for NASAL specimens only), is one component of a comprehensive MRSA colonization surveillance program. It is not intended to diagnose MRSA infection nor to guide or monitor treatment for MRSA infections. RESULT CALLED TO, READ BACK BY AND VERIFIED WITH: York Pellant @ 4196 ON 08/11/2019 C VARNER  Performed at [facility], [place] [address], [place], [place] [address]       Studies: DG CHEST PORT 1 VIEW  Result Date: 08/15/2019 CLINICAL DATA:  Shortness of breath. EXAM: PORTABLE CHEST 1 VIEW COMPARISON:  August 11, 2019. August 10, 2019. FINDINGS: Stable cardiomediastinal silhouette. No pneumothorax or pleural effusion is noted. Stable bilateral patchy airspace opacities are noted consistent with multifocal pneumonia. Bony thorax is unremarkable. IMPRESSION: Stable bilateral patchy airspace opacities are noted consistent with multifocal pneumonia. Continued radiographic follow-up is recommended to ensure resolution. Electronically Signed   By: Marijo Conception M.D.   On: 08/15/2019 09:13    Scheduled Meds: . amLODipine  5 mg Oral Daily  . vitamin C  500 mg Oral Daily  . aspirin EC  81 mg Oral Daily  . Chlorhexidine Gluconate Cloth  6 each Topical Daily  . dexamethasone  6 mg Oral Q24H  . docusate sodium  100 mg Oral Daily  . [START ON 08/16/2019] enoxaparin (LOVENOX) injection  40 mg Subcutaneous Q24H  . insulin aspart  0-20 Units Subcutaneous TID WC  . insulin aspart  0-5 Units Subcutaneous QHS  . insulin aspart  8 Units Subcutaneous TID WC  . insulin detemir  6 Units Subcutaneous BID  . Ipratropium-Albuterol  1 puff Inhalation QID  . lisinopril  20 mg Oral Daily  . mupirocin ointment   Nasal BID  . zinc sulfate  220 mg Oral Daily    Continuous Infusions: . sodium chloride Stopped (08/02/2019 2259)  .  dextrose 5 % and 0.45% NaCl Stopped (08/14/19 0436)     LOS: 5 days     Annita Brod, MD [facility]  To reach me or the doctor on call, go to: [email or web address] Password [facility]  08/15/2019, 7:07 PM

## 2019-08-15 NOTE — Progress Notes (Signed)
Patient son Emelda Fear given update on patient.

## 2019-08-15 NOTE — Progress Notes (Signed)
Pharmacy note - Lovenox dosing  Patient on 1mg /kg Lovenox BID for possible DVT.  Dopplers done yesterday are negative.  D-dimer <5.  Asked Dr. about decreasing to prophylaxis dosing.  Orders received.  Rito Ehrlich, PharmD, BCPS-AQ ID Clinical Pharmacist

## 2019-08-15 NOTE — Plan of Care (Signed)

## 2019-08-16 LAB — BASIC METABOLIC PANEL
Anion gap: 11 (ref 5–15)
BUN: 35 mg/dL — ABNORMAL HIGH (ref 8–23)
CO2: 29 mmol/L (ref 22–32)
Calcium: 8.9 mg/dL (ref 8.9–10.3)
Chloride: 98 mmol/L (ref 98–111)
Creatinine, Ser: 0.94 mg/dL (ref 0.61–1.24)
GFR calc Af Amer: >60 mL/min (ref >60)
GFR calc non Af Amer: >60 mL/min (ref >60)
Glucose, Bld: 165 mg/dL — ABNORMAL HIGH (ref 70–99)
Potassium: 3.9 mmol/L (ref 3.5–5.1)
Sodium: 138 mmol/L (ref 135–145)

## 2019-08-16 LAB — GLUCOSE, CAPILLARY
Glucose-Capillary: 146 mg/dL — ABNORMAL HIGH (ref 70–99)
Glucose-Capillary: 307 mg/dL — ABNORMAL HIGH (ref 70–99)
Glucose-Capillary: 211 mg/dL — ABNORMAL HIGH (ref 70–99)
Glucose-Capillary: 432 mg/dL — ABNORMAL HIGH (ref 70–99)
Glucose-Capillary: 245 mg/dL — ABNORMAL HIGH (ref 70–99)
Glucose-Capillary: 277 mg/dL — ABNORMAL HIGH (ref 70–99)

## 2019-08-16 LAB — BRAIN NATRIURETIC PEPTIDE: B Natriuretic Peptide: 68.7 pg/mL (ref 0.0–100.0)

## 2019-08-16 LAB — PHOSPHORUS: Phosphorus: 4.2 mg/dL (ref 2.5–4.6)

## 2019-08-16 LAB — MAGNESIUM: Magnesium: 2.1 mg/dL (ref 1.7–2.4)

## 2019-08-16 MED ORDER — INSULIN ASPART 100 UNIT/ML ~~LOC~~ SOLN
10.0000 [IU] | Freq: Three times a day (TID) | SUBCUTANEOUS | Status: DC
  Administered 2019-08-16 – 2019-08-20 (×12): 10 [IU] via SUBCUTANEOUS

## 2019-08-16 MED ORDER — INSULIN DETEMIR 100 UNIT/ML ~~LOC~~ SOLN
8.0000 [IU] | Freq: Two times a day (BID) | SUBCUTANEOUS | Status: DC
  Administered 2019-08-16 – 2019-08-19 (×6): 8 [IU] via SUBCUTANEOUS
  Filled 2019-08-16 (×7): qty 0.08

## 2019-08-16 MED ORDER — INSULIN DETEMIR 100 UNIT/ML ~~LOC~~ SOLN
10.0000 [IU] | Freq: Two times a day (BID) | SUBCUTANEOUS | Status: DC
  Filled 2019-08-16: qty 0.1

## 2019-08-16 MED ORDER — INSULIN GLARGINE 100 UNIT/ML ~~LOC~~ SOLN
3.0000 [IU] | SUBCUTANEOUS | Status: AC
  Administered 2019-08-16: 3 [IU] via SUBCUTANEOUS
  Filled 2019-08-16: qty 0.03

## 2019-08-16 NOTE — Progress Notes (Signed)
PROGRESS NOTE  Aaron Blair ZOX:[id number] DOB: 1934/05/31 DOA: 08/02/2019 PCP: No primary care provider on file.  HPI/Recap of past 13 hours: 86 year old [place] speaking male with past medical history of poorly controlled diabetes mellitus, hypertension admitted on 12/29 after coming in with 1 week of subjective fever and poor p.o. intake and 1 day of shortness of breath.  The emergency room, found to be hypoxic and positive for Covid.  Following admission, patient treated with Actemra, Decadron and remdesivir.  A1c elevated at 9.8 and during patient's hospitalization, insulin coverage has been titrated up.  Noted to have elevated troponins, felt to be in the setting of mild demand ischemia, but no evidence of ACS.  Echocardiogram unrevealing.  Patient still requiring high flow oxygen, currently at 15 L.  He himself is comfortable and has no complaints.  In fact, he states that he feels fine with no pain and no shortness of breath and wants to be able to leave soon.  Assessment/Plan: Principal Problem:   Acute hypoxemic respiratory failure due to severe acute respiratory syndrome coronavirus 2 (SARS-CoV-2) disease ([place]): Continue IV steroids.  Has completed Actemra and remdesivir. I had explained to him through his translator, that he is still on a significant amount of oxygen and that this would need to be titrated down slowly so that he could get better and go home.  He did not seem to like this, but was willing to accept it. Active Problems:   Chronic diastolic CHF (congestive heart failure) ([place]): Stopped IV fluids on 1/3.  BNP on 1/4 normal.  Following daily weights.  Patient looks to be -4 L from admission.    Hypertension: Blood pressure stable    Uncontrolled type 2 diabetes mellitus with complication [facility]): Titrating up his Lantus and scheduled NovoLog accordingly.  Patient has been on dextrose in his IV fluids which we have discontinued.    Elevated troponin, felt to be secondary  to demand ischemia: Stable.  No evidence of ACS.  Echocardiogram results as below.  Code Status: DNR  Family Communication: Left message for son  Disposition Plan: Home once able to wean off of oxygen   Consultants:  None  Procedures:  Echocardiogram done 12/30: Preserved ejection fraction, diastolic dysfunction.  Lower extremity Dopplers done 1/2: No evidence of DVT.  Antimicrobials:  IV Remdisivir 12/29-1/2  IV Actemra  DVT prophylaxis:  Lovenox   Objective: Vitals:   08/16/19 0725 08/16/19 1139  BP: (!) 147/73 128/60  Pulse: 94 (!) 103  Resp: 19 19  Temp: 98.1 F (36.7 C) 97.9 F (36.6 C)  SpO2: 95% 94%    Intake/Output Summary (Last 24 hours) at 08/16/2019 1505 Last data filed at 08/16/2019 1400 Gross per 24 hour  Intake 360 ml  Output 1400 ml  Net -1040 ml   Filed Weights   07/23/2019 1711  Weight: 63.5 kg   Body mass index is 24.8 kg/m.  Exam:   General: Alert and oriented x3, no acute distress  HEENT: Normocephalic and atraumatic, mucous membranes are moist  Neck: Supple, no JVD  Cardiovascular: Regular rate and rhythm, S1-S2  Respiratory: Clear auscultation bilaterally  Abdomen: Soft, nontender, nondistended, positive bowel sounds  Musculoskeletal: No clubbing or cyanosis or edema  Skin: No skin breaks, tears or lesions  Neuro: No focal deficits  Psychiatry: Appropriate, no evidence of psychoses   Data Reviewed: CBC: Recent Labs  Lab 08/11/19 0535 08/12/19 0055 08/13/19 0100 08/14/19 0330 08/15/19 0146  WBC 4.7 14.6* 16.6* 14.9* 15.3*  NEUTROABS 3.8 13.2* 15.4* 13.3* 13.6*  HGB 12.6* 11.7* 11.5* 11.6* 12.5*  HCT 36.6* 34.8* 33.5* 33.9* 35.8*  MCV 93.4 94.6 93.3 92.9 92.3  PLT 180 189 213 212 758   Basic Metabolic Panel: Recent Labs  Lab 08/12/19 0055 08/13/19 0100 08/13/19 2235 08/14/19 0330 08/15/19 0146 08/16/19 0245  NA 137 139 138 135 140 138  K 3.9 3.6 3.6 4.2 3.5 3.9  CL 103 106 106 103 102 98  CO2 21*  25 22 23 27 29   GLUCOSE 227* 81 133* 127* 60* 165*  BUN 36* 42* 41* 33* 29* 35*  CREATININE 0.92 1.00 0.92 0.81 0.79 0.94  CALCIUM 8.1* 8.3* 8.3* 7.8* 8.5* 8.9  MG 2.0 2.2  --  2.2 2.0 2.1  PHOS 2.6 2.5  --  2.6 2.7 4.2   GFR: Estimated Creatinine Clearance: 47.9 mL/min (by C-G formula based on SCr of 0.94 mg/dL). Liver Function Tests: Recent Labs  Lab 08/11/19 0535 08/12/19 0055 08/13/19 0100 08/14/19 0330 08/15/19 0146  AST 43* 41 32 29 25  ALT 17 17 18 24 28   ALKPHOS 66 76 93 109 121  BILITOT 0.7 1.2 0.6 1.0 0.9  PROT 6.5 5.6* 5.4* 5.0* 5.2*  ALBUMIN 2.7* 2.5* 2.3* 2.3* 2.5*   No results for input(s): LIPASE, AMYLASE in the last 168 hours. No results for input(s): AMMONIA in the last 168 hours. Coagulation Profile: No results for input(s): INR, PROTIME in the last 168 hours. Cardiac Enzymes: No results for input(s): CKTOTAL, CKMB, CKMBINDEX, TROPONINI in the last 168 hours. BNP (last 3 results) No results for input(s): PROBNP in the last 8760 hours. HbA1C: No results for input(s): HGBA1C in the last 72 hours. CBG: Recent Labs  Lab 08/15/19 1149 08/15/19 1643 08/15/19 2101 08/16/19 0719 08/16/19 1146  GLUCAP 477* 292* 351* 211* 432*   Lipid Profile: No results for input(s): CHOL, HDL, LDLCALC, TRIG, CHOLHDL, LDLDIRECT in the last 72 hours. Thyroid Function Tests: No results for input(s): TSH, T4TOTAL, FREET4, T3FREE, THYROIDAB in the last 72 hours. Anemia Panel: Recent Labs    08/14/19 0330 08/15/19 0500  FERRITIN 523* 487*   Urine analysis:    Component Value Date/Time   COLORURINE YELLOW 06/01/2019 [place] 06/01/2019 0853   LABSPEC 1.020 06/01/2019 0853   PHURINE 6.5 06/01/2019 0853   GLUCOSEU 250 (A) 06/01/2019 0853   HGBUR NEGATIVE 06/01/2019 [place] 06/01/2019 [place] 06/01/2019 0853   PROTEINUR 100 (A) 12/15/2016 1216   UROBILINOGEN 0.2 06/01/2019 0853   NITRITE NEGATIVE 06/01/2019  0853   LEUKOCYTESUR NEGATIVE 06/01/2019 0853   Sepsis Labs: @LABRCNTIP (procalcitonin:4,lacticidven:4)  ) Recent Results (from the past 240 hour(s))  Blood Culture (routine x 2)     Status: None   Collection Time: 07/18/2019  5:40 PM   Specimen: BLOOD  Result Value Ref Range Status   Specimen Description   Final    BLOOD BLOOD LEFT WRIST Performed at [facility], [address] [place], [place] [address]    Special Requests   Final    BOTTLES DRAWN AEROBIC AND ANAEROBIC Blood Culture adequate volume   Culture   Final    NO GROWTH 5 DAYS Performed at [facility], [place] [address] [place], [place] [address]    Report Status 08/15/2019 FINAL  Final  SARS Coronavirus 2 Ag (30 min TAT) - Nasal Swab (BD Veritor Kit)     Status: Abnormal   Collection  Time: 08/01/2019  5:45 PM   Specimen: Nasal Swab (BD Veritor Kit)  Result Value Ref Range Status   SARS Coronavirus 2 Ag POSITIVE (A) NEGATIVE Final    Comment: RESULT CALLED TO, READ BACK BY AND VERIFIED WITH: Ilda Basset RN @2036  08/11/2019 OLSONM (NOTE) SARS-CoV-2 antigen PRESENT. Positive results indicate the presence of viral antigens, but clinical correlation with patient history and other diagnostic information is necessary to determine patient infection status.  Positive results do not rule out bacterial infection or co-infection  with other viruses. False positive results are rare but can occur, and confirmatory RT-PCR testing may be appropriate in some circumstances. The expected result is Negative. Fact Sheet for Patients: PodPark.tn Fact Sheet for Providers: GiftContent.is  This test is not yet approved or cleared by the [place] FDA and  has been authorized for detection and/or diagnosis of SARS-CoV-2 by FDA under an Emergency Use Authorization (EUA).  This EUA will remain in effect (meaning this test can be used) for the duration of  the  COVID-19 decl aration under Section 564(b)(1) of the Act, 21 U.S.C. section 360bbb-3(b)(1), unless the authorization is terminated or revoked sooner. Performed at [facility], [address] [place], [place] [address]   MRSA PCR Screening     Status: Abnormal   Collection Time: 08/09/2019 10:20 PM   Specimen: Nasal Mucosa; Nasopharyngeal  Result Value Ref Range Status   MRSA by PCR POSITIVE (A) NEGATIVE Final    Comment:        The GeneXpert MRSA Assay (FDA approved for NASAL specimens only), is one component of a comprehensive MRSA colonization surveillance program. It is not intended to diagnose MRSA infection nor to guide or monitor treatment for MRSA infections. RESULT CALLED TO, READ BACK BY AND VERIFIED WITH: York Pellant @ 8841 ON 08/11/2019 C VARNER  Performed at [facility], [place] [address] [place], [place] [address]       Studies: No results found.  Scheduled Meds: . amLODipine  5 mg Oral Daily  . vitamin C  500 mg Oral Daily  . aspirin EC  81 mg Oral Daily  . Chlorhexidine Gluconate Cloth  6 each Topical Daily  . dexamethasone  6 mg Oral Q24H  . docusate sodium  100 mg Oral Daily  . enoxaparin (LOVENOX) injection  40 mg Subcutaneous Q24H  . insulin aspart  0-20 Units Subcutaneous TID WC  . insulin aspart  0-5 Units Subcutaneous QHS  . insulin aspart  10 Units Subcutaneous TID WC  . insulin detemir  8 Units Subcutaneous BID  . Ipratropium-Albuterol  1 puff Inhalation QID  . lisinopril  20 mg Oral Daily  . mupirocin ointment   Nasal BID  . zinc sulfate  220 mg Oral Daily    Continuous Infusions: . sodium chloride Stopped (07/23/2019 2259)     LOS: 6 days     Annita Brod, MD [facility]  To reach me or the doctor on call, go to: [email or web address] Password [facility]  08/16/2019, 3:05 PM

## 2019-08-16 NOTE — Progress Notes (Signed)
Patient's son Emelda Fear called and given update.

## 2019-08-16 NOTE — Plan of Care (Signed)

## 2019-08-16 NOTE — Plan of Care (Addendum)
COVID plan of care initiated. Patient occationally pulling NRB off during the night with sats going into 70's. 100% NRB placed back on and 02 sats slowly recover into low to mid 90's. Patient remained alert and in NAD. Tele cont. NSR

## 2019-08-16 NOTE — Progress Notes (Signed)
Call received from patient's daughter to check on him.  I reviewed patient's status and recent results.  Patient spoke with daughter twice on my phone.  I let daughter know pt discharge is contingent on reducing his oxygen requirements.  She verbalized understanding and appreciation.

## 2019-08-16 NOTE — Progress Notes (Signed)
Inpatient Diabetes Program Recommendations  AACE/ADA: New Consensus Statement on Inpatient Glycemic Control (2015)  Target Ranges:  Prepandial:   less than 140 mg/dL      Peak postprandial:   less than 180 mg/dL (1-2 hours)      Critically ill patients:  140 - 180 mg/dL   Lab Results  Component Value Date   GLUCAP 211 (H) 08/16/2019   HGBA1C 9.4 (H) 08/11/2019    Review of Glycemic Control  Post-prandials above goal of 140-180 mg/dL while on steroids.  Inpatient Diabetes Program Recommendations:     Increase Novolog to 10 units tidwc for meal coverage insulin if pt eats > 50% meal.  Continue to follow.  Thank you. Ailene Ards, RD, LDN, CDE Inpatient Diabetes Coordinator [phone]

## 2019-08-17 LAB — BASIC METABOLIC PANEL
Anion gap: 8 (ref 5–15)
BUN: 32 mg/dL — ABNORMAL HIGH (ref 8–23)
CO2: 30 mmol/L (ref 22–32)
Calcium: 8.7 mg/dL — ABNORMAL LOW (ref 8.9–10.3)
Chloride: 97 mmol/L — ABNORMAL LOW (ref 98–111)
Creatinine, Ser: 0.8 mg/dL (ref 0.61–1.24)
GFR calc Af Amer: >60 mL/min (ref >60)
GFR calc non Af Amer: >60 mL/min (ref >60)
Glucose, Bld: 96 mg/dL (ref 70–99)
Potassium: 4.1 mmol/L (ref 3.5–5.1)
Sodium: 135 mmol/L (ref 135–145)

## 2019-08-17 LAB — MAGNESIUM: Magnesium: 2 mg/dL (ref 1.7–2.4)

## 2019-08-17 LAB — GLUCOSE, CAPILLARY
Glucose-Capillary: 179 mg/dL — ABNORMAL HIGH (ref 70–99)
Glucose-Capillary: 309 mg/dL — ABNORMAL HIGH (ref 70–99)
Glucose-Capillary: 371 mg/dL — ABNORMAL HIGH (ref 70–99)

## 2019-08-17 LAB — PHOSPHORUS: Phosphorus: 4.1 mg/dL (ref 2.5–4.6)

## 2019-08-17 NOTE — Significant Event (Addendum)
Received call from Telemetry that pt was unhooked. Went in to check on pt found pt had removed HFNC, NRB, cardiac and pulse oximetry monitoring. Pt was partially dressed stating he fine, he go. I used video interpreter Riley Lam [phone]. Pt stated he doesn't feel bad, no pain, no shortness of breath, that he is going home. I explained that he needed supplemental oxygen to get to right amount of oxygen to live. That when he takes off his oxygen he oxygen levels in his body drops to dangerous life threatening levels, weather he feels shortness of breath or not the levels can not sustain life and he could do brain damage and die. Replaced pt on HFNC @15LPM . Pt continued to state he is fine that he is going home. I placed pulse oximetry and cardiac monitor back on pt. Explained that his level with the just HFNC back on was still 86%, that he needed to wear the NRB mask also. Oxygen saturation with both NRB mask and HFNC was 93% at rest in bed. Pt stated that doctor had not been to see him in days, requested doctor to come see pt-doctor on way. I again explained a normal level of oxygen saturation was 95-100% then what his level was on HFNC at 15LPM, explained the risk of brain damage from lack of oxygen and risk of death. Also advised pt his blood glucose was high at 371 and that he was getting insulin for it. Explained that he was getting at least 5 injections a day of both Novolog and long acting insulin Levemir.Pt son called and spoke to pt, pt still wanting to leave. Waiting on doctor to arrive.

## 2019-08-17 NOTE — Progress Notes (Addendum)
PROGRESS NOTE  Aaron Blair YSA:[id number] DOB: 1934/02/15 DOA: 08/11/2019 PCP: No primary care provider on file.  HPI/Recap of past 8 hours: 86 year old [place] speaking male with past medical history of poorly controlled diabetes mellitus, hypertension admitted on 12/29 after coming in with 1 week of subjective fever and poor p.o. intake and 1 day of shortness of breath.  The emergency room, found to be hypoxic and positive for Covid.  Following admission, patient treated with Actemra, Decadron and remdesivir.  A1c elevated at 9.8 and during patient's hospitalization, insulin coverage has been titrated up.  Noted to have elevated troponins, felt to be in the setting of mild demand ischemia, but no evidence of ACS.  Echocardiogram unrevealing.  Patient slowly improving.  Today, he is at 15 L high flow nasal cannula only, wean down from high flow nasal cannula plus nonrebreather mask. (Through translator) he has no complaints.  Addendum: This evening, patient took off high flow oxygen and leads and wanted to leave.  He stated that he was feeling fine and wanted to go home.  Stayed with him as we fully removed his oxygen and within a minute, his oxygen levels were down into the 70s and he was feeling a little lightheaded.  He then sat back and we were able to replace his oxygen.  I then used translator to explain to him why it was important that he need his oxygen right now and that he is not stable to go home.  I think after seeing how he felt off the oxygen, he now agrees.  I asked if there was anything we could do to make him more comfortable while he was here, he declined.  He seemed contrite.  Assessment/Plan: Principal Problem:   Acute hypoxemic respiratory failure due to severe acute respiratory syndrome coronavirus 2 (SARS-CoV-2) disease ([place]): Continue IV steroids.  Has completed Actemra and remdesivir. I had explained to him through his translator, that he is still on a significant amount  of oxygen and that this would need to be titrated down slowly so that he could get better and go home.  He did not seem to like this, but was willing to accept it.  Starting to wean down on oxygen.  Active Problems: Acute on chronic diastolic CHF (congestive heart failure) ([place]): Stopped IV fluids on 1/3.  BNP on 1/4 normal.  Following daily weights.  Patient looks to be down almost 6 L from admission.  Renal function looks fine, we will continue to aggressively diurese    Hypertension: Blood pressure stable    Uncontrolled type 2 diabetes mellitus with complication [facility]): Titrating up his Lantus and scheduled NovoLog accordingly.  Patient has been on dextrose in his IV fluids which we have discontinued.    Elevated troponin, felt to be secondary to heart failure: Stable.  No evidence of ACS or ischemia.  Echocardiogram results as below.  Code Status: DNR  Family Communication: Updated son by phone  Disposition Plan: Home once able to wean off of oxygen   Consultants:  None  Procedures:  Echocardiogram done 12/30: Preserved ejection fraction, diastolic dysfunction.  Lower extremity Dopplers done 1/2: No evidence of DVT.  Antimicrobials:  IV Remdisivir 12/29-1/2  IV Actemra  DVT prophylaxis:  Lovenox   Objective: Vitals:   08/17/19 0834 08/17/19 0924  BP: (!) 143/70 (!) 118/55  Pulse:  98  Resp:  16  Temp:  97.6 F (36.4 C)  SpO2:  (!) 89%    Intake/Output Summary (Last  24 hours) at 08/17/2019 1522 Last data filed at 08/17/2019 0923 Gross per 24 hour  Intake 570 ml  Output 725 ml  Net -155 ml   Filed Weights   08/05/2019 1711  Weight: 63.5 kg   Body mass index is 24.8 kg/m.  Exam:   General: Alert and oriented x3, no acute distress  HEENT: Normocephalic and atraumatic, mucous membranes are moist  Neck: Supple, no JVD  Cardiovascular: Regular rate and rhythm, S1-S2  Respiratory: Clear auscultation bilaterally  Abdomen: Soft, nontender, nondistended,  positive bowel sounds  Musculoskeletal: No clubbing or cyanosis, trace pitting edema  Skin: No skin breaks, tears or lesions  Neuro: No focal deficits  Psychiatry: Appropriate, no evidence of psychoses   Data Reviewed: CBC: Recent Labs  Lab 08/11/19 0535 08/12/19 0055 08/13/19 0100 08/14/19 0330 08/15/19 0146  WBC 4.7 14.6* 16.6* 14.9* 15.3*  NEUTROABS 3.8 13.2* 15.4* 13.3* 13.6*  HGB 12.6* 11.7* 11.5* 11.6* 12.5*  HCT 36.6* 34.8* 33.5* 33.9* 35.8*  MCV 93.4 94.6 93.3 92.9 92.3  PLT 180 189 213 212 990   Basic Metabolic Panel: Recent Labs  Lab 08/13/19 0100 08/13/19 2235 08/14/19 0330 08/15/19 0146 08/16/19 0245 08/17/19 0205  NA 139 138 135 140 138 135  K 3.6 3.6 4.2 3.5 3.9 4.1  CL 106 106 103 102 98 97*  CO2 _0 GLUCOSE 81 133* 127* 60* 165* 96  BUN 42* 41* 33* 29* 35* 32*  CREATININE 1.00 0.92 0.81 0.79 0.94 0.80  CALCIUM 8.3* 8.3* 7.8* 8.5* 8.9 8.7*  MG 2.2  --  2.2 2.0 2.1 2.0  PHOS 2.5  --  2.6 2.7 4.2 4.1   GFR: Estimated Creatinine Clearance: 56.3 mL/min (by C-G formula based on SCr of 0.8 mg/dL). Liver Function Tests: Recent Labs  Lab 08/11/19 0535 08/12/19 0055 08/13/19 0100 08/14/19 0330 08/15/19 0146  AST 43* 41 32 29 25  ALT _1 ALKPHOS 66 76 93 109 121  BILITOT 0.7 1.2 0.6 1.0 0.9  PROT 6.5 5.6* 5.4* 5.0* 5.2*  ALBUMIN 2.7* 2.5* 2.3* 2.3* 2.5*   No results for input(s): LIPASE, AMYLASE in the last 168 hours. No results for input(s): AMMONIA in the last 168 hours. Coagulation Profile: No results for input(s): INR, PROTIME in the last 168 hours. Cardiac Enzymes: No results for input(s): CKTOTAL, CKMB, CKMBINDEX, TROPONINI in the last 168 hours. BNP (last 3 results) No results for input(s): PROBNP in the last 8760 hours. HbA1C: No results for input(s): HGBA1C in the last 72 hours. CBG: Recent Labs  Lab 08/16/19 1146 08/16/19 1637 08/16/19 2018 08/17/19 0757 08/17/19 1149  GLUCAP 432* 245* 277*  179* 309*   Lipid Profile: No results for input(s): CHOL, HDL, LDLCALC, TRIG, CHOLHDL, LDLDIRECT in the last 72 hours. Thyroid Function Tests: No results for input(s): TSH, T4TOTAL, FREET4, T3FREE, THYROIDAB in the last 72 hours. Anemia Panel: Recent Labs    08/15/19 0500  FERRITIN 487*   Urine analysis:    Component Value Date/Time   COLORURINE YELLOW 06/01/2019 0853   APPEARANCEUR CLEAR 06/01/2019 0853   LABSPEC 1.020 06/01/2019 0853   PHURINE 6.5 06/01/2019 0853   GLUCOSEU 250 (A) 06/01/2019 0853   HGBUR NEGATIVE 06/01/2019 0853   BILIRUBINUR NEGATIVE 06/01/2019 0853   KETONESUR NEGATIVE 06/01/2019 0853   PROTEINUR 100 (A) 12/15/2016 1216   UROBILINOGEN 0.2 06/01/2019 0853   NITRITE NEGATIVE 06/01/2019 0853   LEUKOCYTESUR NEGATIVE 06/01/2019 0853   Sepsis Labs: @  LABRCNTIP(procalcitonin:4,lacticidven:4)  ) Recent Results (from the past 240 hour(s))  Blood Culture (routine x 2)     Status: None   Collection Time: 07/31/2019  5:40 PM   Specimen: BLOOD  Result Value Ref Range Status   Specimen Description   Final    BLOOD BLOOD LEFT WRIST Performed at [facility], [address] [place], [place] [address]    Special Requests   Final    BOTTLES DRAWN AEROBIC AND ANAEROBIC Blood Culture adequate volume   Culture   Final    NO GROWTH 5 DAYS Performed at [facility], [place] [address] [place], [place] [address]    Report Status 08/15/2019 FINAL  Final  SARS Coronavirus 2 Ag (30 min TAT) - Nasal Swab (BD Veritor Kit)     Status: Abnormal   Collection Time: 08/06/2019  5:45 PM   Specimen: Nasal Swab (BD Veritor Kit)  Result Value Ref Range Status   SARS Coronavirus 2 Ag POSITIVE (A) NEGATIVE Final    Comment: RESULT CALLED TO, READ BACK BY AND VERIFIED WITH: Ilda Basset RN '@2036'$  08/11/2019 OLSONM (NOTE) SARS-CoV-2 antigen PRESENT. Positive results indicate the presence of viral antigens, but clinical correlation with patient history and other  diagnostic information is necessary to determine patient infection status.  Positive results do not rule out bacterial infection or co-infection  with other viruses. False positive results are rare but can occur, and confirmatory RT-PCR testing may be appropriate in some circumstances. The expected result is Negative. Fact Sheet for Patients: PodPark.tn Fact Sheet for Providers: GiftContent.is  This test is not yet approved or cleared by the [place] FDA and  has been authorized for detection and/or diagnosis of SARS-CoV-2 by FDA under an Emergency Use Authorization (EUA).  This EUA will remain in effect (meaning this test can be used) for the duration of  the COVID-19 decl aration under Section 564(b)(1) of the Act, 21 U.S.C. section 360bbb-3(b)(1), unless the authorization is terminated or revoked sooner. Performed at [facility], [address] [place], [place] [address]   MRSA PCR Screening     Status: Abnormal   Collection Time: 07/21/2019 10:20 PM   Specimen: Nasal Mucosa; Nasopharyngeal  Result Value Ref Range Status   MRSA by PCR POSITIVE (A) NEGATIVE Final    Comment:        The GeneXpert MRSA Assay (FDA approved for NASAL specimens only), is one component of a comprehensive MRSA colonization surveillance program. It is not intended to diagnose MRSA infection nor to guide or monitor treatment for MRSA infections. RESULT CALLED TO, READ BACK BY AND VERIFIED WITH: York Pellant @ 3151 ON 08/11/2019 C VARNER  Performed at [facility], [place] [address] [place], [place] [address]       Studies: No results found.  Scheduled Meds: . amLODipine  5 mg Oral Daily  . vitamin C  500 mg Oral Daily  . aspirin EC  81 mg Oral Daily  . Chlorhexidine Gluconate Cloth  6 each Topical Daily  . dexamethasone  6 mg Oral Q24H  . docusate sodium  100 mg Oral Daily  . enoxaparin (LOVENOX)  injection  40 mg Subcutaneous Q24H  . insulin aspart  0-20 Units Subcutaneous TID WC  . insulin aspart  0-5 Units Subcutaneous QHS  . insulin aspart  10 Units Subcutaneous TID WC  . insulin detemir  8 Units Subcutaneous BID  . Ipratropium-Albuterol  1 puff Inhalation QID  .  lisinopril  20 mg Oral Daily  . mupirocin ointment   Nasal BID  . zinc sulfate  220 mg Oral Daily    Continuous Infusions: . sodium chloride Stopped (07/13/2019 2259)     LOS: 7 days     Annita Brod, MD [facility]  To reach me or the doctor on call, go to: [email or web address] Password [facility]  08/17/2019, 3:22 PM

## 2019-08-17 NOTE — Progress Notes (Signed)
Pt refused to have family updated

## 2019-08-17 NOTE — Plan of Care (Addendum)
Patient verbalizes understanding of COVID. Does need occational reinforcement.  Patient desated to 61%. Upon entering room, patient lying in bed, lips cynotic, HFNC 02 and NRB off lying besides him. Patient arouses easily after name called. 02 placed back on patient with 15L HFNC and 100% NRB and slowly 02 sats back up into low 90's. Instructed patient not to take off 02 supply to him, nodded head, but when asked what not to do patient is unable to recall.  2400 Patient continues to pull 02 off intermittently. Remains confused. Mittens and 02 applied.  0200 Patient resting quietly. 02 sats 95-100% on 15L HFNC and NRB. Will continue to monitor.  0300 Patient pulled mittens off. Patient disoriented to situation at present, more alert.  0510 Patient up besides bed using urinal. 02 off and tele monitor off. Assisted back to bed. Tele reapplied and showing SR. Patient alert and follows commands. 02 at 15L HFNC applied and sat mid 90. NRB off at present.  Patient continues on 15L HFNC and sats remain around 90%. NAD noted.

## 2019-08-18 ENCOUNTER — Encounter: Payer: Medicare Other | Admitting: Family Medicine

## 2019-08-18 LAB — PHOSPHORUS: Phosphorus: 3.3 mg/dL (ref 2.5–4.6)

## 2019-08-18 LAB — MAGNESIUM: Magnesium: 2.5 mg/dL — ABNORMAL HIGH (ref 1.7–2.4)

## 2019-08-18 LAB — BASIC METABOLIC PANEL
Anion gap: 9 (ref 5–15)
BUN: 47 mg/dL — ABNORMAL HIGH (ref 8–23)
CO2: 23 mmol/L (ref 22–32)
Calcium: 9.6 mg/dL (ref 8.9–10.3)
Chloride: 119 mmol/L — ABNORMAL HIGH (ref 98–111)
Creatinine, Ser: 1.22 mg/dL (ref 0.61–1.24)
GFR calc Af Amer: >60 mL/min (ref >60)
GFR calc non Af Amer: 54 mL/min — ABNORMAL LOW (ref >60)
Glucose, Bld: 114 mg/dL — ABNORMAL HIGH (ref 70–99)
Potassium: 4.2 mmol/L (ref 3.5–5.1)
Sodium: 151 mmol/L — ABNORMAL HIGH (ref 135–145)

## 2019-08-18 LAB — GLUCOSE, CAPILLARY
Glucose-Capillary: 187 mg/dL — ABNORMAL HIGH (ref 70–99)
Glucose-Capillary: 154 mg/dL — ABNORMAL HIGH (ref 70–99)
Glucose-Capillary: 179 mg/dL — ABNORMAL HIGH (ref 70–99)
Glucose-Capillary: 230 mg/dL — ABNORMAL HIGH (ref 70–99)
Glucose-Capillary: 247 mg/dL — ABNORMAL HIGH (ref 70–99)
Glucose-Capillary: 241 mg/dL — ABNORMAL HIGH (ref 70–99)

## 2019-08-18 NOTE — Progress Notes (Signed)
PROGRESS NOTE    Aaron Blair  JQG:[id number] DOB: Apr 17, 1934 DOA: 07/27/2019 PCP: No primary care provider on file.   Brief Narrative:  ([place] speaker) 86 y.o.  Asian male, PMHx  diabetes type II uncontrolled with complication,  HTN, HLD   Presents as a transfer from [facility] for management of respiratory failure with hypoxia.  Patient does not speak [place], history obtained from family over the phone.  Over the past week patient has had subjective fevers with chills, poor appetite and started having shortness of breath with lethargy today prompting the ER presentation.  In the ER he was found to have oxygen saturation in the 50s on room air, CXR consistent with viral pneumonia, SARS-CoV-2 positive.  Patient transferred for further management.  No reported nausea, vomiting, diarrhea.  He has intermittent cough which is nonproductive.  No reported chest pain, palpitations, orthopnea, PND, lower extremity edema.    Subjective: 1/6 febrile last 24 hours, A/O x4, negative CP, negative abdominal pain.  Positive S OB    Assessment & Plan:   Principal Problem:   Acute hypoxemic respiratory failure due to severe acute respiratory syndrome coronavirus 2 (SARS-CoV-2) disease (HCC) Active Problems:   Chronic diastolic CHF (congestive heart failure) (HCC)   Hypertension   Hyperlipidemia   Demand ischemia ([place])   Pneumonia due to COVID-19 virus   Uncontrolled type 2 diabetes mellitus with complication (HCC)   Elevated troponin   Essential hypertension  Covid pneumonia/acute respiratory failure with hypoxia COVID-19 Labs No results for input(s): DDIMER, FERRITIN, LDH, CRP in the last 72 hours.  12/29 SARS coronavirus positive  -Decadron 6 mg Daily -Remdesivir per pharmacy protocol -Combivent QID  -vitamins per Covid protocol -Flutter valve -Incentive spirometry -12/30 Actemra x1 dose -1/2 Lasix IV 60 mg x 2 dose  Diabetes type 2 uncontrolled with complication -19/75 hemoglobin A1c=  9.8 -1/1 Endo tool until patient's CBG back under control -1/2 Levemir 6 BID -1/2 NovoLog 8 units qac -1/2 resistant SSI  Elevated D-dimer -CTA PE protocol negative see results below -Bilateral lower extremity Doppler negative DVT  Chest pain/elevated troponin/demand ischemia -EKG pending -Eechocardiogram; not consistent with ACS/MI see results below  Essential HTN -Amlodipine 5 mg daily -Lisinopril lisinopril 20 mg daily  Hypokalemia -Potassium goal> 4    DVT prophylaxis: Lovenox Code Status: DNR Family Communication: 08/14/2019 spoke with Leigh Aurora (son) explained plan of care answered all questions Disposition Plan: TBD   Consultants:    Procedures/Significant Events:  12/30 Echocardiogram Left Ventricle: EV= 65 to 70%. - moderately increased LVH Aortic Valve: Severely calcified AoV with focal calcification of the NCC. Restricted movement of the [facility] 12/30 CTA chest PE protocol;.-Extensive ground-glass opacities in keeping with history of COVID-19 positivity. - Motion degraded study with no evidence of pulmonary embolism. 1/2 bilateral lower extremity Doppler; negative DVT 1/3 PCXR;   I have personally reviewed and interpreted all radiology studies and my findings are as above.  VENTILATOR SETTINGS: HFNC 1/6 Flow; 15 L/min  SPO2 91%   Cultures   Antimicrobials: Anti-infectives (From admission, onward)   Start     Dose/Rate Stop   08/11/19 1000  remdesivir 100 mg in sodium chloride 0.9 % 100 mL IVPB     100 mg 200 mL/hr over 30 Minutes 08/15/19 0959   07/22/2019 2230  remdesivir 200 mg in sodium chloride 0.9% 250 mL IVPB     200 mg 580 mL/hr over 30 Minutes 08/06/2019 2251   07/26/2019 1745  cefTRIAXone (ROCEPHIN) 1 g in sodium chloride  0.9 % 100 mL IVPB     1 g 200 mL/hr over 30 Minutes 07/20/2019 1945   08/11/2019 1745  azithromycin (ZITHROMAX) 500 mg in sodium chloride 0.9 % 250 mL IVPB     500 mg 250 mL/hr over 60 Minutes 07/20/2019 2057        Devices    LINES / TUBES:      Continuous Infusions: . sodium chloride Stopped (08/06/2019 2259)     Objective: Vitals:   08/18/19 0500 08/18/19 0800 08/18/19 1148 08/18/19 1233  BP: (!) 153/80 139/71  134/87  Pulse:  91 (!) 103 95  Resp: _0 Temp: (!) 97.5 F (36.4 C) 97.9 F (36.6 C)  98.2 F (36.8 C)  TempSrc: Axillary Oral  Oral  SpO2:  95% 91% 94%  Weight:      Height:        Intake/Output Summary (Last 24 hours) at 08/18/2019 1425 Last data filed at 08/18/2019 0800 Gross per 24 hour  Intake 1126 ml  Output 1450 ml  Net -324 ml   Filed Weights   07/16/2019 1711  Weight: 63.5 kg   Physical Exam:  General: A/O x4, positive acute respiratory distress Eyes: negative scleral hemorrhage, negative anisocoria, negative icterus ENT: Negative Runny nose, negative gingival bleeding, Neck:  Negative scars, masses, torticollis, lymphadenopathy, JVD Lungs: Clear to auscultation bilaterally without wheezes or crackles Cardiovascular: Regular rate and rhythm without murmur gallop or rub normal S1 and S2 Abdomen: negative abdominal pain, nondistended, positive soft, bowel sounds, no rebound, no ascites, no appreciable mass Extremities: No significant cyanosis, clubbing, or edema bilateral lower extremities Skin: Negative rashes, lesions, ulcers Psychiatric:  Negative depression, negative anxiety, negative fatigue, negative mania  Central nervous system:  Cranial nerves II through XII intact, tongue/uvula midline, all extremities muscle strength 5/5, sensation intact throughout, negative dysarthria, negative expressive aphasia, negative receptive aphasia.   .     Data Reviewed: Care during the described time interval was provided by me .  I have reviewed this patient's available data, including medical history, events of note, physical examination, and all test results as part of my evaluation.   CBC: Recent Labs  Lab 08/12/19 0055 08/13/19 0100  08/14/19 0330 08/15/19 0146  WBC 14.6* 16.6* 14.9* 15.3*  NEUTROABS 13.2* 15.4* 13.3* 13.6*  HGB 11.7* 11.5* 11.6* 12.5*  HCT 34.8* 33.5* 33.9* 35.8*  MCV 94.6 93.3 92.9 92.3  PLT 189 213 212 998   Basic Metabolic Panel: Recent Labs  Lab 08/14/19 0330 08/15/19 0146 08/16/19 0245 08/17/19 0205 08/18/19 0745  NA 135 140 138 135 151*  K 4.2 3.5 3.9 4.1 4.2  CL 103 102 98 97* 119*  CO2 _1 GLUCOSE 127* 60* 165* 96 114*  BUN 33* 29* 35* 32* 47*  CREATININE 0.81 0.79 0.94 0.80 1.22  CALCIUM 7.8* 8.5* 8.9 8.7* 9.6  MG 2.2 2.0 2.1 2.0 2.5*  PHOS 2.6 2.7 4.2 4.1 3.3   GFR: Estimated Creatinine Clearance: 36.9 mL/min (by C-G formula based on SCr of 1.22 mg/dL). Liver Function Tests: Recent Labs  Lab 08/12/19 0055 08/13/19 0100 08/14/19 0330 08/15/19 0146  AST 41 32 29 25  ALT _2 ALKPHOS 76 93 109 121  BILITOT 1.2 0.6 1.0 0.9  PROT 5.6* 5.4* 5.0* 5.2*  ALBUMIN 2.5* 2.3* 2.3* 2.5*   No results for input(s): LIPASE, AMYLASE in the last 168 hours. No results for input(s): AMMONIA in the last 168  hours. Coagulation Profile: No results for input(s): INR, PROTIME in the last 168 hours. Cardiac Enzymes: No results for input(s): CKTOTAL, CKMB, CKMBINDEX, TROPONINI in the last 168 hours. BNP (last 3 results) No results for input(s): PROBNP in the last 8760 hours. HbA1C: No results for input(s): HGBA1C in the last 72 hours. CBG: Recent Labs  Lab 08/17/19 1652 08/17/19 2138 08/18/19 0732 08/18/19 0742 08/18/19 1144  GLUCAP 371* 187* 154* 179* 230*   Lipid Profile: No results for input(s): CHOL, HDL, LDLCALC, TRIG, CHOLHDL, LDLDIRECT in the last 72 hours. Thyroid Function Tests: No results for input(s): TSH, T4TOTAL, FREET4, T3FREE, THYROIDAB in the last 72 hours. Anemia Panel: No results for input(s): VITAMINB12, FOLATE, FERRITIN, TIBC, IRON, RETICCTPCT in the last 72 hours. Urine analysis:    Component Value Date/Time   COLORURINE YELLOW  06/01/2019 [place] 06/01/2019 0853   LABSPEC 1.020 06/01/2019 0853   PHURINE 6.5 06/01/2019 0853   GLUCOSEU 250 (A) 06/01/2019 0853   HGBUR NEGATIVE 06/01/2019 [place] 06/01/2019 [place] 06/01/2019 0853   PROTEINUR 100 (A) 12/15/2016 1216   UROBILINOGEN 0.2 06/01/2019 0853   NITRITE NEGATIVE 06/01/2019 0853   LEUKOCYTESUR NEGATIVE 06/01/2019 0853   Sepsis Labs: _0 (procalcitonin:4,lacticidven:4)  ) Recent Results (from the past 240 hour(s))  Blood Culture (routine x 2)     Status: None   Collection Time: 07/25/2019  5:40 PM   Specimen: BLOOD  Result Value Ref Range Status   Specimen Description   Final    BLOOD BLOOD LEFT WRIST Performed at [facility], [address] [place], [place] [address]    Special Requests   Final    BOTTLES DRAWN AEROBIC AND ANAEROBIC Blood Culture adequate volume   Culture   Final    NO GROWTH 5 DAYS Performed at [facility], [place] [address], [place], [place] [address]    Report Status 08/15/2019 FINAL  Final  SARS Coronavirus 2 Ag (30 min TAT) - Nasal Swab (BD Veritor Kit)     Status: Abnormal   Collection Time: 08/02/2019  5:45 PM   Specimen: Nasal Swab (BD Veritor Kit)  Result Value Ref Range Status   SARS Coronavirus 2 Ag POSITIVE (A) NEGATIVE Final    Comment: RESULT CALLED TO, READ BACK BY AND VERIFIED WITH: RUTH MARCUM RN _1  07/22/2019 OLSONM (NOTE) SARS-CoV-2 antigen PRESENT. Positive results indicate the presence of viral antigens, but clinical correlation with patient history and other diagnostic information is necessary to determine patient infection status.  Positive results do not rule out bacterial infection or co-infection  with other viruses. False positive results are rare but can occur, and confirmatory RT-PCR testing may be appropriate in some circumstances. The expected result is Negative. Fact Sheet for Patients:  PodPark.tn Fact Sheet for Providers: GiftContent.is  This test is not yet approved or cleared by the [place] FDA and  has been authorized for detection and/or diagnosis of SARS-CoV-2 by FDA under an Emergency Use Authorization (EUA).  This EUA will remain in effect (meaning this test can be used) for the duration of  the COVID-19 decl aration under Section 564(b)(1) of the Act, 21 U.S.C. section 360bbb-3(b)(1), unless the authorization is terminated or revoked sooner. Performed at [facility], [address] [place], [place] [address]   MRSA PCR Screening     Status: Abnormal   Collection Time: 07/28/2019 10:20 PM   Specimen: Nasal Mucosa; Nasopharyngeal  Result Value Ref Range Status   MRSA by PCR POSITIVE (A) NEGATIVE Final    Comment:        The GeneXpert MRSA Assay (FDA approved for NASAL specimens only), is one component of a comprehensive MRSA colonization surveillance program. It is not intended to diagnose MRSA infection nor to guide or monitor treatment for MRSA infections. RESULT CALLED TO, READ BACK BY AND VERIFIED WITH: York Pellant @ [id number] ON 08/11/2019 C VARNER  Performed at [facility], [place] [address], [place], [place] [address]          Radiology Studies: No results found.      Scheduled Meds: . amLODipine  5 mg Oral Daily  . vitamin C  500 mg Oral Daily  . aspirin EC  81 mg Oral Daily  . Chlorhexidine Gluconate Cloth  6 each Topical Daily  . dexamethasone  6 mg Oral Q24H  . docusate sodium  100 mg Oral Daily  . enoxaparin (LOVENOX) injection  40 mg Subcutaneous Q24H  . insulin aspart  0-20 Units Subcutaneous TID WC  . insulin aspart  0-5 Units Subcutaneous QHS  . insulin aspart  10 Units Subcutaneous TID WC  . insulin detemir  8 Units Subcutaneous BID  . Ipratropium-Albuterol  1 puff Inhalation QID  . lisinopril  20 mg Oral Daily  . mupirocin  ointment   Nasal BID  . zinc sulfate  220 mg Oral Daily   Continuous Infusions: . sodium chloride Stopped (08/12/2019 2259)     LOS: 8 days   The patient is critically ill with multiple organ systems failure and requires high complexity decision making for assessment and support, frequent evaluation and titration of therapies, application of advanced monitoring technologies and extensive interpretation of multiple databases. Critical Care Time devoted to patient care services described in this note  Time spent: 40 minutes     Isaak Delmundo, Geraldo Docker, MD [facility] Pager [phone]  If 7PM-7AM, please contact night-coverage [email or web address] Password [email or web address] 08/18/2019, 2:25 PM

## 2019-08-18 NOTE — Progress Notes (Signed)
Updated son Emelda Fear) on current condition and treatment plans.

## 2019-08-18 NOTE — Plan of Care (Signed)

## 2019-08-19 ENCOUNTER — Inpatient Hospital Stay (HOSPITAL_COMMUNITY): Payer: Medicare Other

## 2019-08-19 LAB — COMPREHENSIVE METABOLIC PANEL
ALT: 27 U/L (ref 0–44)
AST: 21 U/L (ref 15–41)
Albumin: 2.2 g/dL — ABNORMAL LOW (ref 3.5–5.0)
Alkaline Phosphatase: 68 U/L (ref 38–126)
Anion gap: 8 (ref 5–15)
BUN: 33 mg/dL — ABNORMAL HIGH (ref 8–23)
CO2: 27 mmol/L (ref 22–32)
Calcium: 8.5 mg/dL — ABNORMAL LOW (ref 8.9–10.3)
Chloride: 100 mmol/L (ref 98–111)
Creatinine, Ser: 0.79 mg/dL (ref 0.61–1.24)
GFR calc Af Amer: >60 mL/min (ref >60)
GFR calc non Af Amer: >60 mL/min (ref >60)
Glucose, Bld: 82 mg/dL (ref 70–99)
Potassium: 4.4 mmol/L (ref 3.5–5.1)
Sodium: 135 mmol/L (ref 135–145)
Total Bilirubin: 0.7 mg/dL (ref 0.3–1.2)
Total Protein: 4.7 g/dL — ABNORMAL LOW (ref 6.5–8.1)

## 2019-08-19 LAB — GLUCOSE, CAPILLARY
Glucose-Capillary: 149 mg/dL — ABNORMAL HIGH (ref 70–99)
Glucose-Capillary: 187 mg/dL — ABNORMAL HIGH (ref 70–99)
Glucose-Capillary: 273 mg/dL — ABNORMAL HIGH (ref 70–99)

## 2019-08-19 LAB — CBC WITH DIFFERENTIAL/PLATELET
Abs Immature Granulocytes: 0.56 10*3/uL — ABNORMAL HIGH (ref 0.00–0.07)
Basophils Absolute: 0 10*3/uL (ref 0.0–0.1)
Basophils Relative: 0 %
Eosinophils Absolute: 0 10*3/uL (ref 0.0–0.5)
Eosinophils Relative: 0 %
HCT: 35.7 % — ABNORMAL LOW (ref 39.0–52.0)
Hemoglobin: 12 g/dL — ABNORMAL LOW (ref 13.0–17.0)
Immature Granulocytes: 4 %
Lymphocytes Relative: 3 %
Lymphs Abs: 0.4 10*3/uL — ABNORMAL LOW (ref 0.7–4.0)
MCH: 32.3 pg (ref 26.0–34.0)
MCHC: 33.6 g/dL (ref 30.0–36.0)
MCV: 96 fL (ref 80.0–100.0)
Monocytes Absolute: 0.4 10*3/uL (ref 0.1–1.0)
Monocytes Relative: 3 %
Neutro Abs: 12.9 10*3/uL — ABNORMAL HIGH (ref 1.7–7.7)
Neutrophils Relative %: 90 %
Platelets: 192 10*3/uL (ref 150–400)
RBC: 3.72 MIL/uL — ABNORMAL LOW (ref 4.22–5.81)
RDW: 13 % (ref 11.5–15.5)
WBC: 14.3 10*3/uL — ABNORMAL HIGH (ref 4.0–10.5)
nRBC: 0.1 % (ref 0.0–0.2)

## 2019-08-19 LAB — FERRITIN: Ferritin: 353 ng/mL — ABNORMAL HIGH (ref 24–336)

## 2019-08-19 LAB — D-DIMER, QUANTITATIVE: D-Dimer, Quant: 10.67 ug/mL-FEU — ABNORMAL HIGH (ref 0.00–0.50)

## 2019-08-19 LAB — C-REACTIVE PROTEIN: CRP: <0.5 mg/dL (ref <1.0)

## 2019-08-19 LAB — PHOSPHORUS: Phosphorus: 3.8 mg/dL (ref 2.5–4.6)

## 2019-08-19 LAB — MAGNESIUM: Magnesium: 2.2 mg/dL (ref 1.7–2.4)

## 2019-08-19 MED ORDER — IOHEXOL 350 MG/ML SOLN
75.0000 mL | Freq: Once | INTRAVENOUS | Status: AC | PRN
  Administered 2019-08-19: 75 mL via INTRAVENOUS

## 2019-08-19 MED ORDER — INSULIN DETEMIR 100 UNIT/ML ~~LOC~~ SOLN
10.0000 [IU] | Freq: Two times a day (BID) | SUBCUTANEOUS | Status: DC
  Administered 2019-08-19 – 2019-08-28 (×17): 10 [IU] via SUBCUTANEOUS
  Filled 2019-08-19 (×19): qty 0.1

## 2019-08-19 NOTE — Progress Notes (Addendum)
PROGRESS NOTE    Aaron Blair  UXN:[id number] DOB: 1934-08-03 DOA: 07/14/2019 PCP: No primary care provider on file.   Brief Narrative:  ([place] speaker) 86 y.o.  Asian male, PMHx  diabetes type II uncontrolled with complication,  HTN, HLD   Presents as a transfer from [facility] for management of respiratory failure with hypoxia.  Patient does not speak [place], history obtained from family over the phone.  Over the past week patient has had subjective fevers with chills, poor appetite and started having shortness of breath with lethargy today prompting the ER presentation.  In the ER he was found to have oxygen saturation in the 50s on room air, CXR consistent with viral pneumonia, SARS-CoV-2 positive.  Patient transferred for further management.  No reported nausea, vomiting, diarrhea.  He has intermittent cough which is nonproductive.  No reported chest pain, palpitations, orthopnea, PND, lower extremity edema.    Subjective: 1/7 A/O x4, negative CP.  States hungry but having difficulty eating secondary to positive S OB     Assessment & Plan:   Principal Problem:   Acute hypoxemic respiratory failure due to severe acute respiratory syndrome coronavirus 2 (SARS-CoV-2) disease (HCC) Active Problems:   Chronic diastolic CHF (congestive heart failure) (HCC)   Hypertension   Hyperlipidemia   Demand ischemia ([place])   Pneumonia due to COVID-19 virus   Uncontrolled type 2 diabetes mellitus with complication (HCC)   Elevated troponin   Essential hypertension  Covid pneumonia/acute respiratory failure with hypoxia COVID-19 Labs Recent Labs    08/19/19 0040  DDIMER 10.67*  FERRITIN 353*  CRP <0.5    12/29 SARS coronavirus positive  -Decadron 6 mg Daily -Remdesivir per pharmacy protocol -Combivent QID  -vitamins per Covid protocol -Flutter valve -Incentive spirometry -12/30 Actemra x1 dose -1/2 Lasix IV 60 mg x 2 dose -1/7 Actemra times second dose  Diabetes type 2  uncontrolled with complication -25/42 hemoglobin A1c= 9.8 -1/1 Endo tool until patient's CBG back under control -1/2 NovoLog 8 units qac -1/7 increase Levemir 10 units BID -resistant SSI  Elevated D-dimer -CTA PE protocol negative see results below -Bilateral lower extremity Doppler negative DVT -1/7 D-dimer increasing with increasing S OB repeat CTA PE protocol pending  Chest pain/elevated troponin/demand ischemia -EKG pending -Eechocardiogram; not consistent with ACS/MI see results below  Essential HTN -Amlodipine 5 mg daily -Lisinopril lisinopril 20 mg daily  Hypokalemia -Potassium goal> 4    DVT prophylaxis: Lovenox Code Status: DNR Family Communication: 08/14/2019 spoke with Leigh Aurora (son) explained plan of care answered all questions Disposition Plan: TBD   Consultants:    Procedures/Significant Events:  12/30 Echocardiogram Left Ventricle: EV= 65 to 70%. - moderately increased LVH Aortic Valve: Severely calcified AoV with focal calcification of the NCC. Restricted movement of the [facility] 12/30 CTA chest PE protocol;.-Extensive ground-glass opacities in keeping with history of COVID-19 positivity. 12/30 Actemra x1 dose  Motion degraded study with no evidence of pulmonary embolism. 1/2 bilateral lower extremity Doppler; negative DVT 1/3 PCXR; 1/7 Actemra times second dose   I have personally reviewed and interpreted all radiology studies and my findings are as above.  VENTILATOR SETTINGS: HHFNC 1/7 Flow; 15 L/min FiO2; 100% SPO2 100%   Cultures   Antimicrobials: Anti-infectives (From admission, onward)   Start     Dose/Rate Stop   08/11/19 1000  remdesivir 100 mg in sodium chloride 0.9 % 100 mL IVPB     100 mg 200 mL/hr over 30 Minutes 08/15/19 0959   08/02/2019 2230  remdesivir 200 mg in sodium chloride 0.9% 250 mL IVPB     200 mg 580 mL/hr over 30 Minutes 07/21/2019 2251   07/14/2019 1745  cefTRIAXone (ROCEPHIN) 1 g in sodium chloride 0.9 % 100 mL IVPB     1  g 200 mL/hr over 30 Minutes 07/28/2019 1945   08/09/2019 1745  azithromycin (ZITHROMAX) 500 mg in sodium chloride 0.9 % 250 mL IVPB     500 mg 250 mL/hr over 60 Minutes 08/09/2019 2057       Devices    LINES / TUBES:      Continuous Infusions: . sodium chloride Stopped (07/24/2019 2259)     Objective: Vitals:   08/19/19 0403 08/19/19 0744 08/19/19 1233 08/19/19 1236  BP: 140/72 137/78 126/84   Pulse: 77 89 (!) 107 99  Resp: 16 18 (!) 22 (!) 22  Temp: (!) 97.2 F (36.2 C) (!) 97.3 F (36.3 C) 97.9 F (36.6 C)   TempSrc: Oral Oral Axillary   SpO2: 93% (!) 89% 95% 94%  Weight:      Height:        Intake/Output Summary (Last 24 hours) at 08/19/2019 1656 Last data filed at 08/19/2019 1558 Gross per 24 hour  Intake 420 ml  Output 600 ml  Net -180 ml   Filed Weights   08/03/2019 1711  Weight: 63.5 kg   Physical Exam:  General: A/O x4, positive increasing acute respiratory distress Eyes: negative scleral hemorrhage, negative anisocoria, negative icterus ENT: Negative Runny nose, negative gingival bleeding, Neck:  Negative scars, masses, torticollis, lymphadenopathy, JVD Lungs: Tachypneic decreased breath sounds bilaterally without wheezes or crackles Cardiovascular: Regular rate and rhythm without murmur gallop or rub normal S1 and S2 Abdomen: negative abdominal pain, nondistended, positive soft, bowel sounds, no rebound, no ascites, no appreciable mass Extremities: No significant cyanosis, clubbing, or edema bilateral lower extremities Skin: Negative rashes, lesions, ulcers Psychiatric:  Negative depression, negative anxiety, negative fatigue, negative mania  Central nervous system:  Cranial nerves II through XII intact, tongue/uvula midline, all extremities muscle strength 5/5, sensation intact throughout, negative dysarthria, negative expressive aphasia, negative receptive aphasia.   .     Data Reviewed: Care during the described time interval was provided by me .  I  have reviewed this patient's available data, including medical history, events of note, physical examination, and all test results as part of my evaluation.   CBC: Recent Labs  Lab 08/13/19 0100 08/14/19 0330 08/15/19 0146 08/19/19 0040  WBC 16.6* 14.9* 15.3* 14.3*  NEUTROABS 15.4* 13.3* 13.6* 12.9*  HGB 11.5* 11.6* 12.5* 12.0*  HCT 33.5* 33.9* 35.8* 35.7*  MCV 93.3 92.9 92.3 96.0  PLT 213 212 234 370   Basic Metabolic Panel: Recent Labs  Lab 08/15/19 0146 08/16/19 0245 08/17/19 0205 08/18/19 0745 08/19/19 0040  NA 140 138 135 151* 135  K 3.5 3.9 4.1 4.2 4.4  CL 102 98 97* 119* 100  CO2 27 29 30 23 27   GLUCOSE 60* 165* 96 114* 82  BUN 29* 35* 32* 47* 33*  CREATININE 0.79 0.94 0.80 1.22 0.79  CALCIUM 8.5* 8.9 8.7* 9.6 8.5*  MG 2.0 2.1 2.0 2.5* 2.2  PHOS 2.7 4.2 4.1 3.3 3.8   GFR: Estimated Creatinine Clearance: 56.3 mL/min (by C-G formula based on SCr of 0.79 mg/dL). Liver Function Tests: Recent Labs  Lab 08/13/19 0100 08/14/19 0330 08/15/19 0146 08/19/19 0040  AST 32 29 25 21   ALT 18 24 28 27   ALKPHOS 93 109 121 68  BILITOT 0.6 1.0 0.9 0.7  PROT 5.4* 5.0* 5.2* 4.7*  ALBUMIN 2.3* 2.3* 2.5* 2.2*   No results for input(s): LIPASE, AMYLASE in the last 168 hours. No results for input(s): AMMONIA in the last 168 hours. Coagulation Profile: No results for input(s): INR, PROTIME in the last 168 hours. Cardiac Enzymes: No results for input(s): CKTOTAL, CKMB, CKMBINDEX, TROPONINI in the last 168 hours. BNP (last 3 results) No results for input(s): PROBNP in the last 8760 hours. HbA1C: No results for input(s): HGBA1C in the last 72 hours. CBG: Recent Labs  Lab 08/18/19 1144 08/18/19 1623 08/18/19 2015 08/19/19 0717 08/19/19 1133  GLUCAP 230* 247* 241* 149* 187*   Lipid Profile: No results for input(s): CHOL, HDL, LDLCALC, TRIG, CHOLHDL, LDLDIRECT in the last 72 hours. Thyroid Function Tests: No results for input(s): TSH, T4TOTAL, FREET4, T3FREE,  THYROIDAB in the last 72 hours. Anemia Panel: Recent Labs    08/19/19 0040  FERRITIN 353*   Urine analysis:    Component Value Date/Time   COLORURINE YELLOW 06/01/2019 [place] 06/01/2019 0853   LABSPEC 1.020 06/01/2019 0853   PHURINE 6.5 06/01/2019 0853   GLUCOSEU 250 (A) 06/01/2019 0853   HGBUR NEGATIVE 06/01/2019 [place] 06/01/2019 [place] 06/01/2019 0853   PROTEINUR 100 (A) 12/15/2016 1216   UROBILINOGEN 0.2 06/01/2019 0853   NITRITE NEGATIVE 06/01/2019 0853   LEUKOCYTESUR NEGATIVE 06/01/2019 0853   Sepsis Labs: @LABRCNTIP (procalcitonin:4,lacticidven:4)  ) Recent Results (from the past 240 hour(s))  Blood Culture (routine x 2)     Status: None   Collection Time: 07/25/2019  5:40 PM   Specimen: BLOOD  Result Value Ref Range Status   Specimen Description   Final    BLOOD BLOOD LEFT WRIST Performed at [facility], [address] [place], [place] [address]    Special Requests   Final    BOTTLES DRAWN AEROBIC AND ANAEROBIC Blood Culture adequate volume   Culture   Final    NO GROWTH 5 DAYS Performed at [facility], [place] [address], [place], [place] [address]    Report Status 08/15/2019 FINAL  Final  SARS Coronavirus 2 Ag (30 min TAT) - Nasal Swab (BD Veritor Kit)     Status: Abnormal   Collection Time: 08/11/2019  5:45 PM   Specimen: Nasal Swab (BD Veritor Kit)  Result Value Ref Range Status   SARS Coronavirus 2 Ag POSITIVE (A) NEGATIVE Final    Comment: RESULT CALLED TO, READ BACK BY AND VERIFIED WITH: Ilda Basset RN @2036  08/11/2019 OLSONM (NOTE) SARS-CoV-2 antigen PRESENT. Positive results indicate the presence of viral antigens, but clinical correlation with patient history and other diagnostic information is necessary to determine patient infection status.  Positive results do not rule out bacterial infection or co-infection  with other viruses. False positive results are rare but can  occur, and confirmatory RT-PCR testing may be appropriate in some circumstances. The expected result is Negative. Fact Sheet for Patients: PodPark.tn Fact Sheet for Providers: GiftContent.is  This test is not yet approved or cleared by the [place] FDA and  has been authorized for detection and/or diagnosis of SARS-CoV-2 by FDA under an Emergency Use Authorization (EUA).  This EUA will remain in effect (meaning this test can be used) for the duration of  the COVID-19 decl aration under Section 564(b)(1) of the Act, 21 U.S.C. section 360bbb-3(b)(1), unless the authorization is terminated or revoked sooner. Performed at [facility], [address] [place], [place] [address]   MRSA PCR Screening     Status: Abnormal   Collection Time: 08/06/2019 10:20 PM   Specimen: Nasal Mucosa; Nasopharyngeal  Result Value Ref Range Status   MRSA by PCR POSITIVE (A) NEGATIVE Final    Comment:        The GeneXpert MRSA Assay (FDA approved for NASAL specimens only), is one component of a comprehensive MRSA colonization surveillance program. It is not intended to diagnose MRSA infection nor to guide or monitor treatment for MRSA infections. RESULT CALLED TO, READ BACK BY AND VERIFIED WITH: York Pellant @ 8850 ON 08/11/2019 C VARNER  Performed at [facility], [place] [address] [place], [place] [address]          Radiology Studies: No results found.      Scheduled Meds: . amLODipine  5 mg Oral Daily  . vitamin C  500 mg Oral Daily  . aspirin EC  81 mg Oral Daily  . Chlorhexidine Gluconate Cloth  6 each Topical Daily  . dexamethasone  6 mg Oral Q24H  . docusate sodium  100 mg Oral Daily  . enoxaparin (LOVENOX) injection  40 mg Subcutaneous Q24H  . insulin aspart  0-20 Units Subcutaneous TID WC  . insulin aspart  0-5 Units Subcutaneous QHS  . insulin aspart  10 Units Subcutaneous TID WC  .  insulin detemir  8 Units Subcutaneous BID  . Ipratropium-Albuterol  1 puff Inhalation QID  . lisinopril  20 mg Oral Daily  . mupirocin ointment   Nasal BID  . zinc sulfate  220 mg Oral Daily   Continuous Infusions: . sodium chloride Stopped (07/20/2019 2259)     LOS: 9 days   The patient is critically ill with multiple organ systems failure and requires high complexity decision making for assessment and support, frequent evaluation and titration of therapies, application of advanced monitoring technologies and extensive interpretation of multiple databases. Critical Care Time devoted to patient care services described in this note  Time spent: 40 minutes     Jakaiden Fill, Geraldo Docker, MD [facility] Pager [phone]  If 7PM-7AM, please contact night-coverage [email or web address] Password [facility] 08/19/2019, 4:56 PM

## 2019-08-19 NOTE — Plan of Care (Signed)

## 2019-08-19 NOTE — Progress Notes (Signed)
Patient has pulled O2 off multiple times. Sats down to high 70s. Patient encouraged to wear NRB and HFNC.

## 2019-08-20 LAB — CBC WITH DIFFERENTIAL/PLATELET
Abs Immature Granulocytes: 0.33 10*3/uL — ABNORMAL HIGH (ref 0.00–0.07)
Basophils Absolute: 0 10*3/uL (ref 0.0–0.1)
Basophils Relative: 0 %
Eosinophils Absolute: 0 10*3/uL (ref 0.0–0.5)
Eosinophils Relative: 0 %
HCT: 36.5 % — ABNORMAL LOW (ref 39.0–52.0)
Hemoglobin: 12.2 g/dL — ABNORMAL LOW (ref 13.0–17.0)
Immature Granulocytes: 3 %
Lymphocytes Relative: 2 %
Lymphs Abs: 0.3 10*3/uL — ABNORMAL LOW (ref 0.7–4.0)
MCH: 32.4 pg (ref 26.0–34.0)
MCHC: 33.4 g/dL (ref 30.0–36.0)
MCV: 96.8 fL (ref 80.0–100.0)
Monocytes Absolute: 0.3 10*3/uL (ref 0.1–1.0)
Monocytes Relative: 3 %
Neutro Abs: 11.4 10*3/uL — ABNORMAL HIGH (ref 1.7–7.7)
Neutrophils Relative %: 92 %
Platelets: 170 10*3/uL (ref 150–400)
RBC: 3.77 MIL/uL — ABNORMAL LOW (ref 4.22–5.81)
RDW: 13.1 % (ref 11.5–15.5)
WBC: 12.3 10*3/uL — ABNORMAL HIGH (ref 4.0–10.5)
nRBC: 0 % (ref 0.0–0.2)

## 2019-08-20 LAB — MAGNESIUM: Magnesium: 2.2 mg/dL (ref 1.7–2.4)

## 2019-08-20 LAB — COMPREHENSIVE METABOLIC PANEL
ALT: 25 U/L (ref 0–44)
AST: 22 U/L (ref 15–41)
Albumin: 2.4 g/dL — ABNORMAL LOW (ref 3.5–5.0)
Alkaline Phosphatase: 76 U/L (ref 38–126)
Anion gap: 11 (ref 5–15)
BUN: 26 mg/dL — ABNORMAL HIGH (ref 8–23)
CO2: 26 mmol/L (ref 22–32)
Calcium: 8.7 mg/dL — ABNORMAL LOW (ref 8.9–10.3)
Chloride: 98 mmol/L (ref 98–111)
Creatinine, Ser: 0.77 mg/dL (ref 0.61–1.24)
GFR calc Af Amer: >60 mL/min (ref >60)
GFR calc non Af Amer: >60 mL/min (ref >60)
Glucose, Bld: 149 mg/dL — ABNORMAL HIGH (ref 70–99)
Potassium: 4.5 mmol/L (ref 3.5–5.1)
Sodium: 135 mmol/L (ref 135–145)
Total Bilirubin: 0.8 mg/dL (ref 0.3–1.2)
Total Protein: 4.8 g/dL — ABNORMAL LOW (ref 6.5–8.1)

## 2019-08-20 LAB — GLUCOSE, CAPILLARY
Glucose-Capillary: 93 mg/dL (ref 70–99)
Glucose-Capillary: 193 mg/dL — ABNORMAL HIGH (ref 70–99)
Glucose-Capillary: 305 mg/dL — ABNORMAL HIGH (ref 70–99)
Glucose-Capillary: 179 mg/dL — ABNORMAL HIGH (ref 70–99)

## 2019-08-20 LAB — FERRITIN: Ferritin: 327 ng/mL (ref 24–336)

## 2019-08-20 LAB — C-REACTIVE PROTEIN: CRP: 0.7 mg/dL (ref <1.0)

## 2019-08-20 LAB — PHOSPHORUS: Phosphorus: 3.7 mg/dL (ref 2.5–4.6)

## 2019-08-20 LAB — D-DIMER, QUANTITATIVE: D-Dimer, Quant: 10.51 ug/mL-FEU — ABNORMAL HIGH (ref 0.00–0.50)

## 2019-08-20 MED ORDER — INSULIN ASPART 100 UNIT/ML ~~LOC~~ SOLN
12.0000 [IU] | Freq: Three times a day (TID) | SUBCUTANEOUS | Status: DC
  Administered 2019-08-21 – 2019-08-28 (×17): 12 [IU] via SUBCUTANEOUS

## 2019-08-20 NOTE — Progress Notes (Signed)
PROGRESS NOTE    Aaron Blair  LGX:[id number] DOB: [birth year] DOA: 08/07/2019 PCP: No primary care provider on file.   Brief Narrative:  ([place] speaker) 86 y.o.  Asian male, PMHx  diabetes type II uncontrolled with complication,  HTN, HLD   Presents as a transfer from [facility] for management of respiratory failure with hypoxia.  Patient does not speak [place], history obtained from family over the phone.  Over the past week patient has had subjective fevers with chills, poor appetite and started having shortness of breath with lethargy today prompting the ER presentation.  In the ER he was found to have oxygen saturation in the 50s on room air, CXR consistent with viral pneumonia, SARS-CoV-2 positive.  Patient transferred for further management.  No reported nausea, vomiting, diarrhea.  He has intermittent cough which is nonproductive.  No reported chest pain, palpitations, orthopnea, PND, lower extremity edema.    Subjective: 1/8/O x4, negative CP, positive S OB.  Family has prepared meals for patient     Assessment & Plan:   Principal Problem:   Acute hypoxemic respiratory failure due to severe acute respiratory syndrome coronavirus 2 (SARS-CoV-2) disease (HCC) Active Problems:   Chronic diastolic CHF (congestive heart failure) ([place])   Hypertension   Hyperlipidemia   Demand ischemia ([place])   Pneumonia due to COVID-19 virus   Uncontrolled type 2 diabetes mellitus with complication (HCC)   Elevated troponin   Essential hypertension  Covid pneumonia/acute respiratory failure with hypoxia COVID-19 Labs Recent Labs    08/19/19 0040 08/20/19 0100  DDIMER 10.67* 10.51*  FERRITIN 353* 327  CRP <0.5 0.7    12/29 SARS coronavirus positive  -Decadron 6 mg Daily -Remdesivir per pharmacy protocol -Combivent QID  -vitamins per Covid protocol -Flutter valve -Incentive spirometry -12/30 Actemra x1 dose -1/2 Lasix IV 60 mg x 2 dose -1/7 Actemra times second dose  Diabetes type 2  uncontrolled with complication -81/44 hemoglobin A1c= 9.8 -1/1 Endo tool until patient's CBG back under control -1/7 increase Levemir 10 units BID -1/8 increase NovoLog 12 units qac  -resistant SSI  Elevated D-dimer -CTA PE protocol negative see results below -Bilateral lower extremity Doppler negative DVT -1/7 D-dimer increasing with increasing S OB repeat CTA PE protocol pending  Chest pain/elevated troponin/demand ischemia -EKG pending -Eechocardiogram; not consistent with ACS/MI see results below  Essential HTN -Amlodipine 5 mg daily -Lisinopril lisinopril 20 mg daily  Hypokalemia -Potassium goal> 4    DVT prophylaxis: Lovenox Code Status: DNR Family Communication: 08/19/2019 spoke with Leigh Aurora (son) explained plan of care answered all questions Disposition Plan: TBD   Consultants:    Procedures/Significant Events:  12/30 Echocardiogram Left Ventricle: EV= 65 to 70%. - moderately increased LVH Aortic Valve: Severely calcified AoV with focal calcification of the NCC. Restricted movement of the [facility] 12/30 CTA chest PE protocol;.-Extensive ground-glass opacities in keeping with history of COVID-19 positivity. 12/30 Actemra x1 dose  Motion degraded study with no evidence of pulmonary embolism. 1/2 bilateral lower extremity Doppler; negative DVT 1/3 PCXR; 1/7 Actemra times second dose 1/7 CTA PE protocol;-negative PE  -Mildly improved multifocal pneumonia related to COVID-19 infection. -Emphysema     I have personally reviewed and interpreted all radiology studies and my findings are as above.  VENTILATOR SETTINGS: HFNC 1/8 Flow; 15 L/min SPO2 99%   Cultures   Antimicrobials: Anti-infectives (From admission, onward)   Start     Dose/Rate Stop   08/11/19 1000  remdesivir 100 mg in sodium chloride 0.9 % 100 mL  IVPB     100 mg 200 mL/hr over 30 Minutes 08/15/19 0959   07/31/2019 2230  remdesivir 200 mg in sodium chloride 0.9% 250 mL IVPB     200 mg 580 mL/hr  over 30 Minutes 07/26/2019 2251   08/09/2019 1745  cefTRIAXone (ROCEPHIN) 1 g in sodium chloride 0.9 % 100 mL IVPB     1 g 200 mL/hr over 30 Minutes 07/19/2019 1945   07/28/2019 1745  azithromycin (ZITHROMAX) 500 mg in sodium chloride 0.9 % 250 mL IVPB     500 mg 250 mL/hr over 60 Minutes 08/08/2019 2057       Devices    LINES / TUBES:      Continuous Infusions: . sodium chloride Stopped (07/25/2019 2259)     Objective: Vitals:   08/20/19 0002 08/20/19 0341 08/20/19 0729 08/20/19 1200  BP: (!) 153/78 (!) 144/82 (!) 150/78 110/75  Pulse:   98   Resp:      Temp: 97.7 F (36.5 C) 97.9 F (36.6 C) (!) 97.4 F (36.3 C) 97.6 F (36.4 C)  TempSrc: Oral Oral Oral Oral  SpO2:   90%   Weight:      Height:        Intake/Output Summary (Last 24 hours) at 08/20/2019 1436 Last data filed at 08/20/2019 0900 Gross per 24 hour  Intake 1280 ml  Output 1400 ml  Net -120 ml   Filed Weights   07/14/2019 1711  Weight: 63.5 kg   Physical Exam:  General: A/O x4, positive acute respiratory distress Eyes: negative scleral hemorrhage, negative anisocoria, negative icterus ENT: Negative Runny nose, negative gingival bleeding, Neck:  Negative scars, masses, torticollis, lymphadenopathy, JVD Lungs: Clear to auscultation bilaterally without wheezes or crackles Cardiovascular: Regular rate and rhythm without murmur gallop or rub normal S1 and S2 Abdomen: negative abdominal pain, nondistended, positive soft, bowel sounds, no rebound, no ascites, no appreciable mass Extremities: No significant cyanosis, clubbing, or edema bilateral lower extremities Skin: Negative rashes, lesions, ulcers Psychiatric:  Negative depression, negative anxiety, negative fatigue, negative mania  Central nervous system:  Cranial nerves II through XII intact, tongue/uvula midline, all extremities muscle strength 5/5, sensation intact throughout, negative dysarthria, negative expressive aphasia, negative receptive  aphasia.    .     Data Reviewed: Care during the described time interval was provided by me .  I have reviewed this patient's available data, including medical history, events of note, physical examination, and all test results as part of my evaluation.   CBC: Recent Labs  Lab 08/14/19 0330 08/15/19 0146 08/19/19 0040 08/20/19 0100  WBC 14.9* 15.3* 14.3* 12.3*  NEUTROABS 13.3* 13.6* 12.9* 11.4*  HGB 11.6* 12.5* 12.0* 12.2*  HCT 33.9* 35.8* 35.7* 36.5*  MCV 92.9 92.3 96.0 96.8  PLT 212 234 192 244   Basic Metabolic Panel: Recent Labs  Lab 08/16/19 0245 08/17/19 0205 08/18/19 0745 08/19/19 0040 08/20/19 0100  NA 138 135 151* 135 135  K 3.9 4.1 4.2 4.4 4.5  CL 98 97* 119* 100 98  CO2 _0 GLUCOSE 165* 96 114* 82 149*  BUN 35* 32* 47* 33* 26*  CREATININE 0.94 0.80 1.22 0.79 0.77  CALCIUM 8.9 8.7* 9.6 8.5* 8.7*  MG 2.1 2.0 2.5* 2.2 2.2  PHOS 4.2 4.1 3.3 3.8 3.7   GFR: Estimated Creatinine Clearance: 56.3 mL/min (by C-G formula based on SCr of 0.77 mg/dL). Liver Function Tests: Recent Labs  Lab 08/14/19 0330 08/15/19 0146 08/19/19 0040 08/20/19  0100  AST _0 ALT _1 ALKPHOS 109 121 68 76  BILITOT 1.0 0.9 0.7 0.8  PROT 5.0* 5.2* 4.7* 4.8*  ALBUMIN 2.3* 2.5* 2.2* 2.4*   No results for input(s): LIPASE, AMYLASE in the last 168 hours. No results for input(s): AMMONIA in the last 168 hours. Coagulation Profile: No results for input(s): INR, PROTIME in the last 168 hours. Cardiac Enzymes: No results for input(s): CKTOTAL, CKMB, CKMBINDEX, TROPONINI in the last 168 hours. BNP (last 3 results) No results for input(s): PROBNP in the last 8760 hours. HbA1C: No results for input(s): HGBA1C in the last 72 hours. CBG: Recent Labs  Lab 08/19/19 0717 08/19/19 1133 08/19/19 1935 08/20/19 0746 08/20/19 1126  GLUCAP 149* 187* 273* 93 193*   Lipid Profile: No results for input(s): CHOL, HDL, LDLCALC, TRIG, CHOLHDL, LDLDIRECT in the  last 72 hours. Thyroid Function Tests: No results for input(s): TSH, T4TOTAL, FREET4, T3FREE, THYROIDAB in the last 72 hours. Anemia Panel: Recent Labs    08/19/19 0040 08/20/19 0100  FERRITIN 353* 327   Urine analysis:    Component Value Date/Time   COLORURINE YELLOW 06/01/2019 [place] 06/01/2019 0853   LABSPEC 1.020 06/01/2019 0853   PHURINE 6.5 06/01/2019 0853   GLUCOSEU 250 (A) 06/01/2019 0853   HGBUR NEGATIVE 06/01/2019 [facility] 06/01/2019 [place] 06/01/2019 0853   PROTEINUR 100 (A) 12/15/2016 1216   UROBILINOGEN 0.2 06/01/2019 0853   NITRITE NEGATIVE 06/01/2019 0853   LEUKOCYTESUR NEGATIVE 06/01/2019 0853   Sepsis Labs: _2 (procalcitonin:4,lacticidven:4)  ) Recent Results (from the past 240 hour(s))  Blood Culture (routine x 2)     Status: None   Collection Time: 08/07/2019  5:40 PM   Specimen: BLOOD  Result Value Ref Range Status   Specimen Description   Final    BLOOD BLOOD LEFT WRIST Performed at [facility], [address] [place], [place] [address]    Special Requests   Final    BOTTLES DRAWN AEROBIC AND ANAEROBIC Blood Culture adequate volume   Culture   Final    NO GROWTH 5 DAYS Performed at [facility], [place] [address] [place], [place] [address]    Report Status 08/15/2019 FINAL  Final  SARS Coronavirus 2 Ag (30 min TAT) - Nasal Swab (BD Veritor Kit)     Status: Abnormal   Collection Time: 08/02/2019  5:45 PM   Specimen: Nasal Swab (BD Veritor Kit)  Result Value Ref Range Status   SARS Coronavirus 2 Ag POSITIVE (A) NEGATIVE Final    Comment: RESULT CALLED TO, READ BACK BY AND VERIFIED WITH: RUTH MARCUM RN _3  08/12/2019 OLSONM (NOTE) SARS-CoV-2 antigen PRESENT. Positive results indicate the presence of viral antigens, but clinical correlation with patient history and other diagnostic information is necessary to determine patient infection status.  Positive results do  not rule out bacterial infection or co-infection  with other viruses. False positive results are rare but can occur, and confirmatory RT-PCR testing may be appropriate in some circumstances. The expected result is Negative. Fact Sheet for Patients: PodPark.tn Fact Sheet for Providers: GiftContent.is  This test is not yet approved or cleared by the [place] FDA and  has been authorized for detection and/or diagnosis of SARS-CoV-2 by FDA under an Emergency Use Authorization (EUA).  This EUA will remain in effect (meaning this test can be used) for the duration of  the COVID-19  decl aration under Section 564(b)(1) of the Act, 21 U.S.C. section 360bbb-3(b)(1), unless the authorization is terminated or revoked sooner. Performed at [facility], [address] [place], [place] [address]   MRSA PCR Screening     Status: Abnormal   Collection Time: 07/26/2019 10:20 PM   Specimen: Nasal Mucosa; Nasopharyngeal  Result Value Ref Range Status   MRSA by PCR POSITIVE (A) NEGATIVE Final    Comment:        The GeneXpert MRSA Assay (FDA approved for NASAL specimens only), is one component of a comprehensive MRSA colonization surveillance program. It is not intended to diagnose MRSA infection nor to guide or monitor treatment for MRSA infections. RESULT CALLED TO, READ BACK BY AND VERIFIED WITH: York Pellant @ [id number] ON 08/11/2019 C VARNER  Performed at [facility], [place] [address] [place], [place] [address]          Radiology Studies: CT ANGIO CHEST PE W OR WO CONTRAST  Result Date: 08/19/2019 CLINICAL DATA:  Worsening shortness of breath. Increased D-dimer. Evaluate for pulmonary embolism. COVID-19 pneumonia. EXAM: CT ANGIOGRAPHY CHEST WITH CONTRAST TECHNIQUE: Multidetector CT imaging of the chest was performed using the standard protocol during bolus administration of intravenous contrast.  Multiplanar CT image reconstructions and MIPs were obtained to evaluate the vascular anatomy. CONTRAST:  58m OMNIPAQUE IOHEXOL 350 MG/ML SOLN COMPARISON:  CTA chest dated August 11, 2019. FINDINGS: Cardiovascular: Limited examination due to respiratory motion artifact. No definite pulmonary embolism. Normal heart size. No pericardial effusion. No thoracic aortic aneurysm or dissection. Coronary, aortic arch, and branch vessel atherosclerotic vascular disease. Unchanged noncalcified mural plaque in the descending thoracic aorta. Mediastinum/Nodes: No enlarged mediastinal, hilar, or axillary lymph nodes. Thyroid gland, trachea, and esophagus demonstrate no significant findings. Lungs/Pleura: Diffuse ground-glass densities throughout both lungs has mildly improved. No pleural effusion or pneumothorax. Advanced emphysema again noted. Upper Abdomen: No acute abnormality. Musculoskeletal: No chest wall abnormality. No acute or significant osseous findings. Review of the MIP images confirms the above findings. IMPRESSION: 1. Limited examination due to respiratory motion artifact. No definite pulmonary embolism. 2. Mildly improved multifocal pneumonia related to COVID-19 infection. 3.  Emphysema (ICD10-J43.9). 4.  Aortic atherosclerosis (ICD10-I70.0). Electronically Signed   By: WTitus DubinM.D.   On: 08/19/2019 18:58        Scheduled Meds: . amLODipine  5 mg Oral Daily  . vitamin C  500 mg Oral Daily  . aspirin EC  81 mg Oral Daily  . Chlorhexidine Gluconate Cloth  6 each Topical Daily  . docusate sodium  100 mg Oral Daily  . enoxaparin (LOVENOX) injection  40 mg Subcutaneous Q24H  . insulin aspart  0-20 Units Subcutaneous TID WC  . insulin aspart  0-5 Units Subcutaneous QHS  . insulin aspart  10 Units Subcutaneous TID WC  . insulin detemir  10 Units Subcutaneous BID  . Ipratropium-Albuterol  1 puff Inhalation QID  . lisinopril  20 mg Oral Daily  . mupirocin ointment   Nasal BID  . zinc sulfate   220 mg Oral Daily   Continuous Infusions: . sodium chloride Stopped (07/29/2019 2259)     LOS: 10 days   The patient is critically ill with multiple organ systems failure and requires high complexity decision making for assessment and support, frequent evaluation and titration of therapies, application of advanced monitoring technologies and extensive interpretation of multiple databases. Critical Care Time devoted to patient care services described in this note  Time  spent: 40 minutes     Lanya Bucks, Geraldo Docker, MD [facility] Pager [phone]  If 7PM-7AM, please contact night-coverage [email or web address] Password [facility] 08/20/2019, 2:36 PM

## 2019-08-20 NOTE — Plan of Care (Signed)

## 2019-08-21 LAB — GLUCOSE, CAPILLARY
Glucose-Capillary: 101 mg/dL — ABNORMAL HIGH (ref 70–99)
Glucose-Capillary: 294 mg/dL — ABNORMAL HIGH (ref 70–99)
Glucose-Capillary: 74 mg/dL (ref 70–99)
Glucose-Capillary: 196 mg/dL — ABNORMAL HIGH (ref 70–99)

## 2019-08-21 LAB — COMPREHENSIVE METABOLIC PANEL
ALT: 22 U/L (ref 0–44)
AST: 19 U/L (ref 15–41)
Albumin: 2.3 g/dL — ABNORMAL LOW (ref 3.5–5.0)
Alkaline Phosphatase: 66 U/L (ref 38–126)
Anion gap: 7 (ref 5–15)
BUN: 32 mg/dL — ABNORMAL HIGH (ref 8–23)
CO2: 28 mmol/L (ref 22–32)
Calcium: 8.5 mg/dL — ABNORMAL LOW (ref 8.9–10.3)
Chloride: 101 mmol/L (ref 98–111)
Creatinine, Ser: 0.8 mg/dL (ref 0.61–1.24)
GFR calc Af Amer: >60 mL/min (ref >60)
GFR calc non Af Amer: >60 mL/min (ref >60)
Glucose, Bld: 61 mg/dL — ABNORMAL LOW (ref 70–99)
Potassium: 4.5 mmol/L (ref 3.5–5.1)
Sodium: 136 mmol/L (ref 135–145)
Total Bilirubin: 0.8 mg/dL (ref 0.3–1.2)
Total Protein: 4.7 g/dL — ABNORMAL LOW (ref 6.5–8.1)

## 2019-08-21 LAB — C-REACTIVE PROTEIN: CRP: 0.6 mg/dL (ref <1.0)

## 2019-08-21 LAB — CBC WITH DIFFERENTIAL/PLATELET
Abs Immature Granulocytes: 0.25 10*3/uL — ABNORMAL HIGH (ref 0.00–0.07)
Basophils Absolute: 0 10*3/uL (ref 0.0–0.1)
Basophils Relative: 0 %
Eosinophils Absolute: 0 10*3/uL (ref 0.0–0.5)
Eosinophils Relative: 0 %
HCT: 36.6 % — ABNORMAL LOW (ref 39.0–52.0)
Hemoglobin: 12.1 g/dL — ABNORMAL LOW (ref 13.0–17.0)
Immature Granulocytes: 2 %
Lymphocytes Relative: 2 %
Lymphs Abs: 0.4 10*3/uL — ABNORMAL LOW (ref 0.7–4.0)
MCH: 32 pg (ref 26.0–34.0)
MCHC: 33.1 g/dL (ref 30.0–36.0)
MCV: 96.8 fL (ref 80.0–100.0)
Monocytes Absolute: 0.4 10*3/uL (ref 0.1–1.0)
Monocytes Relative: 3 %
Neutro Abs: 13.5 10*3/uL — ABNORMAL HIGH (ref 1.7–7.7)
Neutrophils Relative %: 93 %
Platelets: 149 10*3/uL — ABNORMAL LOW (ref 150–400)
RBC: 3.78 MIL/uL — ABNORMAL LOW (ref 4.22–5.81)
RDW: 13.3 % (ref 11.5–15.5)
WBC: 14.5 10*3/uL — ABNORMAL HIGH (ref 4.0–10.5)
nRBC: 0 % (ref 0.0–0.2)

## 2019-08-21 LAB — MAGNESIUM: Magnesium: 2.3 mg/dL (ref 1.7–2.4)

## 2019-08-21 LAB — D-DIMER, QUANTITATIVE: D-Dimer, Quant: 9.64 ug/mL-FEU — ABNORMAL HIGH (ref 0.00–0.50)

## 2019-08-21 LAB — FERRITIN: Ferritin: 353 ng/mL — ABNORMAL HIGH (ref 24–336)

## 2019-08-21 LAB — PHOSPHORUS: Phosphorus: 4.8 mg/dL — ABNORMAL HIGH (ref 2.5–4.6)

## 2019-08-21 NOTE — Progress Notes (Signed)
PROGRESS NOTE    Aaron Blair  JEH:[id number] DOB: Aug 14, 1933 DOA: 08/06/2019 PCP: No primary care provider on file.   Brief Narrative:  ([place] speaker) 86 y.o.  Asian male, PMHx  diabetes type II uncontrolled with complication,  HTN, HLD   Presents as a transfer from [facility] for management of respiratory failure with hypoxia.  Patient does not speak [place], history obtained from family over the phone.  Over the past week patient has had subjective fevers with chills, poor appetite and started having shortness of breath with lethargy today prompting the ER presentation.  In the ER he was found to have oxygen saturation in the 50s on room air, CXR consistent with viral pneumonia, SARS-CoV-2 positive.  Patient transferred for further management.  No reported nausea, vomiting, diarrhea.  He has intermittent cough which is nonproductive.  No reported chest pain, palpitations, orthopnea, PND, lower extremity edema.    Subjective: 1/9 afebrile last 24 hours A/O x4, (some short-term memory loss) negative CP, positive S OB.  Again request to know when he can go home.   Assessment & Plan:   Principal Problem:   Acute hypoxemic respiratory failure due to severe acute respiratory syndrome coronavirus 2 (SARS-CoV-2) disease (HCC) Active Problems:   Chronic diastolic CHF (congestive heart failure) ([place])   Hypertension   Hyperlipidemia   Demand ischemia ([place])   Pneumonia due to COVID-19 virus   Uncontrolled type 2 diabetes mellitus with complication (HCC)   Elevated troponin   Essential hypertension  Covid pneumonia/acute respiratory failure with hypoxia COVID-19 Labs Recent Labs    08/19/19 0040 08/20/19 0100 08/21/19 0430  DDIMER 10.67* 10.51* 9.64*  FERRITIN 353* 327 353*  CRP <0.5 0.7 0.6    12/29 SARS coronavirus positive  -Decadron 6 mg Daily -Remdesivir per pharmacy protocol -Combivent QID  -vitamins per Covid protocol -Flutter valve -Incentive spirometry -12/30 Actemra  x1 dose -1/2 Lasix IV 60 mg x 2 dose -1/7 Actemra times second dose  Diabetes type 2 uncontrolled with complication -37/85 hemoglobin A1c= 9.8 -1/1 Endo tool until patient's CBG back under control -1/7 increase Levemir 10 units BID -1/8 increase NovoLog 12 units qac  -resistant SSI  Elevated D-dimer -CTA PE protocol negative see results below -Bilateral lower extremity Doppler negative DVT -1/7 D-dimer increasing with increasing S OB. Repeat CTA PE protocol negative PE, see results below  Chest pain/elevated troponin/demand ischemia -EKG pending -Eechocardiogram; not consistent with ACS/MI see results below  Essential HTN -Amlodipine 5 mg daily -Lisinopril lisinopril 20 mg daily  Hypokalemia -Potassium goal> 4    DVT prophylaxis: Lovenox Code Status: DNR Family Communication: 08/19/2019 spoke with Leigh Aurora (son) explained plan of care answered all questions Disposition Plan: TBD   Consultants:    Procedures/Significant Events:  12/30 Echocardiogram Left Ventricle: EV= 65 to 70%. - moderately increased LVH Aortic Valve: Severely calcified AoV with focal calcification of the NCC. Restricted movement of the [facility] 12/30 CTA chest PE protocol;.-Extensive ground-glass opacities in keeping with history of COVID-19 positivity. 12/30 Actemra x1 dose  Motion degraded study with no evidence of pulmonary embolism. 1/2 bilateral lower extremity Doppler; negative DVT 1/3 PCXR; 1/7 Actemra times second dose 1/7 CTA PE protocol;-negative PE  -Mildly improved multifocal pneumonia related to COVID-19 infection. -Emphysema     I have personally reviewed and interpreted all radiology studies and my findings are as above.  VENTILATOR SETTINGS: HFNC 1/9 Flow; 15 L/min SPO2 96%   Cultures   Antimicrobials: Anti-infectives (From admission, onward)   Start  Dose/Rate Stop   08/11/19 1000  remdesivir 100 mg in sodium chloride 0.9 % 100 mL IVPB     100 mg 200 mL/hr over 30 Minutes  08/15/19 0959   August 26, 2019 2230  remdesivir 200 mg in sodium chloride 0.9% 250 mL IVPB     200 mg 580 mL/hr over 30 Minutes 26-Aug-2019 2251   26-Aug-2019 1745  cefTRIAXone (ROCEPHIN) 1 g in sodium chloride 0.9 % 100 mL IVPB     1 g 200 mL/hr over 30 Minutes 08/26/2019 1945   08/26/19 1745  azithromycin (ZITHROMAX) 500 mg in sodium chloride 0.9 % 250 mL IVPB     500 mg 250 mL/hr over 60 Minutes 2019-08-26 2057       Devices    LINES / TUBES:      Continuous Infusions: . sodium chloride Stopped (2019-08-26 2259)     Objective: Vitals:   08/20/19 1935 08/20/19 2347 08/21/19 0349 08/21/19 0726  BP: 107/61 135/67 114/62 136/68  Pulse:  82    Resp:  16    Temp: (!) 97.5 F (36.4 C) (!) 97.5 F (36.4 C) (!) 97.3 F (36.3 C) 97.7 F (36.5 C)  TempSrc: Oral Oral Axillary Oral  SpO2:  100%    Weight:      Height:        Intake/Output Summary (Last 24 hours) at 08/21/2019 1147 Last data filed at 08/21/2019 0400 Gross per 24 hour  Intake 840 ml  Output 900 ml  Net -60 ml   Filed Weights   08-26-19 1711  Weight: 63.5 kg    Physical Exam:  General: A/O x4, positive acute respiratory distress Eyes: negative scleral hemorrhage, negative anisocoria, negative icterus ENT: Negative Runny nose, negative gingival bleeding, Neck:  Negative scars, masses, torticollis, lymphadenopathy, JVD Lungs: Clear to auscultation bilaterally without wheezes or crackles Cardiovascular: Regular rate and rhythm without murmur gallop or rub normal S1 and S2 Abdomen: negative abdominal pain, nondistended, positive soft, bowel sounds, no rebound, no ascites, no appreciable mass Extremities: No significant cyanosis, clubbing, or edema bilateral lower extremities Skin: Negative rashes, lesions, ulcers Psychiatric: Positive depression, negative anxiety, negative fatigue, negative mania  Central nervous system:  Cranial nerves II through XII intact, tongue/uvula midline, all extremities muscle strength 5/5,  sensation intact throughout, negative dysarthria, negative expressive aphasia, negative receptive aphasia.    .     Data Reviewed: Care during the described time interval was provided by me .  I have reviewed this patient's available data, including medical history, events of note, physical examination, and all test results as part of my evaluation.   CBC: Recent Labs  Lab 08/15/19 0146 08/19/19 0040 08/20/19 0100 08/21/19 0430  WBC 15.3* 14.3* 12.3* 14.5*  NEUTROABS 13.6* 12.9* 11.4* 13.5*  HGB 12.5* 12.0* 12.2* 12.1*  HCT 35.8* 35.7* 36.5* 36.6*  MCV 92.3 96.0 96.8 96.8  PLT 234 192 170 149*   Basic Metabolic Panel: Recent Labs  Lab 08/17/19 0205 08/18/19 0745 08/19/19 0040 08/20/19 0100 08/21/19 0430  NA 135 151* 135 135 136  K 4.1 4.2 4.4 4.5 4.5  CL 97* 119* 100 98 101  CO2 30 23 27 26 28   GLUCOSE 96 114* 82 149* 61*  BUN 32* 47* 33* 26* 32*  CREATININE 0.80 1.22 0.79 0.77 0.80  CALCIUM 8.7* 9.6 8.5* 8.7* 8.5*  MG 2.0 2.5* 2.2 2.2 2.3  PHOS 4.1 3.3 3.8 3.7 4.8*   GFR: Estimated Creatinine Clearance: 56.3 mL/min (by C-G formula based on SCr of  0.8 mg/dL). Liver Function Tests: Recent Labs  Lab 08/15/19 0146 08/19/19 0040 08/20/19 0100 08/21/19 0430  AST 25 21 22 19   ALT 28 27 25 22   ALKPHOS 121 68 76 66  BILITOT 0.9 0.7 0.8 0.8  PROT 5.2* 4.7* 4.8* 4.7*  ALBUMIN 2.5* 2.2* 2.4* 2.3*   No results for input(s): LIPASE, AMYLASE in the last 168 hours. No results for input(s): AMMONIA in the last 168 hours. Coagulation Profile: No results for input(s): INR, PROTIME in the last 168 hours. Cardiac Enzymes: No results for input(s): CKTOTAL, CKMB, CKMBINDEX, TROPONINI in the last 168 hours. BNP (last 3 results) No results for input(s): PROBNP in the last 8760 hours. HbA1C: No results for input(s): HGBA1C in the last 72 hours. CBG: Recent Labs  Lab 08/20/19 0746 08/20/19 1126 08/20/19 1608 08/20/19 2041 08/21/19 0811  GLUCAP 93 193* 305* 179* 101*    Lipid Profile: No results for input(s): CHOL, HDL, LDLCALC, TRIG, CHOLHDL, LDLDIRECT in the last 72 hours. Thyroid Function Tests: No results for input(s): TSH, T4TOTAL, FREET4, T3FREE, THYROIDAB in the last 72 hours. Anemia Panel: Recent Labs    08/20/19 0100 08/21/19 0430  FERRITIN 327 353*   Urine analysis:    Component Value Date/Time   COLORURINE YELLOW 06/01/2019 0853   APPEARANCEUR CLEAR 06/01/2019 0853   LABSPEC 1.020 06/01/2019 0853   PHURINE 6.5 06/01/2019 0853   GLUCOSEU 250 (A) 06/01/2019 0853   HGBUR NEGATIVE 06/01/2019 0853   BILIRUBINUR NEGATIVE 06/01/2019 0853   KETONESUR NEGATIVE 06/01/2019 0853   PROTEINUR 100 (A) 12/15/2016 1216   UROBILINOGEN 0.2 06/01/2019 0853   NITRITE NEGATIVE 06/01/2019 0853   LEUKOCYTESUR NEGATIVE 06/01/2019 0853   Sepsis Labs: @LABRCNTIP (procalcitonin:4,lacticidven:4)  ) No results found for this or any previous visit (from the past 240 hour(s)).       Radiology Studies: CT ANGIO CHEST PE W OR WO CONTRAST  Result Date: 08/19/2019 CLINICAL DATA:  Worsening shortness of breath. Increased D-dimer. Evaluate for pulmonary embolism. COVID-19 pneumonia. EXAM: CT ANGIOGRAPHY CHEST WITH CONTRAST TECHNIQUE: Multidetector CT imaging of the chest was performed using the standard protocol during bolus administration of intravenous contrast. Multiplanar CT image reconstructions and MIPs were obtained to evaluate the vascular anatomy. CONTRAST:  50mL OMNIPAQUE IOHEXOL 350 MG/ML SOLN COMPARISON:  CTA chest dated August 11, 2019. FINDINGS: Cardiovascular: Limited examination due to respiratory motion artifact. No definite pulmonary embolism. Normal heart size. No pericardial effusion. No thoracic aortic aneurysm or dissection. Coronary, aortic arch, and branch vessel atherosclerotic vascular disease. Unchanged noncalcified mural plaque in the descending thoracic aorta. Mediastinum/Nodes: No enlarged mediastinal, hilar, or axillary lymph nodes.  Thyroid gland, trachea, and esophagus demonstrate no significant findings. Lungs/Pleura: Diffuse ground-glass densities throughout both lungs has mildly improved. No pleural effusion or pneumothorax. Advanced emphysema again noted. Upper Abdomen: No acute abnormality. Musculoskeletal: No chest wall abnormality. No acute or significant osseous findings. Review of the MIP images confirms the above findings. IMPRESSION: 1. Limited examination due to respiratory motion artifact. No definite pulmonary embolism. 2. Mildly improved multifocal pneumonia related to COVID-19 infection. 3.  Emphysema (ICD10-J43.9). 4.  Aortic atherosclerosis (ICD10-I70.0). Electronically Signed   By: 10/17/2019 M.D.   On: 08/19/2019 18:58        Scheduled Meds: . amLODipine  5 mg Oral Daily  . vitamin C  500 mg Oral Daily  . aspirin EC  81 mg Oral Daily  . Chlorhexidine Gluconate Cloth  6 each Topical Daily  . docusate sodium  100 mg Oral  Daily  . enoxaparin (LOVENOX) injection  40 mg Subcutaneous Q24H  . insulin aspart  0-20 Units Subcutaneous TID WC  . insulin aspart  0-5 Units Subcutaneous QHS  . insulin aspart  12 Units Subcutaneous TID WC  . insulin detemir  10 Units Subcutaneous BID  . Ipratropium-Albuterol  1 puff Inhalation QID  . lisinopril  20 mg Oral Daily  . mupirocin ointment   Nasal BID  . zinc sulfate  220 mg Oral Daily   Continuous Infusions: . sodium chloride Stopped (20-Aug-2019 2259)     LOS: 11 days   The patient is critically ill with multiple organ systems failure and requires high complexity decision making for assessment and support, frequent evaluation and titration of therapies, application of advanced monitoring technologies and extensive interpretation of multiple databases. Critical Care Time devoted to patient care services described in this note  Time spent: 40 minutes     Rechelle Niebla, Roselind Messier, MD [facility] Pager [phone]  If 7PM-7AM, please contact  night-coverage [email or web address] Password [facility] 08/21/2019, 11:47 AM

## 2019-08-22 ENCOUNTER — Inpatient Hospital Stay (HOSPITAL_COMMUNITY): Payer: Medicare Other

## 2019-08-22 LAB — COMPREHENSIVE METABOLIC PANEL
ALT: 20 U/L (ref 0–44)
AST: 19 U/L (ref 15–41)
Albumin: 2.5 g/dL — ABNORMAL LOW (ref 3.5–5.0)
Alkaline Phosphatase: 78 U/L (ref 38–126)
Anion gap: 9 (ref 5–15)
BUN: 40 mg/dL — ABNORMAL HIGH (ref 8–23)
CO2: 27 mmol/L (ref 22–32)
Calcium: 8.7 mg/dL — ABNORMAL LOW (ref 8.9–10.3)
Chloride: 100 mmol/L (ref 98–111)
Creatinine, Ser: 1.03 mg/dL (ref 0.61–1.24)
GFR calc Af Amer: >60 mL/min (ref >60)
GFR calc non Af Amer: >60 mL/min (ref >60)
Glucose, Bld: 103 mg/dL — ABNORMAL HIGH (ref 70–99)
Potassium: 4.5 mmol/L (ref 3.5–5.1)
Sodium: 136 mmol/L (ref 135–145)
Total Bilirubin: 1.2 mg/dL (ref 0.3–1.2)
Total Protein: 5 g/dL — ABNORMAL LOW (ref 6.5–8.1)

## 2019-08-22 LAB — PHOSPHORUS: Phosphorus: 4.5 mg/dL (ref 2.5–4.6)

## 2019-08-22 LAB — CBC WITH DIFFERENTIAL/PLATELET
Abs Immature Granulocytes: 0.2 10*3/uL — ABNORMAL HIGH (ref 0.00–0.07)
Basophils Absolute: 0 10*3/uL (ref 0.0–0.1)
Basophils Relative: 0 %
Eosinophils Absolute: 0.3 10*3/uL (ref 0.0–0.5)
Eosinophils Relative: 2 %
HCT: 37.5 % — ABNORMAL LOW (ref 39.0–52.0)
Hemoglobin: 12.5 g/dL — ABNORMAL LOW (ref 13.0–17.0)
Immature Granulocytes: 1 %
Lymphocytes Relative: 4 %
Lymphs Abs: 0.5 10*3/uL — ABNORMAL LOW (ref 0.7–4.0)
MCH: 32.4 pg (ref 26.0–34.0)
MCHC: 33.3 g/dL (ref 30.0–36.0)
MCV: 97.2 fL (ref 80.0–100.0)
Monocytes Absolute: 0.2 10*3/uL (ref 0.1–1.0)
Monocytes Relative: 1 %
Neutro Abs: 13.3 10*3/uL — ABNORMAL HIGH (ref 1.7–7.7)
Neutrophils Relative %: 92 %
Platelets: 164 10*3/uL (ref 150–400)
RBC: 3.86 MIL/uL — ABNORMAL LOW (ref 4.22–5.81)
RDW: 13.7 % (ref 11.5–15.5)
WBC: 14.6 10*3/uL — ABNORMAL HIGH (ref 4.0–10.5)
nRBC: 0 % (ref 0.0–0.2)

## 2019-08-22 LAB — FERRITIN: Ferritin: 385 ng/mL — ABNORMAL HIGH (ref 24–336)

## 2019-08-22 LAB — GLUCOSE, CAPILLARY
Glucose-Capillary: 66 mg/dL — ABNORMAL LOW (ref 70–99)
Glucose-Capillary: 245 mg/dL — ABNORMAL HIGH (ref 70–99)
Glucose-Capillary: 73 mg/dL (ref 70–99)
Glucose-Capillary: 336 mg/dL — ABNORMAL HIGH (ref 70–99)

## 2019-08-22 LAB — LACTIC ACID, PLASMA
Lactic Acid, Venous: 2.4 mmol/L (ref 0.5–1.9)
Lactic Acid, Venous: 2.9 mmol/L (ref 0.5–1.9)

## 2019-08-22 LAB — D-DIMER, QUANTITATIVE: D-Dimer, Quant: 10.16 ug/mL-FEU — ABNORMAL HIGH (ref 0.00–0.50)

## 2019-08-22 LAB — C-REACTIVE PROTEIN: CRP: 0.6 mg/dL (ref <1.0)

## 2019-08-22 LAB — MAGNESIUM: Magnesium: 2.2 mg/dL (ref 1.7–2.4)

## 2019-08-22 LAB — PROCALCITONIN: Procalcitonin: <0.1 ng/mL

## 2019-08-22 MED ORDER — SODIUM CHLORIDE 0.9 % IV BOLUS
1000.0000 mL | Freq: Once | INTRAVENOUS | Status: AC
  Administered 2019-08-22: 1000 mL via INTRAVENOUS

## 2019-08-22 MED ORDER — DEXAMETHASONE SODIUM PHOSPHATE 10 MG/ML IJ SOLN
6.0000 mg | INTRAMUSCULAR | Status: DC
  Administered 2019-08-22 – 2019-08-23 (×2): 6 mg via INTRAVENOUS
  Filled 2019-08-22 (×2): qty 1

## 2019-08-22 NOTE — Progress Notes (Addendum)
PROGRESS NOTE    JAKHARI SPACE  UVO:[id number] DOB: 1934/06/21 DOA: 08/24/2019 PCP: No primary care provider on file.   Brief Narrative:  ([place] speaker) 86 y.o.  Asian male, PMHx  diabetes type II uncontrolled with complication,  HTN, HLD   Presents as a transfer from [facility] for management of respiratory failure with hypoxia.  Patient does not speak [place], history obtained from family over the phone.  Over the past week patient has had subjective fevers with chills, poor appetite and started having shortness of breath with lethargy today prompting the ER presentation.  In the ER he was found to have oxygen saturation in the 50s on room air, CXR consistent with viral pneumonia, SARS-CoV-2 positive.  Patient transferred for further management.  No reported nausea, vomiting, diarrhea.  He has intermittent cough which is nonproductive.  No reported chest pain, palpitations, orthopnea, PND, lower extremity edema.    Subjective: 1/10 A/O x4, positive S OB    Assessment & Plan:   Principal Problem:   Acute hypoxemic respiratory failure due to severe acute respiratory syndrome coronavirus 2 (SARS-CoV-2) disease (HCC) Active Problems:   Chronic diastolic CHF (congestive heart failure) (HCC)   Hypertension   Hyperlipidemia   Demand ischemia (HCC)   Pneumonia due to COVID-19 virus   Uncontrolled type 2 diabetes mellitus with complication (HCC)   Elevated troponin   Essential hypertension  Covid pneumonia/acute respiratory failure with hypoxia COVID-19 Labs Recent Labs    08/20/19 0100 08/21/19 0430 08/22/19 0441  DDIMER 10.51* 9.64* 10.16*  FERRITIN 327 353* 385*  CRP 0.7 0.6 0.6    12/29 SARS coronavirus positive  -Decadron 6 mg Daily -Remdesivir per pharmacy protocol -Combivent QID  -vitamins per Covid protocol -Flutter valve -Incentive spirometry -12/30 Actemra x1 dose -1/2 Lasix IV 60 mg x 2 dose -1/7 Actemra times second dose -Prone 16 hours/day, if patient cannot  tolerate it prone 2 to 3 hours per shift -Titrate O2 to maintain SPO2> 88%  Diabetes type 2 uncontrolled with complication -10/20 hemoglobin A1c= 9.8 -1/1 Endo tool until patient's CBG back under control -1/7 increase Levemir 10 units BID -1/8 increase NovoLog 12 units qac  -resistant SSI  Elevated D-dimer -CTA PE protocol negative see results below -Bilateral lower extremity Doppler negative DVT -1/7 D-dimer increasing with increasing S OB. Repeat CTA PE protocol negative PE, see results below  Chest pain/elevated troponin/demand ischemia -EKG pending -Eechocardiogram; not consistent with ACS/MI see results below  Essential HTN -Amlodipine 5 mg daily -Lisinopril lisinopril 20 mg daily  Chronic diastolic CHF -See HTN -Strict in -6.7 L -Daily weight  Hypokalemia -Potassium goal> 4    DVT prophylaxis: Lovenox Code Status: DNR Family Communication: 08/19/2019 spoke with Emelda Fear (son) explained plan of care answered all questions Disposition Plan: TBD   Consultants:    Procedures/Significant Events:  12/30 Echocardiogram Left Ventricle: EV= 65 to 70%. - moderately increased LVH Aortic Valve: Severely calcified AoV with focal calcification of the NCC. Restricted movement of the [facility] 12/30 CTA chest PE protocol;.-Extensive ground-glass opacities in keeping with history of COVID-19 positivity. 12/30 Actemra x1 dose  Motion degraded study with no evidence of pulmonary embolism. 1/2 bilateral lower extremity Doppler; negative DVT 1/3 PCXR; 1/7 Actemra times second dose 1/7 CTA PE protocol;-negative PE  -Mildly improved multifocal pneumonia related to COVID-19 infection. -Emphysema     I have personally reviewed and interpreted all radiology studies and my findings are as above.  VENTILATOR SETTINGS: HFNC+ NRB 1/10 Flow; 15 L/min SPO2 89%  Cultures   Antimicrobials: Anti-infectives (From admission, onward)   Start     Dose/Rate Stop   08/11/19 1000  remdesivir  100 mg in sodium chloride 0.9 % 100 mL IVPB     100 mg 200 mL/hr over 30 Minutes 08/15/19 0959   07/30/2019 2230  remdesivir 200 mg in sodium chloride 0.9% 250 mL IVPB     200 mg 580 mL/hr over 30 Minutes 07/14/2019 2251   07/17/2019 1745  cefTRIAXone (ROCEPHIN) 1 g in sodium chloride 0.9 % 100 mL IVPB     1 g 200 mL/hr over 30 Minutes 07/13/2019 1945   07/22/2019 1745  azithromycin (ZITHROMAX) 500 mg in sodium chloride 0.9 % 250 mL IVPB     500 mg 250 mL/hr over 60 Minutes 07/19/2019 2057       Devices    LINES / TUBES:      Continuous Infusions: . sodium chloride Stopped (07/26/2019 2259)     Objective: Vitals:   08/21/19 2000 08/22/19 0100 08/22/19 0400 08/22/19 0732  BP: (!) 118/57 116/78 (!) 109/57 (!) 100/59  Pulse: 90 95 100   Resp: (!) 22 (!) 23 (!) 21   Temp: 98.1 F (36.7 C) 98.3 F (36.8 C) 97.9 F (36.6 C) 98.3 F (36.8 C)  TempSrc: Oral Oral Oral Oral  SpO2: 90% (!) 89% (!) 86%   Weight:      Height:       No intake or output data in the 24 hours ending 08/22/19 1033 Filed Weights   08/12/2019 1711  Weight: 63.5 kg  Physical Exam:  General: A/O x4, positive acute respiratory distress Eyes: negative scleral hemorrhage, negative anisocoria, negative icterus ENT: Negative Runny nose, negative gingival bleeding, Neck:  Negative scars, masses, torticollis, lymphadenopathy, JVD Lungs: Clear to auscultation bilaterally without wheezes or crackles Cardiovascular: Regular rate and rhythm without murmur gallop or rub normal S1 and S2 Abdomen: negative abdominal pain, nondistended, positive soft, bowel sounds, no rebound, no ascites, no appreciable mass Extremities: No significant cyanosis, clubbing, or edema bilateral lower extremities Skin: Negative rashes, lesions, ulcers Psychiatric:  Negative depression, negative anxiety, negative fatigue, negative mania  Central nervous system:  Cranial nerves II through XII intact, tongue/uvula midline, all extremities muscle  strength 5/5, sensation intact throughout,  negative dysarthria, negative expressive aphasia, negative receptive aphasia.   Data Reviewed: Care during the described time interval was provided by me .  I have reviewed this patient's available data, including medical history, events of note, physical examination, and all test results as part of my evaluation.   CBC: Recent Labs  Lab 08/19/19 0040 08/20/19 0100 08/21/19 0430 08/22/19 0441  WBC 14.3* 12.3* 14.5* 14.6*  NEUTROABS 12.9* 11.4* 13.5* 13.3*  HGB 12.0* 12.2* 12.1* 12.5*  HCT 35.7* 36.5* 36.6* 37.5*  MCV 96.0 96.8 96.8 97.2  PLT 192 170 149* 164   Basic Metabolic Panel: Recent Labs  Lab 08/18/19 0745 08/19/19 0040 08/20/19 0100 08/21/19 0430 08/22/19 0441  NA 151* 135 135 136 136  K 4.2 4.4 4.5 4.5 4.5  CL 119* 100 98 101 100  CO2 23 27 26 28 27   GLUCOSE 114* 82 149* 61* 103*  BUN 47* 33* 26* 32* 40*  CREATININE 1.22 0.79 0.77 0.80 1.03  CALCIUM 9.6 8.5* 8.7* 8.5* 8.7*  MG 2.5* 2.2 2.2 2.3 2.2  PHOS 3.3 3.8 3.7 4.8* 4.5   GFR: Estimated Creatinine Clearance: 43.7 mL/min (by C-G formula based on SCr of 1.03 mg/dL). Liver Function Tests: Recent Labs  Lab 08/19/19 0040 08/20/19 0100 08/21/19 0430 08/22/19 0441  AST 21 22 19 19   ALT 27 25 22 20   ALKPHOS 68 76 66 78  BILITOT 0.7 0.8 0.8 1.2  PROT 4.7* 4.8* 4.7* 5.0*  ALBUMIN 2.2* 2.4* 2.3* 2.5*   No results for input(s): LIPASE, AMYLASE in the last 168 hours. No results for input(s): AMMONIA in the last 168 hours. Coagulation Profile: No results for input(s): INR, PROTIME in the last 168 hours. Cardiac Enzymes: No results for input(s): CKTOTAL, CKMB, CKMBINDEX, TROPONINI in the last 168 hours. BNP (last 3 results) No results for input(s): PROBNP in the last 8760 hours. HbA1C: No results for input(s): HGBA1C in the last 72 hours. CBG: Recent Labs  Lab 08/21/19 0811 08/21/19 1205 08/21/19 1603 08/21/19 1957 08/22/19 0741  GLUCAP 101* 294* 74 196*  66*   Lipid Profile: No results for input(s): CHOL, HDL, LDLCALC, TRIG, CHOLHDL, LDLDIRECT in the last 72 hours. Thyroid Function Tests: No results for input(s): TSH, T4TOTAL, FREET4, T3FREE, THYROIDAB in the last 72 hours. Anemia Panel: Recent Labs    08/21/19 0430 08/22/19 0441  FERRITIN 353* 385*   Urine analysis:    Component Value Date/Time   COLORURINE YELLOW 06/01/2019 [place] 06/01/2019 0853   LABSPEC 1.020 06/01/2019 0853   PHURINE 6.5 06/01/2019 0853   GLUCOSEU 250 (A) 06/01/2019 0853   HGBUR NEGATIVE 06/01/2019 0853   BILIRUBINUR NEGATIVE 06/01/2019 0853   [place] 06/01/2019 0853   PROTEINUR 100 (A) 12/15/2016 1216   UROBILINOGEN 0.2 06/01/2019 0853   NITRITE NEGATIVE 06/01/2019 0853   LEUKOCYTESUR NEGATIVE 06/01/2019 0853   Sepsis Labs: @LABRCNTIP (procalcitonin:4,lacticidven:4)  ) No results found for this or any previous visit (from the past 240 hour(s)).       Radiology Studies: No results found.      Scheduled Meds: . amLODipine  5 mg Oral Daily  . vitamin C  500 mg Oral Daily  . aspirin EC  81 mg Oral Daily  . Chlorhexidine Gluconate Cloth  6 each Topical Daily  . docusate sodium  100 mg Oral Daily  . enoxaparin (LOVENOX) injection  40 mg Subcutaneous Q24H  . insulin aspart  0-20 Units Subcutaneous TID WC  . insulin aspart  0-5 Units Subcutaneous QHS  . insulin aspart  12 Units Subcutaneous TID WC  . insulin detemir  10 Units Subcutaneous BID  . Ipratropium-Albuterol  1 puff Inhalation QID  . lisinopril  20 mg Oral Daily  . mupirocin ointment   Nasal BID  . zinc sulfate  220 mg Oral Daily   Continuous Infusions: . sodium chloride Stopped (07/25/2019 2259)     LOS: 12 days   The patient is critically ill with multiple organ systems failure and requires high complexity decision making for assessment and support, frequent evaluation and titration of therapies, application of advanced monitoring technologies  and extensive interpretation of multiple databases. Critical Care Time devoted to patient care services described in this note  Time spent: 40 minutes     Wreatha Sturgeon, Geraldo Docker, MD [facility] Pager [phone]  If 7PM-7AM, please contact night-coverage [email or web address] Password [facility] 08/22/2019, 10:33 AM

## 2019-08-23 LAB — GLUCOSE, CAPILLARY
Glucose-Capillary: 125 mg/dL — ABNORMAL HIGH (ref 70–99)
Glucose-Capillary: 199 mg/dL — ABNORMAL HIGH (ref 70–99)
Glucose-Capillary: 56 mg/dL — ABNORMAL LOW (ref 70–99)
Glucose-Capillary: 90 mg/dL (ref 70–99)
Glucose-Capillary: 228 mg/dL — ABNORMAL HIGH (ref 70–99)

## 2019-08-23 LAB — COMPREHENSIVE METABOLIC PANEL
ALT: 20 U/L (ref 0–44)
AST: 21 U/L (ref 15–41)
Albumin: 2.4 g/dL — ABNORMAL LOW (ref 3.5–5.0)
Alkaline Phosphatase: 81 U/L (ref 38–126)
Anion gap: 8 (ref 5–15)
BUN: 24 mg/dL — ABNORMAL HIGH (ref 8–23)
CO2: 25 mmol/L (ref 22–32)
Calcium: 8.4 mg/dL — ABNORMAL LOW (ref 8.9–10.3)
Chloride: 103 mmol/L (ref 98–111)
Creatinine, Ser: 0.71 mg/dL (ref 0.61–1.24)
GFR calc Af Amer: >60 mL/min (ref >60)
GFR calc non Af Amer: >60 mL/min (ref >60)
Glucose, Bld: 96 mg/dL (ref 70–99)
Potassium: 4.7 mmol/L (ref 3.5–5.1)
Sodium: 136 mmol/L (ref 135–145)
Total Bilirubin: 0.9 mg/dL (ref 0.3–1.2)
Total Protein: 5.1 g/dL — ABNORMAL LOW (ref 6.5–8.1)

## 2019-08-23 LAB — URINE CULTURE: Culture: NO GROWTH

## 2019-08-23 LAB — CBC WITH DIFFERENTIAL/PLATELET
Abs Immature Granulocytes: 0.3 10*3/uL — ABNORMAL HIGH (ref 0.00–0.07)
Basophils Absolute: 0 10*3/uL (ref 0.0–0.1)
Basophils Relative: 0 %
Eosinophils Absolute: 0 10*3/uL (ref 0.0–0.5)
Eosinophils Relative: 0 %
HCT: 35.7 % — ABNORMAL LOW (ref 39.0–52.0)
Hemoglobin: 12.2 g/dL — ABNORMAL LOW (ref 13.0–17.0)
Immature Granulocytes: 2 %
Lymphocytes Relative: 2 %
Lymphs Abs: 0.3 10*3/uL — ABNORMAL LOW (ref 0.7–4.0)
MCH: 33.6 pg (ref 26.0–34.0)
MCHC: 34.2 g/dL (ref 30.0–36.0)
MCV: 98.3 fL (ref 80.0–100.0)
Monocytes Absolute: 0.3 10*3/uL (ref 0.1–1.0)
Monocytes Relative: 2 %
Neutro Abs: 13.3 10*3/uL — ABNORMAL HIGH (ref 1.7–7.7)
Neutrophils Relative %: 94 %
Platelets: 122 10*3/uL — ABNORMAL LOW (ref 150–400)
RBC: 3.63 MIL/uL — ABNORMAL LOW (ref 4.22–5.81)
RDW: 13.5 % (ref 11.5–15.5)
WBC: 14.2 10*3/uL — ABNORMAL HIGH (ref 4.0–10.5)
nRBC: 0 % (ref 0.0–0.2)

## 2019-08-23 LAB — D-DIMER, QUANTITATIVE: D-Dimer, Quant: 11.94 ug/mL-FEU — ABNORMAL HIGH (ref 0.00–0.50)

## 2019-08-23 LAB — FERRITIN: Ferritin: 431 ng/mL — ABNORMAL HIGH (ref 24–336)

## 2019-08-23 LAB — C-REACTIVE PROTEIN: CRP: 1 mg/dL — ABNORMAL HIGH (ref <1.0)

## 2019-08-23 LAB — PHOSPHORUS: Phosphorus: 3.3 mg/dL (ref 2.5–4.6)

## 2019-08-23 LAB — MAGNESIUM: Magnesium: 2.1 mg/dL (ref 1.7–2.4)

## 2019-08-23 LAB — PROCALCITONIN: Procalcitonin: <0.1 ng/mL

## 2019-08-23 MED ORDER — FLUCONAZOLE 100MG IVPB
100.0000 mg | INTRAVENOUS | Status: DC
  Administered 2019-08-24 – 2019-08-25 (×2): 100 mg via INTRAVENOUS
  Filled 2019-08-23 (×5): qty 50

## 2019-08-23 MED ORDER — METHYLPREDNISOLONE SODIUM SUCC 125 MG IJ SOLR
80.0000 mg | Freq: Three times a day (TID) | INTRAMUSCULAR | Status: DC
  Administered 2019-08-23 – 2019-08-25 (×7): 80 mg via INTRAVENOUS
  Filled 2019-08-23 (×7): qty 2

## 2019-08-23 MED ORDER — FLUCONAZOLE IN SODIUM CHLORIDE 200-0.9 MG/100ML-% IV SOLN
200.0000 mg | Freq: Once | INTRAVENOUS | Status: AC
  Administered 2019-08-23: 200 mg via INTRAVENOUS
  Filled 2019-08-23: qty 100

## 2019-08-23 NOTE — Progress Notes (Signed)
Jae updated

## 2019-08-23 NOTE — Progress Notes (Signed)
PROGRESS NOTE    Aaron Blair  YHC:[id number] DOB: 10-30-1933 DOA: 2019-08-15 PCP: No primary care provider on file.   Brief Narrative:  ([place] speaker) 86 y.o.  Asian male, PMHx  diabetes type II uncontrolled with complication,  HTN, HLD   Presents as a transfer from [facility] for management of respiratory failure with hypoxia.  Patient does not speak [place], history obtained from family over the phone.  Over the past week patient has had subjective fevers with chills, poor appetite and started having shortness of breath with lethargy today prompting the ER presentation.  In the ER he was found to have oxygen saturation in the 50s on room air, CXR consistent with viral pneumonia, SARS-CoV-2 positive.  Patient transferred for further management.  No reported nausea, vomiting, diarrhea.  He has intermittent cough which is nonproductive.  No reported chest pain, palpitations, orthopnea, PND, lower extremity edema.    Subjective: 1/11 afebrile last 24 hours A/O x4, positive SOA   Assessment & Plan:   Principal Problem:   Acute hypoxemic respiratory failure due to severe acute respiratory syndrome coronavirus 2 (SARS-CoV-2) disease (HCC) Active Problems:   Chronic diastolic CHF (congestive heart failure) (HCC)   Hypertension   Hyperlipidemia   Demand ischemia (HCC)   Pneumonia due to COVID-19 virus   Uncontrolled type 2 diabetes mellitus with complication (HCC)   Elevated troponin   Essential hypertension  Covid pneumonia/acute respiratory failure with hypoxia COVID-19 Labs Recent Labs    08/21/19 0430 08/22/19 0441 08/23/19 0708  DDIMER 9.64* 10.16* 11.94*  FERRITIN 353* 385* 431*  CRP 0.6 0.6 1.0*    12/29 SARS coronavirus positive  -Decadron 6 mg Daily complete -Remdesivir per pharmacy protocol -Combivent QID  -vitamins per Covid protocol -Flutter valve -Incentive spirometry -12/30 Actemra x1 dose -1/2 Lasix IV 60 mg x 2 dose -1/7 Actemra times second dose -Prone  16 hours/day, if patient cannot tolerate it prone 2 to 3 hours per shift -Titrate O2 to maintain SPO2> 88%  Emphysema panlobular -See Covid -Patient with 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 -Decadron low-dose ineffective, will attempt high-dose Solu-Medrol 80 mg TID short course (4 days)  Diabetes type 2 uncontrolled with complication -10/20 hemoglobin A1c= 9.8 -1/1 Endo tool until patient's CBG back under control -1/7 increase Levemir 10 units BID -1/8 increase NovoLog 12 units qac  -resistant SSI  Elevated D-dimer -CTA PE protocol negative see results below -Bilateral lower extremity Doppler negative DVT -1/7 D-dimer increasing with increasing S OB. Repeat CTA PE protocol negative PE, see results below  Chest pain/elevated troponin/demand ischemia -EKG pending -Eechocardiogram; not consistent with ACS/MI see results below  Essential HTN -Amlodipine 5 mg daily -Lisinopril 20 mg daily  Chronic diastolic CHF -See HTN -Strict in -6.7 L -Daily weight  Hypokalemia -Potassium goal> 4    DVT prophylaxis: Lovenox Code Status: DNR Family Communication: 08/19/2019 spoke with Emelda Fear (son) explained plan of care answered all questions Disposition Plan: TBD   Consultants:    Procedures/Significant Events:  12/30 Echocardiogram Left Ventricle: EV= 65 to 70%. - moderately increased LVH Aortic Valve: Severely calcified AoV with focal calcification of the NCC. Restricted movement of the [facility] 12/30 CTA chest PE protocol;.-Extensive ground-glass opacities in keeping with history of COVID-19 positivity. 12/30 Actemra x1 dose  Motion degraded study with no evidence of pulmonary embolism. 1/2 bilateral lower extremity Doppler; negative DVT 1/3 PCXR; 1/7 Actemra times second dose 1/7 CTA PE protocol;-negative PE  -Mildly improved multifocal pneumonia related to COVID-19  infection. -Emphysema  1/10 PCXR;-extensive bilateral pulmonary  infiltrates appear essentially unchanged since the prior CT scan.  -Emphysematous changesin the upper lobes    I have personally reviewed and interpreted all radiology studies and my findings are as above.  VENTILATOR SETTINGS: HFNC 1/11 Flow; 15 L/min SPO2 98%   Cultures   Antimicrobials: Anti-infectives (From admission, onward)   Start     Dose/Rate Stop   08/11/19 1000  remdesivir 100 mg in sodium chloride 0.9 % 100 mL IVPB     100 mg 200 mL/hr over 30 Minutes 08/15/19 0959   07/23/2019 2230  remdesivir 200 mg in sodium chloride 0.9% 250 mL IVPB     200 mg 580 mL/hr over 30 Minutes 07/18/2019 2251   08/01/2019 1745  cefTRIAXone (ROCEPHIN) 1 g in sodium chloride 0.9 % 100 mL IVPB     1 g 200 mL/hr over 30 Minutes 07/27/2019 1945   08/12/2019 1745  azithromycin (ZITHROMAX) 500 mg in sodium chloride 0.9 % 250 mL IVPB     500 mg 250 mL/hr over 60 Minutes 08/05/2019 2057       Devices    LINES / TUBES:      Continuous Infusions: . sodium chloride Stopped (07/16/2019 2259)     Objective: Vitals:   08/23/19 0100 08/23/19 0500 08/23/19 0758 08/23/19 0927  BP: (!) 142/74 140/73 (!) 145/73 (!) 101/49  Pulse: 93 86 87   Resp:   18   Temp: 97.7 F (36.5 C) 97.9 F (36.6 C) 98.1 F (36.7 C)   TempSrc: Axillary Axillary Oral   SpO2: 94% 90% 94%   Weight:      Height:        Intake/Output Summary (Last 24 hours) at 08/23/2019 1109 Last data filed at 08/23/2019 3419 Gross per 24 hour  Intake 480 ml  Output 625 ml  Net -145 ml   Filed Weights   07/19/2019 1711  Weight: 63.5 kg    Physical Exam:  General: A/O x4, positive acute respiratory distress Eyes: negative scleral hemorrhage, negative anisocoria, negative icterus ENT: Negative Runny nose, negative gingival bleeding, Neck:  Negative scars, masses, torticollis, lymphadenopathy, JVD Lungs: Clear to auscultation bilaterally without wheezes or crackles Cardiovascular: Regular rate and rhythm without murmur gallop or  rub normal S1 and S2 Abdomen: negative abdominal pain, nondistended, positive soft, bowel sounds, no rebound, no ascites, no appreciable mass Extremities: No significant cyanosis, clubbing, or edema bilateral lower extremities Skin: Negative rashes, lesions, ulcers Psychiatric:  Negative depression, negative anxiety, negative fatigue, negative mania  Central nervous system:  Cranial nerves II through XII intact, tongue/uvula midline, all extremities muscle strength 5/5, sensation intact throughout,, negative dysarthria, negative expressive aphasia, negative receptive aphasia.     Data Reviewed: Care during the described time interval was provided by me .  I have reviewed this patient's available data, including medical history, events of note, physical examination, and all test results as part of my evaluation.   CBC: Recent Labs  Lab 08/19/19 0040 08/20/19 0100 08/21/19 0430 08/22/19 0441 08/23/19 0708  WBC 14.3* 12.3* 14.5* 14.6* 14.2*  NEUTROABS 12.9* 11.4* 13.5* 13.3* 13.3*  HGB 12.0* 12.2* 12.1* 12.5* 12.2*  HCT 35.7* 36.5* 36.6* 37.5* 35.7*  MCV 96.0 96.8 96.8 97.2 98.3  PLT 192 170 149* 164 122*   Basic Metabolic Panel: Recent Labs  Lab 08/19/19 0040 08/20/19 0100 08/21/19 0430 08/22/19 0441 08/23/19 0708  NA 135 135 136 136 136  K 4.4 4.5 4.5 4.5 4.7  CL  100 98 101 100 103  CO2 27 26 28 27 25   GLUCOSE 82 149* 61* 103* 96  BUN 33* 26* 32* 40* 24*  CREATININE 0.79 0.77 0.80 1.03 0.71  CALCIUM 8.5* 8.7* 8.5* 8.7* 8.4*  MG 2.2 2.2 2.3 2.2 2.1  PHOS 3.8 3.7 4.8* 4.5 3.3   GFR: Estimated Creatinine Clearance: 56.3 mL/min (by C-G formula based on SCr of 0.71 mg/dL). Liver Function Tests: Recent Labs  Lab 08/19/19 0040 08/20/19 0100 08/21/19 0430 08/22/19 0441 08/23/19 0708  AST 21 22 19 19 21   ALT 27 25 22 20 20   ALKPHOS 68 76 66 78 81  BILITOT 0.7 0.8 0.8 1.2 0.9  PROT 4.7* 4.8* 4.7* 5.0* 5.1*  ALBUMIN 2.2* 2.4* 2.3* 2.5* 2.4*   No results for  input(s): LIPASE, AMYLASE in the last 168 hours. No results for input(s): AMMONIA in the last 168 hours. Coagulation Profile: No results for input(s): INR, PROTIME in the last 168 hours. Cardiac Enzymes: No results for input(s): CKTOTAL, CKMB, CKMBINDEX, TROPONINI in the last 168 hours. BNP (last 3 results) No results for input(s): PROBNP in the last 8760 hours. HbA1C: No results for input(s): HGBA1C in the last 72 hours. CBG: Recent Labs  Lab 08/22/19 0741 08/22/19 1139 08/22/19 1548 08/22/19 1954 08/23/19 0756  GLUCAP 66* 245* 73 336* 125*   Lipid Profile: No results for input(s): CHOL, HDL, LDLCALC, TRIG, CHOLHDL, LDLDIRECT in the last 72 hours. Thyroid Function Tests: No results for input(s): TSH, T4TOTAL, FREET4, T3FREE, THYROIDAB in the last 72 hours. Anemia Panel: Recent Labs    08/22/19 0441 08/23/19 0708  FERRITIN 385* 431*   Urine analysis:    Component Value Date/Time   COLORURINE YELLOW 06/01/2019 [place] 06/01/2019 0853   LABSPEC 1.020 06/01/2019 0853   PHURINE 6.5 06/01/2019 0853   GLUCOSEU 250 (A) 06/01/2019 0853   HGBUR NEGATIVE 06/01/2019 0853   [place] 06/01/2019 0853   [place] 06/01/2019 0853   PROTEINUR 100 (A) 12/15/2016 1216   UROBILINOGEN 0.2 06/01/2019 0853   NITRITE NEGATIVE 06/01/2019 0853   LEUKOCYTESUR NEGATIVE 06/01/2019 0853   Sepsis Labs: @LABRCNTIP (procalcitonin:4,lacticidven:4)  ) Recent Results (from the past 240 hour(s))  Culture, blood (routine x 2)     Status: None (Preliminary result)   Collection Time: 08/22/19 11:08 AM   Specimen: BLOOD  Result Value Ref Range Status   Specimen Description   Final    BLOOD RIGHT ARM Performed at [facility], [place] [address], [place], [place] [address]    Special Requests   Final    BOTTLES DRAWN AEROBIC ONLY Blood Culture adequate volume Performed at [facility] [address], [place], [place]  [address]    Culture   Final    NO GROWTH < 24 HOURS Performed at [facility] [address], [place], [place] [address]    Report Status PENDING  Incomplete  Culture, blood (routine x 2)     Status: None (Preliminary result)   Collection Time: 08/22/19 11:15 AM   Specimen: BLOOD  Result Value Ref Range Status   Specimen Description   Final    BLOOD RIGHT HAND Performed at [facility] [address], [place], [place] [address]    Special Requests   Final    BOTTLES DRAWN AEROBIC ONLY Blood Culture adequate volume Performed at [facility] [address], [place], [place] [address]    Culture   Final  NO GROWTH < 24 HOURS Performed at [facility], [address], [place], [place] [address]    Report Status PENDING  Incomplete         Radiology Studies: DG CHEST PORT 1 VIEW  Result Date: 08/22/2019 CLINICAL DATA:  COVID pneumonia. Shortness of breath. EXAM: PORTABLE CHEST 1 VIEW COMPARISON:  Chest x-ray dated 08/15/2019 and CT angiogram of the chest dated 08/19/2019 FINDINGS: The heart size and pulmonary vascularity are normal. Aortic atherosclerosis. Extensive bilateral pulmonary infiltrates appear essentially unchanged since the prior CT scan. Emphysematous changes in the upper lobes. No acute bone abnormality. IMPRESSION: No change in the extensive bilateral pulmonary infiltrates since the prior CT scan. Aortic Atherosclerosis (ICD10-I70.0) and Emphysema (ICD10-J43.9). Electronically Signed   By: Francene Boyers M.D.   On: 08/22/2019 11:41        Scheduled Meds: . amLODipine  5 mg Oral Daily  . vitamin C  500 mg Oral Daily  . aspirin EC  81 mg Oral Daily  . Chlorhexidine Gluconate Cloth  6 each Topical Daily  . dexamethasone (DECADRON) injection  6 mg Intravenous Q24H  . docusate sodium  100 mg Oral Daily  . enoxaparin (LOVENOX) injection  40 mg Subcutaneous Q24H  . insulin aspart  0-20 Units Subcutaneous TID WC  .  insulin aspart  0-5 Units Subcutaneous QHS  . insulin aspart  12 Units Subcutaneous TID WC  . insulin detemir  10 Units Subcutaneous BID  . Ipratropium-Albuterol  1 puff Inhalation QID  . lisinopril  20 mg Oral Daily  . mupirocin ointment   Nasal BID  . zinc sulfate  220 mg Oral Daily   Continuous Infusions: . sodium chloride Stopped (08-12-2019 2259)     LOS: 13 days   The patient is critically ill with multiple organ systems failure and requires high complexity decision making for assessment and support, frequent evaluation and titration of therapies, application of advanced monitoring technologies and extensive interpretation of multiple databases. Critical Care Time devoted to patient care services described in this note  Time spent: 40 minutes     Jedd Schulenburg, Roselind Messier, MD [facility] Pager [phone]  If 7PM-7AM, please contact night-coverage [email or web address] Password [facility] 08/23/2019, 11:09 AM

## 2019-08-24 LAB — COMPREHENSIVE METABOLIC PANEL
ALT: 24 U/L (ref 0–44)
AST: 20 U/L (ref 15–41)
Albumin: 2.5 g/dL — ABNORMAL LOW (ref 3.5–5.0)
Alkaline Phosphatase: 77 U/L (ref 38–126)
Anion gap: 9 (ref 5–15)
BUN: 32 mg/dL — ABNORMAL HIGH (ref 8–23)
CO2: 26 mmol/L (ref 22–32)
Calcium: 8.8 mg/dL — ABNORMAL LOW (ref 8.9–10.3)
Chloride: 102 mmol/L (ref 98–111)
Creatinine, Ser: 0.9 mg/dL (ref 0.61–1.24)
GFR calc Af Amer: >60 mL/min (ref >60)
GFR calc non Af Amer: >60 mL/min (ref >60)
Glucose, Bld: 136 mg/dL — ABNORMAL HIGH (ref 70–99)
Potassium: 4.2 mmol/L (ref 3.5–5.1)
Sodium: 137 mmol/L (ref 135–145)
Total Bilirubin: 1 mg/dL (ref 0.3–1.2)
Total Protein: 5.3 g/dL — ABNORMAL LOW (ref 6.5–8.1)

## 2019-08-24 LAB — CBC WITH DIFFERENTIAL/PLATELET
Abs Immature Granulocytes: 0.14 10*3/uL — ABNORMAL HIGH (ref 0.00–0.07)
Basophils Absolute: 0 10*3/uL (ref 0.0–0.1)
Basophils Relative: 0 %
Eosinophils Absolute: 0 10*3/uL (ref 0.0–0.5)
Eosinophils Relative: 0 %
HCT: 36.6 % — ABNORMAL LOW (ref 39.0–52.0)
Hemoglobin: 12.7 g/dL — ABNORMAL LOW (ref 13.0–17.0)
Immature Granulocytes: 1 %
Lymphocytes Relative: 2 %
Lymphs Abs: 0.3 10*3/uL — ABNORMAL LOW (ref 0.7–4.0)
MCH: 33.8 pg (ref 26.0–34.0)
MCHC: 34.7 g/dL (ref 30.0–36.0)
MCV: 97.3 fL (ref 80.0–100.0)
Monocytes Absolute: 0.4 10*3/uL (ref 0.1–1.0)
Monocytes Relative: 2 %
Neutro Abs: 14.8 10*3/uL — ABNORMAL HIGH (ref 1.7–7.7)
Neutrophils Relative %: 95 %
Platelets: 152 10*3/uL (ref 150–400)
RBC: 3.76 MIL/uL — ABNORMAL LOW (ref 4.22–5.81)
RDW: 13.5 % (ref 11.5–15.5)
WBC: 15.6 10*3/uL — ABNORMAL HIGH (ref 4.0–10.5)
nRBC: 0 % (ref 0.0–0.2)

## 2019-08-24 LAB — GLUCOSE, CAPILLARY
Glucose-Capillary: 183 mg/dL — ABNORMAL HIGH (ref 70–99)
Glucose-Capillary: 308 mg/dL — ABNORMAL HIGH (ref 70–99)
Glucose-Capillary: 138 mg/dL — ABNORMAL HIGH (ref 70–99)
Glucose-Capillary: 104 mg/dL — ABNORMAL HIGH (ref 70–99)

## 2019-08-24 LAB — MAGNESIUM: Magnesium: 2.2 mg/dL (ref 1.7–2.4)

## 2019-08-24 LAB — D-DIMER, QUANTITATIVE: D-Dimer, Quant: 8.09 ug/mL-FEU — ABNORMAL HIGH (ref 0.00–0.50)

## 2019-08-24 LAB — PROCALCITONIN: Procalcitonin: <0.1 ng/mL

## 2019-08-24 LAB — FERRITIN: Ferritin: 353 ng/mL — ABNORMAL HIGH (ref 24–336)

## 2019-08-24 LAB — PHOSPHORUS: Phosphorus: 3.6 mg/dL (ref 2.5–4.6)

## 2019-08-24 LAB — C-REACTIVE PROTEIN: CRP: 0.6 mg/dL (ref <1.0)

## 2019-08-24 MED ORDER — ALBUMIN HUMAN 25 % IV SOLN
12.5000 g | Freq: Once | INTRAVENOUS | Status: AC
  Administered 2019-08-24: 12.5 g via INTRAVENOUS
  Filled 2019-08-24: qty 50

## 2019-08-24 MED ORDER — FUROSEMIDE 10 MG/ML IJ SOLN
80.0000 mg | Freq: Once | INTRAMUSCULAR | Status: AC
  Administered 2019-08-24: 21:00:00 80 mg via INTRAVENOUS
  Filled 2019-08-24: qty 8

## 2019-08-24 MED ORDER — ENOXAPARIN SODIUM 40 MG/0.4ML ~~LOC~~ SOLN
40.0000 mg | Freq: Two times a day (BID) | SUBCUTANEOUS | Status: DC
  Administered 2019-08-24 – 2019-09-03 (×21): 40 mg via SUBCUTANEOUS
  Filled 2019-08-24 (×22): qty 0.4

## 2019-08-24 MED ORDER — ALBUMIN HUMAN 25 % IV SOLN
25.0000 g | Freq: Once | INTRAVENOUS | Status: AC
  Administered 2019-08-24: 25 g via INTRAVENOUS
  Filled 2019-08-24: qty 50

## 2019-08-24 NOTE — Progress Notes (Addendum)
PROGRESS NOTE    Aaron Blair  XHB:[id number] DOB: 1934-08-07 DOA: August 26, 2019 PCP: No primary care provider on file.   Brief Narrative:  ([place] speaker) 86 y.o.  Asian male, PMHx  diabetes type II uncontrolled with complication,  HTN, HLD   Presents as a transfer from [facility] for management of respiratory failure with hypoxia.  Patient does not speak [place], history obtained from family over the phone.  Over the past week patient has had subjective fevers with chills, poor appetite and started having shortness of breath with lethargy today prompting the ER presentation.  In the ER he was found to have oxygen saturation in the 50s on room air, CXR consistent with viral pneumonia, SARS-CoV-2 positive.  Patient transferred for further management.  No reported nausea, vomiting, diarrhea.  He has intermittent cough which is nonproductive.  No reported chest pain, palpitations, orthopnea, PND, lower extremity edema.    Subjective: 1/12 afebrile last night A/O x4, positive S OB   Assessment & Plan:   Principal Problem:   Acute hypoxemic respiratory failure due to severe acute respiratory syndrome coronavirus 2 (SARS-CoV-2) disease (HCC) Active Problems:   Chronic diastolic CHF (congestive heart failure) (HCC)   Hypertension   Hyperlipidemia   Demand ischemia (HCC)   Pneumonia due to COVID-19 virus   Uncontrolled type 2 diabetes mellitus with complication (HCC)   Elevated troponin   Essential hypertension  Covid pneumonia/acute respiratory failure with hypoxia COVID-19 Labs Recent Labs    08/22/19 0441 08/23/19 0708 08/24/19 0436  DDIMER 10.16* 11.94* 8.09*  FERRITIN 385* 431* 353*  CRP 0.6 1.0* 0.6    12/29 SARS coronavirus positive  -Decadron 6 mg Daily complete -Remdesivir per pharmacy protocol -Combivent QID  -vitamins per Covid protocol -Flutter valve -Incentive spirometry -12/30 Actemra x1 dose -1/2 Lasix IV 60 mg x 2 dose -1/7 Actemra times second dose -Prone 16  hours/day, if patient cannot tolerate it prone 2 to 3 hours per shift -Titrate O2 to maintain SPO2> 88% -1/12 Albumin 25 g+ Lasix IV 60 mg  Emphysema panlobular -See Covid -Patient with 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 -Decadron low-dose ineffective, will attempt high-dose Solu-Medrol 80 mg TID short course (4 days)  Diabetes type 2 uncontrolled with complication -10/20 hemoglobin A1c= 9.8 -1/1 Endo tool until patient's CBG back under control -1/7 increase Levemir 10 units BID -1/8 increase NovoLog 12 units qac  -resistant SSI  Elevated D-dimer -CTA PE protocol negative see results below -Bilateral lower extremity Doppler negative DVT -1/7 D-dimer increasing with increasing S OB. Repeat CTA PE protocol negative PE, see results below  Chest pain/elevated troponin/demand ischemia -EKG pending -Eechocardiogram; not consistent with ACS/MI see results below  Essential HTN -Amlodipine 5 mg daily -Lisinopril 20 mg daily  Chronic diastolic CHF -See HTN -Strict in -6.7 L -Daily weight  Hypokalemia -Potassium goal> 4  Goals of care -1/12 NCM/Palliative Care Consult; son has explained that mother and patient having extremely difficult time with this disease secondary to multiple issues, language barrier, food difference, inability to have physical contact.  Question once patient is considered noninfective (1/19), is there an acute care facility or ward within [facility] Which would allow visitation?    DVT prophylaxis: Lovenox Code Status: DNR Family Communication: 08/24/2019 spoke with Emelda Fear (son) explained plan of care answered all questions Disposition Plan: TBD   Consultants:    Procedures/Significant Events:  12/30 Echocardiogram Left Ventricle: EV= 65 to 70%. - moderately increased LVH Aortic Valve: Severely calcified AoV with  focal calcification of the NCC. Restricted movement of the [facility] 12/30 CTA chest PE protocol;.-Extensive  ground-glass opacities in keeping with history of COVID-19 positivity. 12/30 Actemra x1 dose  Motion degraded study with no evidence of pulmonary embolism. 1/2 bilateral lower extremity Doppler; negative DVT 1/3 PCXR; 1/7 Actemra times second dose 1/7 CTA PE protocol;-negative PE  -Mildly improved multifocal pneumonia related to COVID-19 infection. -Emphysema  1/10 PCXR;-extensive bilateral pulmonary infiltrates appear essentially unchanged since the prior CT scan.  -Emphysematous changesin the upper lobes    I have personally reviewed and interpreted all radiology studies and my findings are as above.  VENTILATOR SETTINGS: HFNC+NRB 1/12 Flow; 15 L/min SPO2 96%   Cultures   Antimicrobials: Anti-infectives (From admission, onward)   Start     Dose/Rate Stop   08/11/19 1000  remdesivir 100 mg in sodium chloride 0.9 % 100 mL IVPB     100 mg 200 mL/hr over 30 Minutes 08/15/19 0959   07/22/2019 2230  remdesivir 200 mg in sodium chloride 0.9% 250 mL IVPB     200 mg 580 mL/hr over 30 Minutes 07/14/2019 2251   08/11/2019 1745  cefTRIAXone (ROCEPHIN) 1 g in sodium chloride 0.9 % 100 mL IVPB     1 g 200 mL/hr over 30 Minutes 08/01/2019 1945   07/22/2019 1745  azithromycin (ZITHROMAX) 500 mg in sodium chloride 0.9 % 250 mL IVPB     500 mg 250 mL/hr over 60 Minutes 07/27/2019 2057       Devices    LINES / TUBES:      Continuous Infusions: . sodium chloride Stopped (08/03/2019 2259)  . fluconazole (DIFLUCAN) IV       Objective: Vitals:   08/24/19 0400 08/24/19 0718 08/24/19 0743 08/24/19 1137  BP: (!) 141/74 (!) 151/76 (!) 151/76 128/66  Pulse: 73 78  87  Resp: 15 13  19   Temp: 97.6 F (36.4 C) (!) 97.4 F (36.3 C)  (!) 97.3 F (36.3 C)  TempSrc: Axillary Axillary  Axillary  SpO2: 97% 95%  94%  Weight: 62.7 kg     Height:        Intake/Output Summary (Last 24 hours) at 08/24/2019 1215 Last data filed at 08/24/2019 1049 Gross per 24 hour  Intake 600 ml  Output 750 ml   Net -150 ml   Filed Weights   08/12/2019 1711 08/24/19 0400  Weight: 63.5 kg 62.7 kg   Physical Exam:  General: A/O x4, positive acute respiratory distress Eyes: negative scleral hemorrhage, negative anisocoria, negative icterus ENT: Negative Runny nose, negative gingival bleeding, Neck:  Negative scars, masses, torticollis, lymphadenopathy, JVD Lungs: Decreased breath sounds bilaterally without wheezes or crackles Cardiovascular: Regular rate and rhythm without murmur gallop or rub normal S1 and S2 Abdomen: negative abdominal pain, nondistended, positive soft, bowel sounds, no rebound, no ascites, no appreciable mass Extremities: No significant cyanosis, clubbing, or edema bilateral lower extremities Skin: Negative rashes, lesions, ulcers Psychiatric:  Negative depression, negative anxiety, negative fatigue, negative mania  Central nervous system:  Cranial nerves II through XII intact, tongue/uvula midline, all extremities muscle strength 5/5, sensation intact throughout, negative dysarthria, negative expressive aphasia, negative receptive aphasia.     Data Reviewed: Care during the described time interval was provided by me .  I have reviewed this patient's available data, including medical history, events of note, physical examination, and all test results as part of my evaluation.   CBC: Recent Labs  Lab 08/19/19 0040 08/20/19 0100 08/21/19 0430 08/22/19 0441 08/23/19  0708  WBC 14.3* 12.3* 14.5* 14.6* 14.2*  NEUTROABS 12.9* 11.4* 13.5* 13.3* 13.3*  HGB 12.0* 12.2* 12.1* 12.5* 12.2*  HCT 35.7* 36.5* 36.6* 37.5* 35.7*  MCV 96.0 96.8 96.8 97.2 98.3  PLT 192 170 149* 164 122*   Basic Metabolic Panel: Recent Labs  Lab 08/19/19 0040 08/20/19 0100 08/21/19 0430 08/22/19 0441 08/23/19 0708 08/24/19 0436  NA 135 135 136 136 136  --   K 4.4 4.5 4.5 4.5 4.7  --   CL 100 98 101 100 103  --   CO2 27 26 28 27 25   --   GLUCOSE 82 149* 61* 103* 96  --   BUN 33* 26* 32* 40*  24*  --   CREATININE 0.79 0.77 0.80 1.03 0.71  --   CALCIUM 8.5* 8.7* 8.5* 8.7* 8.4*  --   MG 2.2 2.2 2.3 2.2 2.1 2.2  PHOS 3.8 3.7 4.8* 4.5 3.3 3.6   GFR: Estimated Creatinine Clearance: 56.3 mL/min (by C-G formula based on SCr of 0.71 mg/dL). Liver Function Tests: Recent Labs  Lab 08/19/19 0040 08/20/19 0100 08/21/19 0430 08/22/19 0441 08/23/19 0708  AST 21 22 19 19 21   ALT 27 25 22 20 20   ALKPHOS 68 76 66 78 81  BILITOT 0.7 0.8 0.8 1.2 0.9  PROT 4.7* 4.8* 4.7* 5.0* 5.1*  ALBUMIN 2.2* 2.4* 2.3* 2.5* 2.4*   No results for input(s): LIPASE, AMYLASE in the last 168 hours. No results for input(s): AMMONIA in the last 168 hours. Coagulation Profile: No results for input(s): INR, PROTIME in the last 168 hours. Cardiac Enzymes: No results for input(s): CKTOTAL, CKMB, CKMBINDEX, TROPONINI in the last 168 hours. BNP (last 3 results) No results for input(s): PROBNP in the last 8760 hours. HbA1C: No results for input(s): HGBA1C in the last 72 hours. CBG: Recent Labs  Lab 08/23/19 1627 08/23/19 1646 08/23/19 2121 08/24/19 0716 08/24/19 1135  GLUCAP 56* 90 228* 183* 308*   Lipid Profile: No results for input(s): CHOL, HDL, LDLCALC, TRIG, CHOLHDL, LDLDIRECT in the last 72 hours. Thyroid Function Tests: No results for input(s): TSH, T4TOTAL, FREET4, T3FREE, THYROIDAB in the last 72 hours. Anemia Panel: Recent Labs    08/23/19 0708 08/24/19 0436  FERRITIN 431* 353*   Urine analysis:    Component Value Date/Time   COLORURINE YELLOW 06/01/2019 0853   APPEARANCEUR CLEAR 06/01/2019 0853   LABSPEC 1.020 06/01/2019 0853   PHURINE 6.5 06/01/2019 0853   GLUCOSEU 250 (A) 06/01/2019 0853   HGBUR NEGATIVE 06/01/2019 0853   BILIRUBINUR NEGATIVE 06/01/2019 0853   KETONESUR NEGATIVE 06/01/2019 0853   PROTEINUR 100 (A) 12/15/2016 1216   UROBILINOGEN 0.2 06/01/2019 0853   NITRITE NEGATIVE 06/01/2019 0853   LEUKOCYTESUR NEGATIVE 06/01/2019 0853   Sepsis  Labs: @LABRCNTIP (procalcitonin:4,lacticidven:4)  ) Recent Results (from the past 240 hour(s))  Culture, blood (routine x 2)     Status: None (Preliminary result)   Collection Time: 08/22/19 11:08 AM   Specimen: BLOOD  Result Value Ref Range Status   Specimen Description   Final    BLOOD RIGHT ARM Performed at [facility], [address] [place], [place]    Special Requests   Final    BOTTLES DRAWN AEROBIC ONLY Blood Culture adequate volume Performed at [facility], [address], [place], [place]    Culture   Final    NO GROWTH < 24 HOURS Performed at [facility], [address], [place],  [place] [address]    Report Status PENDING  Incomplete  Culture, blood (routine x 2)     Status: None (Preliminary result)   Collection Time: 08/22/19 11:15 AM   Specimen: BLOOD  Result Value Ref Range Status   Specimen Description   Final    BLOOD RIGHT HAND Performed at [facility] [address] [place], [place] [address]    Special Requests   Final    BOTTLES DRAWN AEROBIC ONLY Blood Culture adequate volume Performed at [facility] [address] [place], [place] [address]    Culture   Final    NO GROWTH < 24 HOURS Performed at [facility] [address] [place], [place] [address]    Report Status PENDING  Incomplete  Culture, Urine     Status: None   Collection Time: 08/22/19 10:30 PM   Specimen: Urine, Random  Result Value Ref Range Status   Specimen Description   Final    URINE, RANDOM Performed at [facility] [address], [place], [place] [address]    Special Requests   Final    NONE Performed at [facility], [place] [address] [place], [place] [address]    Culture   Final    NO GROWTH Performed at [facility], [place] [address] [place],  [address]    Report Status 08/23/2019 FINAL  Final          Radiology Studies: No results found.      Scheduled Meds: . amLODipine  5 mg Oral Daily  . vitamin C  500 mg Oral Daily  . aspirin EC  81 mg Oral Daily  . Chlorhexidine Gluconate Cloth  6 each Topical Daily  . docusate sodium  100 mg Oral Daily  . enoxaparin (LOVENOX) injection  40 mg Subcutaneous Q24H  . insulin aspart  0-20 Units Subcutaneous TID WC  . insulin aspart  0-5 Units Subcutaneous QHS  . insulin aspart  12 Units Subcutaneous TID WC  . insulin detemir  10 Units Subcutaneous BID  . Ipratropium-Albuterol  1 puff Inhalation QID  . lisinopril  20 mg Oral Daily  . methylPREDNISolone (SOLU-MEDROL) injection  80 mg Intravenous Q8H  . mupirocin ointment   Nasal BID  . zinc sulfate  220 mg Oral Daily   Continuous Infusions: . sodium chloride Stopped (08/02/2019 2259)  . fluconazole (DIFLUCAN) IV       LOS: 14 days   The patient is critically ill with multiple organ systems failure and requires high complexity decision making for assessment and support, frequent evaluation and titration of therapies, application of advanced monitoring technologies and extensive interpretation of multiple databases. Critical Care Time devoted to patient care services described in this note  Time spent: 40 minutes     Tymarion Everard, Geraldo Docker, MD [facility] Pager [phone]  If 7PM-7AM, please contact night-coverage [email or web address] Password [facility] 08/24/2019, 12:15 PM

## 2019-08-24 NOTE — Progress Notes (Signed)
Pt son updated.

## 2019-08-25 LAB — COMPREHENSIVE METABOLIC PANEL
ALT: 20 U/L (ref 0–44)
AST: 17 U/L (ref 15–41)
Albumin: 2.7 g/dL — ABNORMAL LOW (ref 3.5–5.0)
Alkaline Phosphatase: 62 U/L (ref 38–126)
Anion gap: 10 (ref 5–15)
BUN: 37 mg/dL — ABNORMAL HIGH (ref 8–23)
CO2: 27 mmol/L (ref 22–32)
Calcium: 8.2 mg/dL — ABNORMAL LOW (ref 8.9–10.3)
Chloride: 99 mmol/L (ref 98–111)
Creatinine, Ser: 0.93 mg/dL (ref 0.61–1.24)
GFR calc Af Amer: >60 mL/min (ref >60)
GFR calc non Af Amer: >60 mL/min (ref >60)
Glucose, Bld: 235 mg/dL — ABNORMAL HIGH (ref 70–99)
Potassium: 3.8 mmol/L (ref 3.5–5.1)
Sodium: 136 mmol/L (ref 135–145)
Total Bilirubin: 0.8 mg/dL (ref 0.3–1.2)
Total Protein: 4.8 g/dL — ABNORMAL LOW (ref 6.5–8.1)

## 2019-08-25 LAB — CBC WITH DIFFERENTIAL/PLATELET
Abs Immature Granulocytes: 0.09 10*3/uL — ABNORMAL HIGH (ref 0.00–0.07)
Basophils Absolute: 0 10*3/uL (ref 0.0–0.1)
Basophils Relative: 0 %
Eosinophils Absolute: 0 10*3/uL (ref 0.0–0.5)
Eosinophils Relative: 0 %
HCT: 30.7 % — ABNORMAL LOW (ref 39.0–52.0)
Hemoglobin: 10.5 g/dL — ABNORMAL LOW (ref 13.0–17.0)
Immature Granulocytes: 1 %
Lymphocytes Relative: 2 %
Lymphs Abs: 0.3 10*3/uL — ABNORMAL LOW (ref 0.7–4.0)
MCH: 33.5 pg (ref 26.0–34.0)
MCHC: 34.2 g/dL (ref 30.0–36.0)
MCV: 98.1 fL (ref 80.0–100.0)
Monocytes Absolute: 0.3 10*3/uL (ref 0.1–1.0)
Monocytes Relative: 2 %
Neutro Abs: 11.6 10*3/uL — ABNORMAL HIGH (ref 1.7–7.7)
Neutrophils Relative %: 95 %
Platelets: 126 10*3/uL — ABNORMAL LOW (ref 150–400)
RBC: 3.13 MIL/uL — ABNORMAL LOW (ref 4.22–5.81)
RDW: 13.6 % (ref 11.5–15.5)
WBC: 12.2 10*3/uL — ABNORMAL HIGH (ref 4.0–10.5)
nRBC: 0 % (ref 0.0–0.2)

## 2019-08-25 LAB — GLUCOSE, CAPILLARY
Glucose-Capillary: 190 mg/dL — ABNORMAL HIGH (ref 70–99)
Glucose-Capillary: 199 mg/dL — ABNORMAL HIGH (ref 70–99)
Glucose-Capillary: 137 mg/dL — ABNORMAL HIGH (ref 70–99)
Glucose-Capillary: 175 mg/dL — ABNORMAL HIGH (ref 70–99)
Glucose-Capillary: 149 mg/dL — ABNORMAL HIGH (ref 70–99)
Glucose-Capillary: 52 mg/dL — ABNORMAL LOW (ref 70–99)

## 2019-08-25 LAB — C-REACTIVE PROTEIN: CRP: 0.6 mg/dL (ref <1.0)

## 2019-08-25 LAB — D-DIMER, QUANTITATIVE: D-Dimer, Quant: 3.91 ug/mL-FEU — ABNORMAL HIGH (ref 0.00–0.50)

## 2019-08-25 LAB — FERRITIN: Ferritin: 318 ng/mL (ref 24–336)

## 2019-08-25 LAB — MAGNESIUM: Magnesium: 2.1 mg/dL (ref 1.7–2.4)

## 2019-08-25 LAB — PHOSPHORUS: Phosphorus: 3.8 mg/dL (ref 2.5–4.6)

## 2019-08-25 MED ORDER — METHYLPREDNISOLONE SODIUM SUCC 40 MG IJ SOLR
40.0000 mg | Freq: Every day | INTRAMUSCULAR | Status: DC
  Administered 2019-08-26 – 2019-08-31 (×6): 40 mg via INTRAVENOUS
  Filled 2019-08-25 (×6): qty 1

## 2019-08-25 MED ORDER — LISINOPRIL 10 MG PO TABS
10.0000 mg | ORAL_TABLET | Freq: Every day | ORAL | Status: DC
  Administered 2019-08-26 – 2019-08-30 (×4): 10 mg via ORAL
  Filled 2019-08-25 (×5): qty 1

## 2019-08-25 NOTE — Progress Notes (Addendum)
PROGRESS NOTE    Aaron Blair  ZDG:[id number] DOB: 1934/01/27 DOA: 07/19/2019 PCP: No primary care provider on file.    Brief Narrative:  86 year old male who presented with dyspnea.  He does have significant past medical history for type 2 diabetes mellitus, hypertension and dyslipidemia.  Patient was transferred from [facility] for management acute hypoxic failure due to SARS COVID-19 viral pneumonia.  Patient reported 7 days of fevers, chills, poor appetite, dyspnea and lethargy.  On his initial physical examination his oximetry was in the 50s.  Blood pressure 150/89, pulse rate 87, temperature 98.2, respiratory 22, oxygen saturation 93% on supplemental oxygen. His lungs had diffuse rales bilaterally, heart S1-S2 present rhythmic, abdomen soft, no lower extremity edema. SARS COVID-19 positive.  His chest radiograph had bilateral interstitial infiltrates, base of the right upper lobe and left lower lobe.  Patient was admitted to the hospital working diagnosis of acute hypoxic respiratory failure due to SARS COVID-19 viral pneumonia.  Patient with persistent hypoxic respiratory failure, he has been treated with steroids, remdesivir, Actemra x2 and IV furosemide.  CT chest was negative for pulmonary embolism.  Assessment & Plan:   Principal Problem:   Acute hypoxemic respiratory failure due to severe acute respiratory syndrome coronavirus 2 (SARS-CoV-2) disease (HCC) Active Problems:   Chronic diastolic CHF (congestive heart failure) (HCC)   Hypertension   Hyperlipidemia   Demand ischemia ([place])   Pneumonia due to COVID-19 virus   Uncontrolled type 2 diabetes mellitus with complication (HCC)   Elevated troponin   Essential hypertension   1.  Acute hypoxic respiratory failure due to SARS COVID-19 viral pneumonia.  RR: 17  Pulse oxymetry: 98%  Fi02: 15 L/min NRBM  COVID-19 Labs  Recent Labs    08/23/19 0708 08/24/19 0436 08/25/19 0425  DDIMER 11.94* 8.09* 3.91*  FERRITIN 431* 353*  318  CRP 1.0* 0.6 0.6    No results found for: SARSCOV2NAA  Inflammatory markers trending down. CT chest negative for PE.   Patient has completed medical therapy with remdesivir. Had 2 doses of Tocilizumab 12/30 and 01/7. Continue to have high oxygen requirements. Last chest film with persistent bilateral interstitial infiltrates. Sp diuresis for non cardiogenic pulmonary edema.   Will resume systemic corticosteroids 40 mg methylprednisolone for now. Suspected significant lung injury due to COVID 19.   Continue bronchodilator therapy and airway clearing techniques with flutter valve and incentive spirometer.   Patient continues to be at high risk for worsening respiratory failure.   2. COPD. With acute exacerbation, will continue steroids and bronchodilators.   3. Chronic diastolic CHF. Will continue blood pressure monitoring.  4. Hypokalemia. Has been corrected.   DVT prophylaxis: enoxaparin high dose   Code Status:  dnr  Family Communication:  No family at the bedside  Disposition Plan/ discharge barriers: pending improvement in oxygenation.      Subjective: Limited history due to language barrier, no signs of distress, but continue to have high oxygen requirements, no nausea or vomiting, no apparent pain.   Objective: Vitals:   08/25/19 0400 08/25/19 0500 08/25/19 0600 08/25/19 0800  BP: 133/72   135/74  Pulse: 79 88 73 80  Resp: 14 17 14 17   Temp: (!) 97 F (36.1 C)   97.8 F (36.6 C)  TempSrc: Axillary   Axillary  SpO2: 99% 99% 99% 98%  Weight:      Height:        Intake/Output Summary (Last 24 hours) at 08/25/2019 1003 Last data filed at 08/25/2019 0400  Gross per 24 hour  Intake 770 ml  Output 1580 ml  Net -810 ml   Filed Weights   07/18/2019 1711 08/24/19 0400  Weight: 63.5 kg 62.7 kg    Examination:   General: no dyspnea. deconditioned  Neurology: Awake and alert, non focal  E ENT: mild pallor, no icterus, oral mucosa moist Cardiovascular: No JVD.  S1-S2 present, rhythmic, no gallops, rubs, or murmurs. No lower extremity edema. Pulmonary: positive breath sounds bilaterally. Gastrointestinal. Abdomen with no organomegaly, non tender, no rebound or guarding Skin. No rashes Musculoskeletal: no joint deformities     Data Reviewed: I have personally reviewed following labs and imaging studies  CBC: Recent Labs  Lab 08/21/19 0430 08/22/19 0441 08/23/19 0708 08/24/19 0315 08/25/19 0425  WBC 14.5* 14.6* 14.2* 15.6* 12.2*  NEUTROABS 13.5* 13.3* 13.3* 14.8* 11.6*  HGB 12.1* 12.5* 12.2* 12.7* 10.5*  HCT 36.6* 37.5* 35.7* 36.6* 30.7*  MCV 96.8 97.2 98.3 97.3 98.1  PLT 149* 164 122* 152 126*   Basic Metabolic Panel: Recent Labs  Lab 08/21/19 0430 08/22/19 0441 08/23/19 0708 08/24/19 0315 08/24/19 0436 08/25/19 0425  NA 136 136 136 137  --  136  K 4.5 4.5 4.7 4.2  --  3.8  CL 101 100 103 102  --  99  CO2 28 27 25 26   --  27  GLUCOSE 61* 103* 96 136*  --  235*  BUN 32* 40* 24* 32*  --  37*  CREATININE 0.80 1.03 0.71 0.90  --  0.93  CALCIUM 8.5* 8.7* 8.4* 8.8*  --  8.2*  MG 2.3 2.2 2.1  --  2.2 2.1  PHOS 4.8* 4.5 3.3  --  3.6 3.8   GFR: Estimated Creatinine Clearance: 48.4 mL/min (by C-G formula based on SCr of 0.93 mg/dL). Liver Function Tests: Recent Labs  Lab 08/21/19 0430 08/22/19 0441 08/23/19 0708 08/24/19 0315 08/25/19 0425  AST 19 19 21 20 17   ALT 22 20 20 24 20   ALKPHOS 66 78 81 77 62  BILITOT 0.8 1.2 0.9 1.0 0.8  PROT 4.7* 5.0* 5.1* 5.3* 4.8*  ALBUMIN 2.3* 2.5* 2.4* 2.5* 2.7*   No results for input(s): LIPASE, AMYLASE in the last 168 hours. No results for input(s): AMMONIA in the last 168 hours. Coagulation Profile: No results for input(s): INR, PROTIME in the last 168 hours. Cardiac Enzymes: No results for input(s): CKTOTAL, CKMB, CKMBINDEX, TROPONINI in the last 168 hours. BNP (last 3 results) No results for input(s): PROBNP in the last 8760 hours. HbA1C: No results for input(s): HGBA1C in the  last 72 hours. CBG: Recent Labs  Lab 08/23/19 2121 08/24/19 0716 08/24/19 1135 08/24/19 1655 08/24/19 2109  GLUCAP 228* 183* 308* 138* 104*   Lipid Profile: No results for input(s): CHOL, HDL, LDLCALC, TRIG, CHOLHDL, LDLDIRECT in the last 72 hours. Thyroid Function Tests: No results for input(s): TSH, T4TOTAL, FREET4, T3FREE, THYROIDAB in the last 72 hours. Anemia Panel: Recent Labs    08/24/19 0436 08/25/19 0425  FERRITIN 353* 318      Radiology Studies: I have reviewed all of the imaging during this hospital visit personally     Scheduled Meds: . amLODipine  5 mg Oral Daily  . vitamin C  500 mg Oral Daily  . aspirin EC  81 mg Oral Daily  . Chlorhexidine Gluconate Cloth  6 each Topical Daily  . docusate sodium  100 mg Oral Daily  . enoxaparin (LOVENOX) injection  40 mg Subcutaneous Q12H  . insulin  aspart  0-20 Units Subcutaneous TID WC  . insulin aspart  0-5 Units Subcutaneous QHS  . insulin aspart  12 Units Subcutaneous TID WC  . insulin detemir  10 Units Subcutaneous BID  . Ipratropium-Albuterol  1 puff Inhalation QID  . lisinopril  20 mg Oral Daily  . methylPREDNISolone (SOLU-MEDROL) injection  80 mg Intravenous Q8H  . mupirocin ointment   Nasal BID  . zinc sulfate  220 mg Oral Daily   Continuous Infusions: . sodium chloride Stopped (08/06/2019 2259)  . fluconazole (DIFLUCAN) IV Stopped (08/24/19 2137)     LOS: 15 days        Aaron Malcolm Annett Gula, MD

## 2019-08-26 ENCOUNTER — Inpatient Hospital Stay (HOSPITAL_COMMUNITY): Payer: Medicare Other

## 2019-08-26 LAB — FERRITIN: Ferritin: 310 ng/mL (ref 24–336)

## 2019-08-26 LAB — COMPREHENSIVE METABOLIC PANEL
ALT: 20 U/L (ref 0–44)
AST: 18 U/L (ref 15–41)
Albumin: 2.5 g/dL — ABNORMAL LOW (ref 3.5–5.0)
Alkaline Phosphatase: 52 U/L (ref 38–126)
Anion gap: 7 (ref 5–15)
BUN: 39 mg/dL — ABNORMAL HIGH (ref 8–23)
CO2: 29 mmol/L (ref 22–32)
Calcium: 8.2 mg/dL — ABNORMAL LOW (ref 8.9–10.3)
Chloride: 100 mmol/L (ref 98–111)
Creatinine, Ser: 0.81 mg/dL (ref 0.61–1.24)
GFR calc Af Amer: >60 mL/min (ref >60)
GFR calc non Af Amer: >60 mL/min (ref >60)
Glucose, Bld: 137 mg/dL — ABNORMAL HIGH (ref 70–99)
Potassium: 3.8 mmol/L (ref 3.5–5.1)
Sodium: 136 mmol/L (ref 135–145)
Total Bilirubin: 0.8 mg/dL (ref 0.3–1.2)
Total Protein: 4.6 g/dL — ABNORMAL LOW (ref 6.5–8.1)

## 2019-08-26 LAB — D-DIMER, QUANTITATIVE: D-Dimer, Quant: 3.33 ug/mL-FEU — ABNORMAL HIGH (ref 0.00–0.50)

## 2019-08-26 LAB — GLUCOSE, CAPILLARY
Glucose-Capillary: 217 mg/dL — ABNORMAL HIGH (ref 70–99)
Glucose-Capillary: 311 mg/dL — ABNORMAL HIGH (ref 70–99)
Glucose-Capillary: 187 mg/dL — ABNORMAL HIGH (ref 70–99)
Glucose-Capillary: 173 mg/dL — ABNORMAL HIGH (ref 70–99)

## 2019-08-26 LAB — C-REACTIVE PROTEIN: CRP: 0.5 mg/dL (ref <1.0)

## 2019-08-26 MED ORDER — MOMETASONE FURO-FORMOTEROL FUM 200-5 MCG/ACT IN AERO
2.0000 | INHALATION_SPRAY | Freq: Two times a day (BID) | RESPIRATORY_TRACT | Status: DC
  Administered 2019-08-26 – 2019-09-03 (×16): 2 via RESPIRATORY_TRACT
  Filled 2019-08-26 (×2): qty 8.8

## 2019-08-26 MED ORDER — FLUCONAZOLE 100 MG PO TABS
100.0000 mg | ORAL_TABLET | Freq: Every day | ORAL | Status: AC
  Administered 2019-08-26 – 2019-08-29 (×4): 100 mg via ORAL
  Filled 2019-08-26 (×4): qty 1

## 2019-08-26 MED ORDER — IPRATROPIUM-ALBUTEROL 20-100 MCG/ACT IN AERS
2.0000 | INHALATION_SPRAY | Freq: Four times a day (QID) | RESPIRATORY_TRACT | Status: DC
  Administered 2019-08-26 – 2019-09-03 (×31): 2 via RESPIRATORY_TRACT
  Filled 2019-08-26 (×2): qty 4

## 2019-08-26 NOTE — Progress Notes (Signed)
Pt son Emelda Fear updated by phone

## 2019-08-26 NOTE — Progress Notes (Addendum)
PROGRESS NOTE    JAISHAWN Blair  BOF:[id number] DOB: [birth year] DOA: 09-09-19 PCP: No primary care provider on file.    Brief Narrative:  86 year old male who presented with dyspnea.  He does have significant past medical history for type 2 diabetes mellitus, hypertension and dyslipidemia.  Patient was transferred from [facility] for management acute hypoxic failure due to SARS COVID-19 viral pneumonia.  Patient reported 7 days of fevers, chills, poor appetite, dyspnea and lethargy.  On his initial physical examination his oximetry was in the 50s.  Blood pressure 150/89, pulse rate 87, temperature 98.2, respiratory 22, oxygen saturation 93% on supplemental oxygen. His lungs had diffuse rales bilaterally, heart S1-S2 present rhythmic, abdomen soft, no lower extremity edema. SARS COVID-19 positive.  His chest radiograph had bilateral interstitial infiltrates, base of the right upper lobe and left lower lobe.  Patient was admitted to the hospital working diagnosis of acute hypoxic respiratory failure due to SARS COVID-19 viral pneumonia.  Patient with persistent hypoxic respiratory failure, he has been treated with steroids, remdesivir, Actemra x2 and IV furosemide.  CT chest was negative for pulmonary embolism.   Assessment & Plan:   Principal Problem:   Acute hypoxemic respiratory failure due to severe acute respiratory syndrome coronavirus 2 (SARS-CoV-2) disease (HCC) Active Problems:   Chronic diastolic CHF (congestive heart failure) (HCC)   Hypertension   Hyperlipidemia   Demand ischemia ([place])   Pneumonia due to COVID-19 virus   Uncontrolled type 2 diabetes mellitus with complication (HCC)   Elevated troponin   Essential hypertension   1.  Acute hypoxic respiratory failure due to SARS COVID-19 viral pneumonia/ significant lung injury due to SARS COVID 19. Sp Remdesivir #5/5. Tocilizumab 12/30 and 01/7.   RR: 15  Pulse oxymetry: 100%  Fi02: 15 L/ min per HFNC and NRBM  COVID-19  Labs  Recent Labs    08/24/19 0436 08/25/19 0425 08/26/19 0206  DDIMER 8.09* 3.91* 3.33*  FERRITIN 353* 318 310  CRP 0.6 0.6 0.5    No results found for: SARSCOV2NAA   Inflammatory markers continue to trend down.   Today's chest film personally reviewed noted improvement on bilateral infiltrates, will continue to titrate down supplemental 02 to target 02 saturation greater than 88%.   Continue with systemic corticosteroids 40 mg methylprednisolone IV.  Suspected significant lung injury due to COVID 19.   On  bronchodilator therapy and airway clearing techniques with flutter valve and incentive spirometer.   Continue at high risk for worsening respiratory failure.   2. COPD. On steroids and bronchodilators. Will add inhaled steroids. Target oxygen saturation more than 88%.   3. Acute on chronic diastolic CHF/ HTN.  Patient had diuresis for volume overload, clinically today more euvolemic, will continue close blood pressure monitoring, hold on diuresis today. Continue amlodipine and lisinopril.    4. Hypokalemia. Has been corrected. Serum K today at 3,8 with preserved renal function serum cr at 0.81.   5. Uncontrolled T2DM with steroid induced hyperglycemia. Fasting glucose 137, will continue insulin sliding scale for glucose cover and monitoring. Basal insulin and pre-meal coverage. Patient is tolerating po well. Will consult nutrition.   DVT prophylaxis: enoxaparin high dose   Code Status:  dnr  Family Communication:  I spoke over the phone with the patient's son about patient's  condition, plan of care and all questions were addressed.  Disposition Plan/ discharge barriers: pending improvement in oxygenation.    Subjective: Patient continue to have persistent dyspnea, mild improvement but still symptomatic, no  nausea or vomiting, no chest pain. Has been not out of bed yet.   Objective: Vitals:   08/26/19 0044 08/26/19 0337 08/26/19 0500 08/26/19 0740  BP: 134/66  128/76  139/75  Pulse: 74 74  79  Resp: 15 16  15   Temp: 98 F (36.7 C) 98.2 F (36.8 C)  (!) 97.1 F (36.2 C)  TempSrc: Axillary Axillary  Axillary  SpO2: 98% 99%  100%  Weight:   62.5 kg   Height:        Intake/Output Summary (Last 24 hours) at 08/26/2019 0856 Last data filed at 08/25/2019 2100 Gross per 24 hour  Intake 403.21 ml  Output --  Net 403.21 ml   Filed Weights   2019/09/02 1711 08/24/19 0400 08/26/19 0500  Weight: 63.5 kg 62.7 kg 62.5 kg    Examination:   General: Not in pain, positive mild dyspnea at rest.  Neurology: Awake and alert, non focal  E ENT: mild pallor, no icterus, oral mucosa moist Cardiovascular: No JVD. S1-S2 present, rhythmic, no gallops, rubs, or murmurs. No lower extremity edema. Pulmonary: positive breath sounds bilaterally, decreased air movement. Gastrointestinal. Abdomen with no organomegaly, non tender, no rebound or guarding Skin. No rashes Musculoskeletal: no joint deformities     Data Reviewed: I have personally reviewed following labs and imaging studies  CBC: Recent Labs  Lab 08/21/19 0430 08/22/19 0441 08/23/19 0708 08/24/19 0315 08/25/19 0425  WBC 14.5* 14.6* 14.2* 15.6* 12.2*  NEUTROABS 13.5* 13.3* 13.3* 14.8* 11.6*  HGB 12.1* 12.5* 12.2* 12.7* 10.5*  HCT 36.6* 37.5* 35.7* 36.6* 30.7*  MCV 96.8 97.2 98.3 97.3 98.1  PLT 149* 164 122* 152 126*   Basic Metabolic Panel: Recent Labs  Lab 08/21/19 0430 08/21/19 0430 08/22/19 0441 08/23/19 0708 08/24/19 0315 08/24/19 0436 08/25/19 0425 08/26/19 0206  NA 136   < > 136 136 137  --  136 136  K 4.5   < > 4.5 4.7 4.2  --  3.8 3.8  CL 101   < > 100 103 102  --  99 100  CO2 28   < > 27 25 26   --  27 29  GLUCOSE 61*   < > 103* 96 136*  --  235* 137*  BUN 32*   < > 40* 24* 32*  --  37* 39*  CREATININE 0.80   < > 1.03 0.71 0.90  --  0.93 0.81  CALCIUM 8.5*   < > 8.7* 8.4* 8.8*  --  8.2* 8.2*  MG 2.3  --  2.2 2.1  --  2.2 2.1  --   PHOS 4.8*  --  4.5 3.3  --  3.6 3.8   --    < > = values in this interval not displayed.   GFR: Estimated Creatinine Clearance: 55.6 mL/min (by C-G formula based on SCr of 0.81 mg/dL). Liver Function Tests: Recent Labs  Lab 08/22/19 0441 08/23/19 0708 08/24/19 0315 08/25/19 0425 08/26/19 0206  AST 19 21 20 17 18   ALT 20 20 24 20 20   ALKPHOS 78 81 77 62 52  BILITOT 1.2 0.9 1.0 0.8 0.8  PROT 5.0* 5.1* 5.3* 4.8* 4.6*  ALBUMIN 2.5* 2.4* 2.5* 2.7* 2.5*   No results for input(s): LIPASE, AMYLASE in the last 168 hours. No results for input(s): AMMONIA in the last 168 hours. Coagulation Profile: No results for input(s): INR, PROTIME in the last 168 hours. Cardiac Enzymes: No results for input(s): CKTOTAL, CKMB, CKMBINDEX, TROPONINI in the last 168  hours. BNP (last 3 results) No results for input(s): PROBNP in the last 8760 hours. HbA1C: No results for input(s): HGBA1C in the last 72 hours. CBG: Recent Labs  Lab 08/25/19 1620 08/25/19 1919 08/25/19 2146 08/25/19 2224 08/26/19 0712  GLUCAP 137* 175* 52* 149* 217*   Lipid Profile: No results for input(s): CHOL, HDL, LDLCALC, TRIG, CHOLHDL, LDLDIRECT in the last 72 hours. Thyroid Function Tests: No results for input(s): TSH, T4TOTAL, FREET4, T3FREE, THYROIDAB in the last 72 hours. Anemia Panel: Recent Labs    08/25/19 0425 08/26/19 0206  FERRITIN 318 310      Radiology Studies: I have reviewed all of the imaging during this hospital visit personally     Scheduled Meds: . amLODipine  5 mg Oral Daily  . vitamin C  500 mg Oral Daily  . aspirin EC  81 mg Oral Daily  . Chlorhexidine Gluconate Cloth  6 each Topical Daily  . docusate sodium  100 mg Oral Daily  . enoxaparin (LOVENOX) injection  40 mg Subcutaneous Q12H  . insulin aspart  0-20 Units Subcutaneous TID WC  . insulin aspart  0-5 Units Subcutaneous QHS  . insulin aspart  12 Units Subcutaneous TID WC  . insulin detemir  10 Units Subcutaneous BID  . Ipratropium-Albuterol  1 puff Inhalation QID    . lisinopril  10 mg Oral Daily  . methylPREDNISolone (SOLU-MEDROL) injection  40 mg Intravenous Daily  . mupirocin ointment   Nasal BID  . zinc sulfate  220 mg Oral Daily   Continuous Infusions: . sodium chloride Stopped (08/20/2019 2259)  . fluconazole (DIFLUCAN) IV Stopped (08/25/19 2140)     LOS: 16 days        Merdith Adan Annett Gula, MD

## 2019-08-26 NOTE — Progress Notes (Addendum)
Attempted to wean pt off 15L NRB mask with MD in the room, unable to wean w/o pt O2 sat's dropping to low 80's. RN able to wean pt down to 15L HF with 10L NRB mask.

## 2019-08-26 NOTE — TOC Progression Note (Signed)
Transition of Care [facility]) - Progression Note    Patient Details  Name: Aaron Blair MRN: [id number] Date of Birth: 03-Sep-1933  Transition of Care [facility] CM/SW Contact  Elliot Gault, LCSW Phone Number: 08/26/2019, 1:01 PM  Clinical Narrative:     [facility] consult acknowledged. Discussed with TOC supervisor, Steward Drone. There is no acute level of care available where family visitation would be possible. TOC will follow for dc planning needs as pt's stay progresses.  Expected Discharge Plan: Home w Home Health Services Barriers to Discharge: Continued Medical Work up  Expected Discharge Plan and Services Expected Discharge Plan: Home w Home Health Services                                               Social Determinants of Health (SDOH) Interventions    Readmission Risk Interventions No flowsheet data found.

## 2019-08-26 NOTE — Progress Notes (Signed)
PMT consult received and chart reviewed. Discussed with Dr. Ella Jubilee who has discussed goals of care with son. Plan is to continue current plan of care and if patient is still hospitalized by day 21, will likely need rehab. No current palliative needs per attending. Please contact PMT if further needs arise. Thank you.   NO CHARGE  Vennie Homans, DNP, FNP-C Palliative Medicine Team  Phone: [phone] Fax: [phone]

## 2019-08-26 NOTE — Evaluation (Signed)
Physical Therapy Evaluation Patient Details Name: Aaron Blair MRN: [id number] DOB: 15-Apr-1934 Today's Date: 08/26/2019   History of Present Illness  86 year old male who presented with dyspnea.  He does have significant past medical history for type 2 diabetes mellitus, hypertension and dyslipidemia.  Patient was transferred from [facility] for management acute hypoxic failure due to SARS COVID-19 viral pneumonia.  Patient reported 7 days of fevers, chills, poor appetite, dyspnea and lethargy  Clinical Impression  The patient is significantly compromised. Patient on 15 L HFNC and 10 L NRB, at rest SPO2 94%. HR 100, RR 35. Assisted patient to [facility] then to bed with SPO2 dropping into 60's. Placed on 15 NRB and 15 HFNC to recover back to 95% , in ~ 4 minutes. RN aware of need to increase O2 and will titrate back down as tolerated. Per RN, attempting to wean . Patient lives with family. Unable to get info from Patient as he was too SOB. Will follow up  With family when patient is weaning. Pt admitted with above diagnosis.  Pt currently with functional limitations due to the deficits listed below (see PT Problem List). Pt will benefit from skilled PT to increase their independence and safety with mobility to allow discharge to the venue listed below.       Follow Up Recommendations Home health PT;Supervision/Assistance - 24 hour    Equipment Recommendations  (TBD)    Recommendations for Other Services       Precautions / Restrictions Precautions Precaution Comments: desats, on 15 of HFNC and NRB      Mobility  Bed Mobility Overal bed mobility: Needs Assistance Bed Mobility: Sit to Supine       Sit to supine: Supervision   General bed mobility comments: patient self assisted  back into bed  Transfers Overall transfer level: Needs assistance Equipment used: 1 person hand held assist Transfers: Sit to/from [employer] Sit to Stand: Min assist Stand pivot transfers: Min  assist       General transfer comment: patient indicated that he needed to get to [facility], transfer to [facility] then to bed.  Ambulation/Gait                Stairs            Wheelchair Mobility    Modified Rankin (Stroke Patients Only)       Balance Overall balance assessment: Needs assistance Sitting-balance support: Feet supported;Bilateral upper extremity supported Sitting balance-Leahy Scale: Fair     Standing balance support: During functional activity;Single extremity supported Standing balance-Leahy Scale: Poor                               Pertinent Vitals/Pain      Home Living Family/patient expects to be discharged to:: Private residence Living Arrangements: Spouse/significant other;Children Available Help at Discharge: Family             Additional Comments: patient was in too much distress to ask via interpreter.    Prior Function                 Hand Dominance        Extremity/Trunk Assessment   Upper Extremity Assessment Upper Extremity Assessment: Defer to OT evaluation    Lower Extremity Assessment Lower Extremity Assessment: Generalized weakness    Cervical / Trunk Assessment Cervical / Trunk Assessment: Normal  Communication      Cognition Arousal/Alertness: Awake/alert Behavior During  Therapy: Restless;Anxious(hypoxia with mminimal activity) Overall Cognitive Status: Difficult to assess                                 General Comments: communicated with helen, interpreter briefly and appropriate      General Comments      Exercises     Assessment/Plan    PT Assessment Patient needs continued PT services  PT Problem List Decreased strength;Decreased mobility;Decreased safety awareness;Decreased activity tolerance;Cardiopulmonary status limiting activity;Decreased knowledge of use of DME       PT Treatment Interventions DME instruction;Therapeutic activities;Gait  training;Therapeutic exercise;Patient/family education;Functional mobility training    PT Goals (Current goals can be found in the Care Plan section)  Acute Rehab PT Goals Patient Stated Goal: agreed with progressive activity PT Goal Formulation: With patient Time For Goal Achievement: 09/09/19 Potential to Achieve Goals: Fair    Frequency Min 3X/week   Barriers to discharge        Co-evaluation               AM-PAC PT "6 Clicks" Mobility  Outcome Measure Help needed turning from your back to your side while in a flat bed without using bedrails?: A Little Help needed moving from lying on your back to sitting on the side of a flat bed without using bedrails?: A Little Help needed moving to and from a bed to a chair (including a wheelchair)?: A Lot Help needed standing up from a chair using your arms (e.g., wheelchair or bedside chair)?: A Lot Help needed to walk in hospital room?: Total Help needed climbing 3-5 steps with a railing? : Total 6 Click Score: 12    End of Session Equipment Utilized During Treatment: Oxygen Activity Tolerance: Treatment limited secondary to medical complications (Comment) Patient left: in bed;with call bell/phone within reach;with bed alarm set Nurse Communication: Mobility status PT Visit Diagnosis: Difficulty in walking, not elsewhere classified (R26.2)    Time: 8119-1478 PT Time Calculation (min) (ACUTE ONLY): 28 min   Charges:   PT Evaluation $PT Eval Moderate Complexity: 1 Mod PT Treatments $Therapeutic Activity: 8-22 mins        [place] Pager [phone] Office [phone]   Claretha Cooper 08/26/2019, 4:23 PM

## 2019-08-26 NOTE — Progress Notes (Signed)
PHARMACIST - PHYSICIAN COMMUNICATION DR:   Ella Jubilee CONCERNING: Antibiotic IV to Oral Route Change Policy  RECOMMENDATION: This patient is receiving fluconazole by the intravenous route.  Based on criteria approved by the Pharmacy and Therapeutics Committee, the antibiotic(s) is/are being converted to the equivalent oral dose form(s).   DESCRIPTION: These criteria include:  Patient being treated for a respiratory tract infection, urinary tract infection, cellulitis or clostridium difficile associated diarrhea if on metronidazole  The patient is not neutropenic and does not exhibit a GI malabsorption state  The patient is eating (either orally or via tube) and/or has been taking other orally administered medications for a least 24 hours  The patient is improving clinically and has a Tmax < 100.5  If you have questions about this conversion, please contact the Pharmacy Department   Loralee Pacas, PharmD, BCPS Pharmacy: [phone] 08/26/2019 12:55 PM

## 2019-08-27 LAB — COMPREHENSIVE METABOLIC PANEL
ALT: 30 U/L (ref 0–44)
AST: 25 U/L (ref 15–41)
Albumin: 2.5 g/dL — ABNORMAL LOW (ref 3.5–5.0)
Alkaline Phosphatase: 52 U/L (ref 38–126)
Anion gap: 8 (ref 5–15)
BUN: 35 mg/dL — ABNORMAL HIGH (ref 8–23)
CO2: 29 mmol/L (ref 22–32)
Calcium: 8.3 mg/dL — ABNORMAL LOW (ref 8.9–10.3)
Chloride: 101 mmol/L (ref 98–111)
Creatinine, Ser: 0.72 mg/dL (ref 0.61–1.24)
GFR calc Af Amer: >60 mL/min (ref >60)
GFR calc non Af Amer: >60 mL/min (ref >60)
Glucose, Bld: 100 mg/dL — ABNORMAL HIGH (ref 70–99)
Potassium: 4.1 mmol/L (ref 3.5–5.1)
Sodium: 138 mmol/L (ref 135–145)
Total Bilirubin: 0.8 mg/dL (ref 0.3–1.2)
Total Protein: 4.8 g/dL — ABNORMAL LOW (ref 6.5–8.1)

## 2019-08-27 LAB — D-DIMER, QUANTITATIVE: D-Dimer, Quant: 3.08 ug/mL-FEU — ABNORMAL HIGH (ref 0.00–0.50)

## 2019-08-27 LAB — C-REACTIVE PROTEIN: CRP: <0.5 mg/dL (ref <1.0)

## 2019-08-27 LAB — GLUCOSE, CAPILLARY
Glucose-Capillary: 103 mg/dL — ABNORMAL HIGH (ref 70–99)
Glucose-Capillary: 161 mg/dL — ABNORMAL HIGH (ref 70–99)
Glucose-Capillary: 178 mg/dL — ABNORMAL HIGH (ref 70–99)
Glucose-Capillary: 190 mg/dL — ABNORMAL HIGH (ref 70–99)

## 2019-08-27 LAB — FERRITIN: Ferritin: 387 ng/mL — ABNORMAL HIGH (ref 24–336)

## 2019-08-27 MED ORDER — HYDROCOD POLST-CPM POLST ER 10-8 MG/5ML PO SUER
5.0000 mL | Freq: Two times a day (BID) | ORAL | Status: DC
  Administered 2019-08-27 – 2019-09-04 (×17): 5 mL via ORAL
  Filled 2019-08-27 (×17): qty 5

## 2019-08-27 NOTE — Progress Notes (Signed)
PROGRESS NOTE    KAI CALICO  ZRA:[id number] DOB: March 07, 1934 DOA: 07/27/2019 PCP: No primary care provider on file.    Brief Narrative:  86 year old male who presented with dyspnea. He does have significant past medical history for type 2 diabetes mellitus, hypertension and dyslipidemia. Patient was transferred from [facility] for management acute hypoxic failure due to SARS COVID-19 viral pneumonia. Patient reported 7 days of fevers, chills, poor appetite, dyspnea and lethargy. On his initial physical examination his oximetry was in the 50s. Blood pressure 150/89, pulse rate 87, temperature 98.2, respiratory 22, oxygen saturation 93% on supplemental oxygen. His lungs had diffuse rales bilaterally, heart S1-S2 present rhythmic, abdomen soft, no lower extremity edema. SARS COVID-19 positive.His chest radiograph had bilateral interstitial infiltrates, base of the right upper lobe and left lower lobe.  Patient was admitted to the hospital working diagnosis of acute hypoxic respiratory failure due to SARS COVID-19 viral pneumonia.  Patient with persistent hypoxic respiratory failure, he has been treated with steroids, remdesivir, Actemra x2 and IV furosemide. CT chest was negative for pulmonary embolism.  Very slow to response, has COPD at baseline with emphysematous changes on CT chest. Follow up chest film (01/14) personally reviewed noted improvement on bilateral infiltrates, plan to continue to titrate down supplemental 02 to target 02 saturation greater than 88%.  Assessment & Plan:   Principal Problem:   Acute hypoxemic respiratory failure due to severe acute respiratory syndrome coronavirus 2 (SARS-CoV-2) disease (HCC) Active Problems:   Chronic diastolic CHF (congestive heart failure) (HCC)   Hypertension   Hyperlipidemia   Demand ischemia (HCC)   Pneumonia due to COVID-19 virus   Uncontrolled type 2 diabetes mellitus with complication (HCC)   Elevated troponin   Essential  hypertension   1.Acute hypoxic respiratory failure due to SARS COVID-19 viral pneumonia/ significant lung injury due to SARS COVID 19.  Significant lung injury due to COVID 19.   Sp Remdesivir #5/5. Tocilizumab 12/30 and 01/7.   RR: 23  Pulse oxymetry: 92%  Fi02: 15 L/ min HFNC and NRBM  COVID-19 Labs  Recent Labs    08/25/19 0425 08/26/19 0206 08/27/19 0815  DDIMER 3.91* 3.33* 3.08*  FERRITIN 318 310 387*  CRP 0.6 0.5 <0.5    No results found for: SARSCOV2NAA  Inflammatory markers trending down.    Continue attempts for decrease Fi02 as tolerated to target 02 saturation more than 88%. Continue with a slow taper of steroids, now on 40 mg methylprednisolone IV daily.  Continue with  bronchodilator therapy and airway clearing techniques with flutter valve and incentive spirometer.Out of bed tid to chair as tolerated and follow with physical and occupational therapy.   Patient is at a high risk for worsening respiratory failure.  2. COPD. Continue with steroids and bronchodilators. Tolerating well inhaled steroids,  3. Acute on chronic diastolic CHF/ HTN.  sp diuresis for volume overload, now patient looks euvolemic, will continue blood pressure control with amodipine and lisinopril.    4. Hypokalemia. Stable renal function and electrolytes.    5. Uncontrolled T2DM (Hgb A1c 9,4) with steroid induced hyperglycemia. Fasting glucose this am is 100. On insulin sliding scale for glucose cover and monitoring. Basal insulin and pre-meal coverage. Patient is tolerating po well. Will consult nutrition.   DVT prophylaxis:enoxaparin high dose Code Status:dnr Family Communication:no family at the bedside Disposition Plan/ discharge barriers:pending improvement in oxygenation.   Subjective: Patient continue to have high oxygen requirements and decreased mobility. He reports stable dyspnea, no nausea or vomiting,  no chest pain. Tolerating po well.    Objective: Vitals:   08/27/19 0000 08/27/19 0400 08/27/19 0500 08/27/19 0739  BP:  114/69  120/61  Pulse:  78  95  Resp:  14  (!) 23  Temp: 98.4 F (36.9 C) 97.7 F (36.5 C)    TempSrc: Oral Oral    SpO2:  100%  92%  Weight:   62 kg   Height:        Intake/Output Summary (Last 24 hours) at 08/27/2019 1007 Last data filed at 08/27/2019 0525 Gross per 24 hour  Intake 120 ml  Output 755 ml  Net -635 ml   Filed Weights   08/24/19 0400 08/26/19 0500 08/27/19 0500  Weight: 62.7 kg 62.5 kg 62 kg    Examination:   General: Not in pain, positive dyspnea at rest.  Neurology: Awake and alert, non focal  E ENT: mild pallor, no icterus, oral mucosa moist Cardiovascular: No JVD. S1-S2 present. No lower extremity edema. Pulmonary: positive breath sounds bilaterally. Gastrointestinal. Abdomen with no organomegaly, non tender, no rebound or guarding Skin. No rashes Musculoskeletal: no joint deformities     Data Reviewed: I have personally reviewed following labs and imaging studies  CBC: Recent Labs  Lab 08/21/19 0430 08/22/19 0441 08/23/19 0708 08/24/19 0315 08/25/19 0425  WBC 14.5* 14.6* 14.2* 15.6* 12.2*  NEUTROABS 13.5* 13.3* 13.3* 14.8* 11.6*  HGB 12.1* 12.5* 12.2* 12.7* 10.5*  HCT 36.6* 37.5* 35.7* 36.6* 30.7*  MCV 96.8 97.2 98.3 97.3 98.1  PLT 149* 164 122* 152 474*   Basic Metabolic Panel: Recent Labs  Lab 08/21/19 0430 08/21/19 0430 08/22/19 0441 08/22/19 0441 08/23/19 0708 08/24/19 0315 08/24/19 0436 08/25/19 0425 08/26/19 0206 08/27/19 0815  NA 136   < > 136   < > 136 137  --  136 136 138  K 4.5   < > 4.5   < > 4.7 4.2  --  3.8 3.8 4.1  CL 101   < > 100   < > 103 102  --  99 100 101  CO2 28   < > 27   < > 25 26  --  27 29 29   GLUCOSE 61*   < > 103*   < > 96 136*  --  235* 137* 100*  BUN 32*   < > 40*   < > 24* 32*  --  37* 39* 35*  CREATININE 0.80   < > 1.03   < > 0.71 0.90  --  0.93 0.81 0.72  CALCIUM 8.5*   < > 8.7*   < > 8.4* 8.8*  --  8.2*  8.2* 8.3*  MG 2.3  --  2.2  --  2.1  --  2.2 2.1  --   --   PHOS 4.8*  --  4.5  --  3.3  --  3.6 3.8  --   --    < > = values in this interval not displayed.   GFR: Estimated Creatinine Clearance: 56.3 mL/min (by C-G formula based on SCr of 0.72 mg/dL). Liver Function Tests: Recent Labs  Lab 08/23/19 0708 08/24/19 0315 08/25/19 0425 08/26/19 0206 08/27/19 0815  AST 21 20 17 18 25   ALT 20 24 20 20 30   ALKPHOS 81 77 62 52 52  BILITOT 0.9 1.0 0.8 0.8 0.8  PROT 5.1* 5.3* 4.8* 4.6* 4.8*  ALBUMIN 2.4* 2.5* 2.7* 2.5* 2.5*   No results for input(s): LIPASE, AMYLASE in the last 168 hours.  No results for input(s): AMMONIA in the last 168 hours. Coagulation Profile: No results for input(s): INR, PROTIME in the last 168 hours. Cardiac Enzymes: No results for input(s): CKTOTAL, CKMB, CKMBINDEX, TROPONINI in the last 168 hours. BNP (last 3 results) No results for input(s): PROBNP in the last 8760 hours. HbA1C: No results for input(s): HGBA1C in the last 72 hours. CBG: Recent Labs  Lab 08/26/19 0712 08/26/19 1120 08/26/19 1613 08/26/19 1938 08/27/19 0722  GLUCAP 217* 311* 187* 173* 103*   Lipid Profile: No results for input(s): CHOL, HDL, LDLCALC, TRIG, CHOLHDL, LDLDIRECT in the last 72 hours. Thyroid Function Tests: No results for input(s): TSH, T4TOTAL, FREET4, T3FREE, THYROIDAB in the last 72 hours. Anemia Panel: Recent Labs    08/25/19 0425 08/26/19 0206  FERRITIN 318 310      Radiology Studies: I have reviewed all of the imaging during this hospital visit personally     Scheduled Meds: . amLODipine  5 mg Oral Daily  . vitamin C  500 mg Oral Daily  . aspirin EC  81 mg Oral Daily  . Chlorhexidine Gluconate Cloth  6 each Topical Daily  . docusate sodium  100 mg Oral Daily  . enoxaparin (LOVENOX) injection  40 mg Subcutaneous Q12H  . fluconazole  100 mg Oral QHS  . insulin aspart  0-20 Units Subcutaneous TID WC  . insulin aspart  0-5 Units Subcutaneous QHS  .  insulin aspart  12 Units Subcutaneous TID WC  . insulin detemir  10 Units Subcutaneous BID  . Ipratropium-Albuterol  2 puff Inhalation QID  . lisinopril  10 mg Oral Daily  . methylPREDNISolone (SOLU-MEDROL) injection  40 mg Intravenous Daily  . mometasone-formoterol  2 puff Inhalation BID  . mupirocin ointment   Nasal BID  . zinc sulfate  220 mg Oral Daily   Continuous Infusions: . sodium chloride Stopped (07/15/2019 2259)     LOS: 17 days        Hanan Mcwilliams Annett Gula, MD

## 2019-08-27 NOTE — Evaluation (Signed)
Occupational Therapy Evaluation Patient Details Name: Aaron Blair MRN: [id number] DOB: Jun 24, 1934 Today's Date: 08/27/2019    History of Present Illness 86 year old male who presented with dyspnea.  He does have significant past medical history for type 2 diabetes mellitus, hypertension and dyslipidemia.  Patient was transferred from [facility] for management acute hypoxic failure due to SARS COVID-19 viral pneumonia.  Patient reported 7 days of fevers, chills, poor appetite, dyspnea and lethargy   Clinical Impression   PTA, pt was living with was living his wife and two sons and was independent. Pt currently requiring Min Guard-Min A for ADLs and functional mobility. Pt with poor activity tolerance as seen by fatigue and SpO2 dropping to 77-66% on 15L via HFNC and NRB. Pt also requiring several cues to maintain NRB mask as he takes it off to hear interpreter better. Pt requiring significant time, seated rest break, and 15L via HFNC and NRB to recover back to 80s. Pt would benefit from further acute OT to facilitate safe dc. Recommend dc to  for further OT to optimize safety, independence with ADLs, and return to PLOF.      Follow Up Recommendations  Home health OT;Supervision/Assistance - 24 hour    Equipment Recommendations  3 in 1 bedside commode(Need to confirm home DME)    Recommendations for Other Services PT consult     Precautions / Restrictions Precautions Precautions: Fall Precaution Comments: desats, on 15 of HFNC and NRB Restrictions Weight Bearing Restrictions: No      Mobility Bed Mobility               General bed mobility comments: Pt in recliner upon arrival  Transfers Overall transfer level: Needs assistance Equipment used: 1 person hand held assist;Rolling walker (2 wheeled) Transfers: Sit to/from [employer] Sit to Stand: Min guard;Min assist         General transfer comment: Min Guard A for initatial sit<>stand from recliner nad BSC.  As session progressed, pt requiring Min A for safe descent and balance    Balance Overall balance assessment: Needs assistance Sitting-balance support: Feet supported;Bilateral upper extremity supported Sitting balance-Leahy Scale: Fair     Standing balance support: During functional activity;Single extremity supported Standing balance-Leahy Scale: Poor Standing balance comment: Reliant on UE support                           ADL either performed or assessed with clinical judgement   ADL Overall ADL's : Needs assistance/impaired Eating/Feeding: Set up;Sitting   Grooming: Oral care;Set up;Supervision/safety;Sitting Grooming Details (indicate cue type and reason): Requiring to perform seated due to decreased actvity tolerance with standing; SpO2 dropping to 80s wuickly on 15L via HFNC and NRB Upper Body Bathing: Minimal assistance;Sitting   Lower Body Bathing: Minimal assistance;Sit to/from stand   Upper Body Dressing : Minimal assistance;Sitting   Lower Body Dressing: Minimal assistance;Sit to/from stand Lower Body Dressing Details (indicate cue type and reason): Min A for adjusitng pants and socks as pt fatigued with movement Toilet Transfer: Min guard;Ambulation;RW;BSC(short distance) Statistician Details (indicate cue type and reason): Min Guard A for safety Toileting- Clothing Manipulation and Hygiene: Min guard;Sit to/from stand;Sitting/lateral lean;Moderate assistance Toileting - Clothing Manipulation Details (indicate cue type and reason): Min Guard A for safety with initial clean up. Pt then squating low to clean peri area and requiring Mod A to reutrn to standing     Functional mobility during ADLs: Minimal assistance;Rolling walker;Min guard General  ADL Comments: Pt demonstrating poor activity tolerance with fatigue and SpO2 dropping to 70-60s on 15L via HFNC and NRB     Vision Baseline Vision/History: Wears glasses Patient Visual Report: No change from  baseline       Perception     Praxis      Pertinent Vitals/Pain Pain Assessment: Faces Faces Pain Scale: Hurts a little bit Pain Location: Generalized Pain Descriptors / Indicators: Discomfort;Grimacing Pain Intervention(s): Monitored during session;Repositioned     Hand Dominance Right   Extremity/Trunk Assessment Upper Extremity Assessment Upper Extremity Assessment: Generalized weakness   Lower Extremity Assessment Lower Extremity Assessment: Defer to PT evaluation   Cervical / Trunk Assessment Cervical / Trunk Assessment: Normal   Communication Communication Communication: HOH;Prefers language other than English(Korean interpreter used)   Cognition Arousal/Alertness: Awake/alert Behavior During Therapy: WFL for tasks assessed/performed;Anxious Overall Cognitive Status: Difficult to assess                                 General Comments: Pt followign commands and able to either verblize his needs or use gestures. Feel he is close to baseline. But difficult to assess wiht language barrier and distress with Spo2 levels.   General Comments  SpO2 dropping to 70s during Oral care with 15L via HFNC. Placing back on 15L via HFNC and NRB. Pt recovering to 88%. Pt then requiring to have BM and SpO2 dropping to 70-60s. Pt requiring seated rest break in recliner with BLEs elevated and back support for recover. Requiring several minutes to return to 90s on HFNC and NRB    Exercises     Shoulder Instructions      Home Living Family/patient expects to be discharged to:: Private residence Living Arrangements: Spouse/significant other;Children(Wife and two sons) Available Help at Discharge: Family;Available 24 hours/day Type of Home: House                           Additional Comments: Pt unable to provide home information due to fatigue and decreased SpO2 with conversation      Prior Functioning/Environment Level of Independence: Independent         Comments: Pt reporting he was independent        OT Problem List: Decreased strength;Decreased range of motion;Decreased activity tolerance;Impaired balance (sitting and/or standing);Decreased knowledge of use of DME or AE;Decreased knowledge of precautions;Pain;Cardiopulmonary status limiting activity      OT Treatment/Interventions: Self-care/ADL training;Therapeutic exercise;Energy conservation;DME and/or AE instruction;Therapeutic activities;Patient/family education    OT Goals(Current goals can be found in the care plan section) Acute Rehab OT Goals Patient Stated Goal: agreed with progressive activity OT Goal Formulation: With patient Time For Goal Achievement: 09/10/19 Potential to Achieve Goals: Good  OT Frequency: Min 3X/week   Barriers to D/C:            Co-evaluation              AM-PAC OT "6 Clicks" Daily Activity     Outcome Measure Help from another person eating meals?: None Help from another person taking care of personal grooming?: A Little Help from another person toileting, which includes using toliet, bedpan, or urinal?: A Little Help from another person bathing (including washing, rinsing, drying)?: A Little Help from another person to put on and taking off regular upper body clothing?: A Little Help from another person to put on and taking off regular lower body  clothing?: A Little 6 Click Score: 19   End of Session Equipment Utilized During Treatment: Oxygen Nurse Communication: Mobility status  Activity Tolerance: Patient tolerated treatment well;Patient limited by fatigue Patient left: in chair;with call bell/phone within reach  OT Visit Diagnosis: Unsteadiness on feet (R26.81);Other abnormalities of gait and mobility (R26.89);Muscle weakness (generalized) (M62.81);Pain Pain - part of body: (Generalized)                Time: 9494-4739 OT Time Calculation (min): 40 min Charges:  OT General Charges $OT Visit: 1 Visit OT Evaluation $OT  Eval Moderate Complexity: 1 Mod OT Treatments $Self Care/Home Management : 23-37 mins  Tanai Bouler MSOT, OTR/L Acute Rehab Pager: [phone] Office: [phone]  Theodoro Grist Johnattan Strassman 08/27/2019, 2:35 PM

## 2019-08-28 LAB — COMPREHENSIVE METABOLIC PANEL
ALT: 28 U/L (ref 0–44)
ALT: 35 U/L (ref 0–44)
AST: 17 U/L (ref 15–41)
AST: 25 U/L (ref 15–41)
Albumin: 2.5 g/dL — ABNORMAL LOW (ref 3.5–5.0)
Albumin: 2.6 g/dL — ABNORMAL LOW (ref 3.5–5.0)
Alkaline Phosphatase: 53 U/L (ref 38–126)
Alkaline Phosphatase: 62 U/L (ref 38–126)
Anion gap: 6 (ref 5–15)
Anion gap: 9 (ref 5–15)
BUN: 37 mg/dL — ABNORMAL HIGH (ref 8–23)
BUN: 46 mg/dL — ABNORMAL HIGH (ref 8–23)
CO2: 29 mmol/L (ref 22–32)
CO2: 24 mmol/L (ref 22–32)
Calcium: 8.3 mg/dL — ABNORMAL LOW (ref 8.9–10.3)
Calcium: 8.5 mg/dL — ABNORMAL LOW (ref 8.9–10.3)
Chloride: 102 mmol/L (ref 98–111)
Chloride: 98 mmol/L (ref 98–111)
Creatinine, Ser: 0.68 mg/dL (ref 0.61–1.24)
Creatinine, Ser: 1.02 mg/dL (ref 0.61–1.24)
GFR calc Af Amer: >60 mL/min (ref >60)
GFR calc Af Amer: >60 mL/min (ref >60)
GFR calc non Af Amer: >60 mL/min (ref >60)
GFR calc non Af Amer: >60 mL/min (ref >60)
Glucose, Bld: 78 mg/dL (ref 70–99)
Glucose, Bld: 374 mg/dL — ABNORMAL HIGH (ref 70–99)
Potassium: 4.6 mmol/L (ref 3.5–5.1)
Potassium: 5.8 mmol/L — ABNORMAL HIGH (ref 3.5–5.1)
Sodium: 137 mmol/L (ref 135–145)
Sodium: 131 mmol/L — ABNORMAL LOW (ref 135–145)
Total Bilirubin: 0.6 mg/dL (ref 0.3–1.2)
Total Bilirubin: 0.7 mg/dL (ref 0.3–1.2)
Total Protein: 4.6 g/dL — ABNORMAL LOW (ref 6.5–8.1)
Total Protein: 4.9 g/dL — ABNORMAL LOW (ref 6.5–8.1)

## 2019-08-28 LAB — GLUCOSE, CAPILLARY
Glucose-Capillary: 122 mg/dL — ABNORMAL HIGH (ref 70–99)
Glucose-Capillary: 56 mg/dL — ABNORMAL LOW (ref 70–99)
Glucose-Capillary: 295 mg/dL — ABNORMAL HIGH (ref 70–99)
Glucose-Capillary: 410 mg/dL — ABNORMAL HIGH (ref 70–99)
Glucose-Capillary: 234 mg/dL — ABNORMAL HIGH (ref 70–99)
Glucose-Capillary: 230 mg/dL — ABNORMAL HIGH (ref 70–99)
Glucose-Capillary: 169 mg/dL — ABNORMAL HIGH (ref 70–99)

## 2019-08-28 LAB — D-DIMER, QUANTITATIVE: D-Dimer, Quant: 2.2 ug/mL-FEU — ABNORMAL HIGH (ref 0.00–0.50)

## 2019-08-28 LAB — FERRITIN: Ferritin: 300 ng/mL (ref 24–336)

## 2019-08-28 LAB — C-REACTIVE PROTEIN: CRP: <0.5 mg/dL (ref <1.0)

## 2019-08-28 MED ORDER — INSULIN ASPART 100 UNIT/ML ~~LOC~~ SOLN: 5.0000 [IU] | Freq: Once | SUBCUTANEOUS | Status: DC

## 2019-08-28 MED ORDER — INSULIN ASPART 100 UNIT/ML IV SOLN
10.0000 [IU] | Freq: Once | INTRAVENOUS | Status: AC
  Administered 2019-08-28: 10 [IU] via INTRAVENOUS

## 2019-08-28 MED ORDER — INSULIN ASPART 100 UNIT/ML IV SOLN
5.0000 [IU] | Freq: Once | INTRAVENOUS | Status: AC
  Administered 2019-08-28: 23:00:00 5 [IU] via INTRAVENOUS

## 2019-08-28 MED ORDER — DEXTROSE 50 % IV SOLN
1.0000 | Freq: Once | INTRAVENOUS | Status: AC
  Administered 2019-08-28: 50 mL via INTRAVENOUS
  Filled 2019-08-28: qty 50

## 2019-08-28 MED ORDER — INSULIN ASPART 100 UNIT/ML ~~LOC~~ SOLN
10.0000 [IU] | Freq: Three times a day (TID) | SUBCUTANEOUS | Status: DC
  Administered 2019-08-28 – 2019-09-01 (×9): 10 [IU] via SUBCUTANEOUS

## 2019-08-28 MED ORDER — INSULIN REGULAR HUMAN 100 UNIT/ML IJ SOLN: 5.0000 [IU] | Freq: Once | INTRAMUSCULAR | Status: DC

## 2019-08-28 MED ORDER — INSULIN DETEMIR 100 UNIT/ML ~~LOC~~ SOLN
10.0000 [IU] | Freq: Every day | SUBCUTANEOUS | Status: DC
  Administered 2019-08-29 – 2019-09-02 (×5): 10 [IU] via SUBCUTANEOUS
  Filled 2019-08-28 (×6): qty 0.1

## 2019-08-28 NOTE — Progress Notes (Addendum)
PROGRESS NOTE    Aaron Blair  IWP:[id number] DOB: Feb 02, 1934 DOA: 08/13/2019 PCP: No primary care provider on file.    Brief Narrative:  86 year old male who presented with dyspnea. He does have significant past medical history for type 2 diabetes mellitus, hypertension and dyslipidemia. Patient was transferred from [facility] for management acute hypoxic failure due to SARS COVID-19 viral pneumonia. Patient reported 7 days of fevers, chills, poor appetite, dyspnea and lethargy. On his initial physical examination his oximetry was in the 50s. Blood pressure 150/89, pulse rate 87, temperature 98.2, respiratory 22, oxygen saturation 93% on supplemental oxygen. His lungs had diffuse rales bilaterally, heart S1-S2 present rhythmic, abdomen soft, no lower extremity edema. SARS COVID-19 positive.His chest radiograph had bilateral interstitial infiltrates, base of the right upper lobe and left lower lobe.  Patient was admitted to the hospital working diagnosis of acute hypoxic respiratory failure due to SARS COVID-19 viral pneumonia.  Patient with persistent hypoxic respiratory failure, he has been treated with steroids, remdesivir, Actemra x2 and IV furosemide. CT chest was negative for pulmonary embolism.  Very slow to response, has COPD at baseline with emphysematous changes on CT chest. Follow up chest film (01/14) personally reviewed noted improvement on bilateral infiltrates, plan to continue to titrate down supplemental 02 to target 02 saturation greater than 88%.    Assessment & Plan:   Principal Problem:   Acute hypoxemic respiratory failure due to severe acute respiratory syndrome coronavirus 2 (SARS-CoV-2) disease (HCC) Active Problems:   Chronic diastolic CHF (congestive heart failure) (HCC)   Hypertension   Hyperlipidemia   Demand ischemia (HCC)   Pneumonia due to COVID-19 virus   Uncontrolled type 2 diabetes mellitus with complication (HCC)   Elevated troponin  Essential hypertension   1.Acute hypoxic respiratory failure due to SARS COVID-19 viral pneumonia/ significant lung injury due to SARS COVID 19. Significant lung injury due to COVID 19.   Sp Remdesivir #5/5.Tocilizumab 12/30 and 01/7.  RR: 12 to 20  Pulse oxymetry: 89 to 91%   Fi02: 15 L/ min per HFNC  COVID-19 Labs  Recent Labs    08/26/19 0206 08/27/19 0815 08/28/19 0358  DDIMER 3.33* 3.08* 2.20*  FERRITIN 310 387* 300  CRP 0.5 <0.5 <0.5    No results found for: SARSCOV2NAA  Inflammatory markers continuetrending down.Able to wean off NRBM, today only on HFNC.   Slow taper of systemic steroids, continue with 40 mg methylprednisoloneIV daily #3. On bronchodilator therapy, antitussive agents  and airway clearing techniques with flutter valve and incentive spirometer.Continue to encourage of bed tid to chair as tolerated, PT and OT.  Patient continue to be at high risk for worsening respiratory failure. (home health)  2. COPD with acute exacerbation.On systemicsteroids and bronchodilators. Tolerating well inhaled steroids with momentasone.   3.Acute on chronic diastolic CHF/ HTN.sp diuresis for volume overload, continue with amodipine and lisinopril for blood pressure control.   4. Hypokalemia. Renal function continue to be stable with serum cr at 0,68.   5. Uncontrolled T2DM (Hgb A1c 9,4) with steroid induced hyperglycemia/ hypoglycemia. Fasting glucose this am is 78, capillary 56, Will continue with insulin sliding scale for glucose cover and monitoring and  pre-meal coverage (down to 10 units from 12), will decrease basal insulin to 10 units daily. Continue with nutrition supplements.   DVT prophylaxis:enoxaparin high dose Code Status:dnr Family Communication:I spoke over the phone with the patient's son about patient's  condition, plan of care, prognosis and all questions were addressed. Disposition Plan/ discharge barriers:pending  improvement in oxygenation.    Subjective: Patient has been successfully weaned off non rebreather mask, now using only high flow nasal cannula. Denies any chest pain and apparently dyspnea is improving. Tolerating po well.   Objective: Vitals:   08/27/19 2329 08/28/19 0410 08/28/19 0631 08/28/19 0802  BP: (!) 131/58 132/69  (!) 97/54  Pulse: 81 79  93  Resp: 12 13 12 20   Temp: 97.6 F (36.4 C) (!) 97.4 F (36.3 C)  98.2 F (36.8 C)  TempSrc: Oral Oral  Oral  SpO2: 94% 91% (!) 85% (!) 89%  Weight:      Height:        Intake/Output Summary (Last 24 hours) at 08/28/2019 9702 Last data filed at 08/28/2019 0400 Gross per 24 hour  Intake --  Output 525 ml  Net -525 ml   Filed Weights   08/24/19 0400 08/26/19 0500 08/27/19 0500  Weight: 62.7 kg 62.5 kg 62 kg    Examination:   General: Not in pain, deconditioned  Neurology: Awake and alert, non focal  E ENT: mild pallor, no icterus, oral mucosa moist Cardiovascular: No JVD. S1-S2 present, rhythmic. No lower extremity edema. Pulmonary: positive breath sounds bilaterally. Gastrointestinal. Abdomen with no organomegaly, non tender, no rebound or guarding Skin. No rashes Musculoskeletal: no joint deformities     Data Reviewed: I have personally reviewed following labs and imaging studies  CBC: Recent Labs  Lab 08/22/19 0441 08/23/19 0708 08/24/19 0315 08/25/19 0425  WBC 14.6* 14.2* 15.6* 12.2*  NEUTROABS 13.3* 13.3* 14.8* 11.6*  HGB 12.5* 12.2* 12.7* 10.5*  HCT 37.5* 35.7* 36.6* 30.7*  MCV 97.2 98.3 97.3 98.1  PLT 164 122* 152 637*   Basic Metabolic Panel: Recent Labs  Lab 08/22/19 0441 08/22/19 0441 08/23/19 0708 08/23/19 0708 08/24/19 0315 08/24/19 0436 08/25/19 0425 08/26/19 0206 08/27/19 0815 08/28/19 0358  NA 136   < > 136   < > 137  --  136 136 138 137  K 4.5   < > 4.7   < > 4.2  --  3.8 3.8 4.1 4.6  CL 100   < > 103   < > 102  --  99 100 101 102  CO2 27   < > 25   < > 26  --  27 29 29 29    GLUCOSE 103*   < > 96   < > 136*  --  235* 137* 100* 78  BUN 40*   < > 24*   < > 32*  --  37* 39* 35* 37*  CREATININE 1.03   < > 0.71   < > 0.90  --  0.93 0.81 0.72 0.68  CALCIUM 8.7*   < > 8.4*   < > 8.8*  --  8.2* 8.2* 8.3* 8.3*  MG 2.2  --  2.1  --   --  2.2 2.1  --   --   --   PHOS 4.5  --  3.3  --   --  3.6 3.8  --   --   --    < > = values in this interval not displayed.   GFR: Estimated Creatinine Clearance: 56.3 mL/min (by C-G formula based on SCr of 0.68 mg/dL). Liver Function Tests: Recent Labs  Lab 08/24/19 0315 08/25/19 0425 08/26/19 0206 08/27/19 0815 08/28/19 0358  AST 20 17 18 25 17   ALT 24 20 20 30 28   ALKPHOS 77 62 52 52 53  BILITOT 1.0 0.8 0.8 0.8 0.6  PROT 5.3* 4.8* 4.6* 4.8* 4.6*  ALBUMIN 2.5* 2.7* 2.5* 2.5* 2.5*   No results for input(s): LIPASE, AMYLASE in the last 168 hours. No results for input(s): AMMONIA in the last 168 hours. Coagulation Profile: No results for input(s): INR, PROTIME in the last 168 hours. Cardiac Enzymes: No results for input(s): CKTOTAL, CKMB, CKMBINDEX, TROPONINI in the last 168 hours. BNP (last 3 results) No results for input(s): PROBNP in the last 8760 hours. HbA1C: No results for input(s): HGBA1C in the last 72 hours. CBG: Recent Labs  Lab 08/27/19 0722 08/27/19 1200 08/27/19 1620 08/27/19 2023 08/28/19 0734  GLUCAP 103* 161* 178* 190* 56*   Lipid Profile: No results for input(s): CHOL, HDL, LDLCALC, TRIG, CHOLHDL, LDLDIRECT in the last 72 hours. Thyroid Function Tests: No results for input(s): TSH, T4TOTAL, FREET4, T3FREE, THYROIDAB in the last 72 hours. Anemia Panel: Recent Labs    08/26/19 0206 08/27/19 0815  FERRITIN 310 387*      Radiology Studies: I have reviewed all of the imaging during this hospital visit personally     Scheduled Meds: . amLODipine  5 mg Oral Daily  . vitamin C  500 mg Oral Daily  . aspirin EC  81 mg Oral Daily  . Chlorhexidine Gluconate Cloth  6 each Topical Daily  .  chlorpheniramine-HYDROcodone  5 mL Oral Q12H  . docusate sodium  100 mg Oral Daily  . enoxaparin (LOVENOX) injection  40 mg Subcutaneous Q12H  . fluconazole  100 mg Oral QHS  . insulin aspart  0-20 Units Subcutaneous TID WC  . insulin aspart  0-5 Units Subcutaneous QHS  . insulin aspart  12 Units Subcutaneous TID WC  . insulin detemir  10 Units Subcutaneous BID  . Ipratropium-Albuterol  2 puff Inhalation QID  . lisinopril  10 mg Oral Daily  . methylPREDNISolone (SOLU-MEDROL) injection  40 mg Intravenous Daily  . mometasone-formoterol  2 puff Inhalation BID  . mupirocin ointment   Nasal BID  . zinc sulfate  220 mg Oral Daily   Continuous Infusions: . sodium chloride Stopped (Sep 07, 2019 2259)     LOS: 18 days        Avory Rahimi Annett Gula, MD

## 2019-08-28 NOTE — Progress Notes (Signed)
Called Pt's son. Updated on pt status. All questions answered.

## 2019-08-29 LAB — CULTURE, BLOOD (ROUTINE X 2)
Culture: NO GROWTH
Culture: NO GROWTH
Special Requests: ADEQUATE
Special Requests: ADEQUATE

## 2019-08-29 LAB — COMPREHENSIVE METABOLIC PANEL
ALT: 40 U/L (ref 0–44)
AST: 21 U/L (ref 15–41)
Albumin: 3 g/dL — ABNORMAL LOW (ref 3.5–5.0)
Alkaline Phosphatase: 60 U/L (ref 38–126)
Anion gap: 9 (ref 5–15)
BUN: 48 mg/dL — ABNORMAL HIGH (ref 8–23)
CO2: 29 mmol/L (ref 22–32)
Calcium: 9.1 mg/dL (ref 8.9–10.3)
Chloride: 99 mmol/L (ref 98–111)
Creatinine, Ser: 0.76 mg/dL (ref 0.61–1.24)
GFR calc Af Amer: >60 mL/min (ref >60)
GFR calc non Af Amer: >60 mL/min (ref >60)
Glucose, Bld: 58 mg/dL — ABNORMAL LOW (ref 70–99)
Potassium: 4.8 mmol/L (ref 3.5–5.1)
Sodium: 137 mmol/L (ref 135–145)
Total Bilirubin: 0.8 mg/dL (ref 0.3–1.2)
Total Protein: 5.7 g/dL — ABNORMAL LOW (ref 6.5–8.1)

## 2019-08-29 LAB — GLUCOSE, CAPILLARY
Glucose-Capillary: 115 mg/dL — ABNORMAL HIGH (ref 70–99)
Glucose-Capillary: 117 mg/dL — ABNORMAL HIGH (ref 70–99)
Glucose-Capillary: 126 mg/dL — ABNORMAL HIGH (ref 70–99)
Glucose-Capillary: 137 mg/dL — ABNORMAL HIGH (ref 70–99)
Glucose-Capillary: 138 mg/dL — ABNORMAL HIGH (ref 70–99)
Glucose-Capillary: 165 mg/dL — ABNORMAL HIGH (ref 70–99)
Glucose-Capillary: 252 mg/dL — ABNORMAL HIGH (ref 70–99)
Glucose-Capillary: 98 mg/dL (ref 70–99)

## 2019-08-29 LAB — C-REACTIVE PROTEIN: CRP: 0.5 mg/dL (ref <1.0)

## 2019-08-29 LAB — FERRITIN: Ferritin: 318 ng/mL (ref 24–336)

## 2019-08-29 LAB — D-DIMER, QUANTITATIVE: D-Dimer, Quant: 2.25 ug/mL-FEU — ABNORMAL HIGH (ref 0.00–0.50)

## 2019-08-29 NOTE — Progress Notes (Signed)
Spoke with patient's son, updated him on patient's condition and answered any questions.

## 2019-08-29 NOTE — Progress Notes (Signed)
PROGRESS NOTE    Aaron Blair  QIH:[id number] DOB: 01-11-1934 DOA: Sep 04, 2019 PCP: No primary care provider on file.    Brief Narrative:  86 year old male who presented with dyspnea. He does have significant past medical history for type 2 diabetes mellitus, hypertension and dyslipidemia. Patient was transferred from [facility] for management acute hypoxic failure due to SARS COVID-19 viral pneumonia. Patient reported 7 days of fevers, chills, poor appetite, dyspnea and lethargy. On his initial physical examination his oximetry was in the 50s. Blood pressure 150/89, pulse rate 87, temperature 98.2, respiratory 22, oxygen saturation 93% on supplemental oxygen. His lungs had diffuse rales bilaterally, heart S1-S2 present rhythmic, abdomen soft, no lower extremity edema. SARS COVID-19 positive.His chest radiograph had bilateral interstitial infiltrates, base of the right upper lobe and left lower lobe.  Patient was admitted to the hospital working diagnosis of acute hypoxic respiratory failure due to SARS COVID-19 viral pneumonia.  Patient with persistent hypoxic respiratory failure, he has been treated with steroids, remdesivir, Actemra x2 and IV furosemide. CT chest was negative for pulmonary embolism.  Very slow to response, has COPD at baseline with emphysematous changes on CT chest. Follow upchest film(01/14)personally reviewed noted improvement on bilateral infiltrates,plan tocontinue to titrate down supplemental 02 to target 02 saturation greater than 88%.    Assessment & Plan:   Principal Problem:   Acute hypoxemic respiratory failure due to severe acute respiratory syndrome coronavirus 2 (SARS-CoV-2) disease (HCC) Active Problems:   Chronic diastolic CHF (congestive heart failure) (HCC)   Hypertension   Hyperlipidemia   Demand ischemia ([place])   Pneumonia due to COVID-19 virus   Uncontrolled type 2 diabetes mellitus with complication (HCC)   Elevated troponin  Essential hypertension    1.Acute hypoxic respiratory failure due to SARS COVID-19 viral pneumonia/ significant lung injury due to SARS COVID 19.Significant lung injury due to COVID 19.   Sp Remdesivir #5/5.Tocilizumab 12/30 and 01/7.  RR: 14 to 17  Pulse oxymetry: 99 to 95%  Fi02: 14 to 15 L/ min per HFNC  COVID-19 Labs  Recent Labs    08/27/19 0815 08/28/19 0358 08/29/19 0150  DDIMER 3.08* 2.20* 2.25*  FERRITIN 387* 300 318  CRP <0.5 <0.5 0.5    No results found for: SARSCOV2NAA   Inflammatory markers are stable.  Continue with a slow taper of systemic steroids,40 mg methylprednisoloneIVdaily #4. Continue  bronchodilator therapy, antitussive agents  and airway clearing techniques with flutter valve and incentive spirometer.  Patient has been out of bed to chair.  Patient continue to be at high risk for worsening respiratory failure.   2. COPD with acute exacerbation.Continue with systemicand inhaled steroids  Bronchodilators. Keep oxygen saturation more than 88%   3.Acute on chronic diastolic CHF/ HTN.spdiuresis for volume overload, Blood pressure control with amodipine and lisinopril.  4. Hypokalemia/ hyperkalemia.K corrected to 4,8, stable renal function with serum cr at 0,76, will continue close follow up of renal function and electrolytes. Avoid hypotension and nephrotoxic medications.   5. Uncontrolled T2DM(Hgb A1c 9,4)with steroid induced hyperglycemia/ hypoglycemia. Fasting glucosethis am is58, capillary 165, 138,  On insulin sliding scale for glucose cover and monitoring, continue  pre-meal coverage and basal insulin.   DVT prophylaxis:enoxaparin high dose Code Status:dnr Family Communication: no family at the bedside  Disposition Plan/ discharge barriers:pending improvement in oxygenation.    Subjective: Patient continue to have high oxygen requirements, no nausea or vomiting, no chest pain. Positive fatigue and  poor appetite. I explained him his condition and plan of  care, slow to progress, baseline emphysema. (Interpreter used)  Objective: Vitals:   08/28/19 2029 08/29/19 0000 08/29/19 0500 08/29/19 0800  BP: 118/62 127/67 135/66 134/64  Pulse: (!) 101 97 85 96  Resp: 19 13 14 17   Temp: 98.7 F (37.1 C) 97.6 F (36.4 C) 97.6 F (36.4 C) 97.6 F (36.4 C)  TempSrc: Oral Oral Oral Oral  SpO2: 96% 95% 99% 95%  Weight:      Height:        Intake/Output Summary (Last 24 hours) at 08/29/2019 0839 Last data filed at 08/29/2019 0000 Gross per 24 hour  Intake 1240 ml  Output 250 ml  Net 990 ml   Filed Weights   08/24/19 0400 08/26/19 0500 08/27/19 0500  Weight: 62.7 kg 62.5 kg 62 kg    Examination:   General: deconditioned  Neurology: Awake and alert, non focal  E ENT: mild pallor, no icterus, oral mucosa moist Cardiovascular: No JVD. S1-S2 present. No lower extremity edema. Pulmonary: positive  breath sounds bilaterally. Gastrointestinal. Abdomen with no organomegaly, non tender, no rebound or guarding Skin. No rashes Musculoskeletal: no joint deformities     Data Reviewed: I have personally reviewed following labs and imaging studies  CBC: Recent Labs  Lab 08/23/19 0708 08/24/19 0315 08/25/19 0425  WBC 14.2* 15.6* 12.2*  NEUTROABS 13.3* 14.8* 11.6*  HGB 12.2* 12.7* 10.5*  HCT 35.7* 36.6* 30.7*  MCV 98.3 97.3 98.1  PLT 122* 152 126*   Basic Metabolic Panel: Recent Labs  Lab 08/23/19 0708 08/24/19 0315 08/24/19 0436 08/25/19 0425 08/25/19 0425 08/26/19 0206 08/27/19 0815 08/28/19 0358 08/28/19 1735 08/29/19 0150  NA 136   < >  --  136   < > 136 138 137 131* 137  K 4.7   < >  --  3.8   < > 3.8 4.1 4.6 5.8* 4.8  CL 103   < >  --  99   < > 100 101 102 98 99  CO2 25   < >  --  27   < > 29 29 29 24 29   GLUCOSE 96   < >  --  235*   < > 137* 100* 78 374* 58*  BUN 24*   < >  --  37*   < > 39* 35* 37* 46* 48*  CREATININE 0.71   < >  --  0.93   < > 0.81 0.72 0.68  1.02 0.76  CALCIUM 8.4*   < >  --  8.2*   < > 8.2* 8.3* 8.3* 8.5* 9.1  MG 2.1  --  2.2 2.1  --   --   --   --   --   --   PHOS 3.3  --  3.6 3.8  --   --   --   --   --   --    < > = values in this interval not displayed.   GFR: Estimated Creatinine Clearance: 56.3 mL/min (by C-G formula based on SCr of 0.76 mg/dL). Liver Function Tests: Recent Labs  Lab 08/26/19 0206 08/27/19 0815 08/28/19 0358 08/28/19 1735 08/29/19 0150  AST 18 25 17 25 21   ALT 20 30 28  35 40  ALKPHOS 52 52 53 62 60  BILITOT 0.8 0.8 0.6 0.7 0.8  PROT 4.6* 4.8* 4.6* 4.9* 5.7*  ALBUMIN 2.5* 2.5* 2.5* 2.6* 3.0*   No results for input(s): LIPASE, AMYLASE in the last 168 hours. No results for input(s): AMMONIA in the last  168 hours. Coagulation Profile: No results for input(s): INR, PROTIME in the last 168 hours. Cardiac Enzymes: No results for input(s): CKTOTAL, CKMB, CKMBINDEX, TROPONINI in the last 168 hours. BNP (last 3 results) No results for input(s): PROBNP in the last 8760 hours. HbA1C: No results for input(s): HGBA1C in the last 72 hours. CBG: Recent Labs  Lab 08/29/19 0029 08/29/19 0346 08/29/19 0502 08/29/19 0628 08/29/19 0759  GLUCAP 137* 115* 98 117* 165*   Lipid Profile: No results for input(s): CHOL, HDL, LDLCALC, TRIG, CHOLHDL, LDLDIRECT in the last 72 hours. Thyroid Function Tests: No results for input(s): TSH, T4TOTAL, FREET4, T3FREE, THYROIDAB in the last 72 hours. Anemia Panel: Recent Labs    08/28/19 0358 08/29/19 0150  FERRITIN 300 318      Radiology Studies: I have reviewed all of the imaging during this hospital visit personally     Scheduled Meds: . amLODipine  5 mg Oral Daily  . vitamin C  500 mg Oral Daily  . aspirin EC  81 mg Oral Daily  . Chlorhexidine Gluconate Cloth  6 each Topical Daily  . chlorpheniramine-HYDROcodone  5 mL Oral Q12H  . docusate sodium  100 mg Oral Daily  . enoxaparin (LOVENOX) injection  40 mg Subcutaneous Q12H  . fluconazole  100 mg  Oral QHS  . insulin aspart  0-20 Units Subcutaneous TID WC  . insulin aspart  0-5 Units Subcutaneous QHS  . insulin aspart  10 Units Subcutaneous TID WC  . insulin detemir  10 Units Subcutaneous Daily  . Ipratropium-Albuterol  2 puff Inhalation QID  . lisinopril  10 mg Oral Daily  . methylPREDNISolone (SOLU-MEDROL) injection  40 mg Intravenous Daily  . mometasone-formoterol  2 puff Inhalation BID  . mupirocin ointment   Nasal BID  . zinc sulfate  220 mg Oral Daily   Continuous Infusions: . sodium chloride Stopped (08/24/19 2259)     LOS: 19 days        Iraida Cragin Annett Gula, MD

## 2019-08-30 ENCOUNTER — Inpatient Hospital Stay (HOSPITAL_COMMUNITY): Payer: Medicare Other

## 2019-08-30 LAB — BASIC METABOLIC PANEL
Anion gap: 9 (ref 5–15)
Anion gap: 11 (ref 5–15)
BUN: 48 mg/dL — ABNORMAL HIGH (ref 8–23)
BUN: 55 mg/dL — ABNORMAL HIGH (ref 8–23)
CO2: 28 mmol/L (ref 22–32)
CO2: 26 mmol/L (ref 22–32)
Calcium: 8.9 mg/dL (ref 8.9–10.3)
Calcium: 9.9 mg/dL (ref 8.9–10.3)
Chloride: 95 mmol/L — ABNORMAL LOW (ref 98–111)
Chloride: 97 mmol/L — ABNORMAL LOW (ref 98–111)
Creatinine, Ser: 0.95 mg/dL (ref 0.61–1.24)
Creatinine, Ser: 1.1 mg/dL (ref 0.61–1.24)
GFR calc Af Amer: >60 mL/min (ref >60)
GFR calc Af Amer: >60 mL/min (ref >60)
GFR calc non Af Amer: >60 mL/min (ref >60)
GFR calc non Af Amer: >60 mL/min (ref >60)
Glucose, Bld: 230 mg/dL — ABNORMAL HIGH (ref 70–99)
Glucose, Bld: 44 mg/dL — CL (ref 70–99)
Potassium: 5.9 mmol/L — ABNORMAL HIGH (ref 3.5–5.1)
Potassium: 5.9 mmol/L — ABNORMAL HIGH (ref 3.5–5.1)
Sodium: 132 mmol/L — ABNORMAL LOW (ref 135–145)
Sodium: 134 mmol/L — ABNORMAL LOW (ref 135–145)

## 2019-08-30 LAB — COMPREHENSIVE METABOLIC PANEL
ALT: 37 U/L (ref 0–44)
AST: 22 U/L (ref 15–41)
Albumin: 2.8 g/dL — ABNORMAL LOW (ref 3.5–5.0)
Alkaline Phosphatase: 60 U/L (ref 38–126)
Anion gap: 9 (ref 5–15)
BUN: 60 mg/dL — ABNORMAL HIGH (ref 8–23)
CO2: 27 mmol/L (ref 22–32)
Calcium: 9.6 mg/dL (ref 8.9–10.3)
Chloride: 97 mmol/L — ABNORMAL LOW (ref 98–111)
Creatinine, Ser: 1.35 mg/dL — ABNORMAL HIGH (ref 0.61–1.24)
GFR calc Af Amer: 56 mL/min — ABNORMAL LOW (ref >60)
GFR calc non Af Amer: 48 mL/min — ABNORMAL LOW (ref >60)
Glucose, Bld: 192 mg/dL — ABNORMAL HIGH (ref 70–99)
Potassium: 6.3 mmol/L (ref 3.5–5.1)
Sodium: 133 mmol/L — ABNORMAL LOW (ref 135–145)
Total Bilirubin: 0.7 mg/dL (ref 0.3–1.2)
Total Protein: 5.2 g/dL — ABNORMAL LOW (ref 6.5–8.1)

## 2019-08-30 LAB — GLUCOSE, CAPILLARY
Glucose-Capillary: 236 mg/dL — ABNORMAL HIGH (ref 70–99)
Glucose-Capillary: 269 mg/dL — ABNORMAL HIGH (ref 70–99)
Glucose-Capillary: 191 mg/dL — ABNORMAL HIGH (ref 70–99)
Glucose-Capillary: 307 mg/dL — ABNORMAL HIGH (ref 70–99)
Glucose-Capillary: 47 mg/dL — ABNORMAL LOW (ref 70–99)
Glucose-Capillary: 173 mg/dL — ABNORMAL HIGH (ref 70–99)

## 2019-08-30 LAB — D-DIMER, QUANTITATIVE: D-Dimer, Quant: 1.32 ug/mL-FEU — ABNORMAL HIGH (ref 0.00–0.50)

## 2019-08-30 MED ORDER — SODIUM ZIRCONIUM CYCLOSILICATE 10 G PO PACK
10.0000 g | PACK | Freq: Two times a day (BID) | ORAL | Status: AC
  Administered 2019-08-30 (×2): 10 g via ORAL
  Filled 2019-08-30 (×2): qty 1

## 2019-08-30 MED ORDER — CALCIUM GLUCONATE-NACL 1-0.675 GM/50ML-% IV SOLN
1.0000 g | Freq: Once | INTRAVENOUS | Status: AC
  Administered 2019-08-30: 1000 mg via INTRAVENOUS
  Filled 2019-08-30: qty 50

## 2019-08-30 MED ORDER — INSULIN ASPART 100 UNIT/ML IV SOLN
10.0000 [IU] | Freq: Once | INTRAVENOUS | Status: AC
  Administered 2019-08-30: 10 [IU] via INTRAVENOUS

## 2019-08-30 MED ORDER — DEXTROSE 50 % IV SOLN
1.0000 | Freq: Once | INTRAVENOUS | Status: AC
  Administered 2019-08-30: 50 mL via INTRAVENOUS
  Filled 2019-08-30: qty 50

## 2019-08-30 MED ORDER — GLUCOSE 40 % PO GEL
ORAL | Status: AC
  Administered 2019-08-30: 2 via ORAL
  Filled 2019-08-30: qty 2

## 2019-08-30 MED ORDER — CALCIUM GLUCONATE-NACL 1-0.675 GM/50ML-% IV SOLN
1.0000 g | Freq: Once | INTRAVENOUS | Status: AC
  Administered 2019-08-30: 16:00:00 1000 mg via INTRAVENOUS
  Filled 2019-08-30: qty 50

## 2019-08-30 MED ORDER — GLUCOSE 40 % PO GEL
2.0000 | ORAL | Status: AC
  Administered 2019-08-30: 75 g via ORAL

## 2019-08-30 NOTE — Progress Notes (Signed)
Inpatient Diabetes Program Recommendations  AACE/ADA: New Consensus Statement on Inpatient Glycemic Control (2015)  Target Ranges:  Prepandial:   less than 140 mg/dL      Peak postprandial:   less than 180 mg/dL (1-2 hours)      Critically ill patients:  140 - 180 mg/dL   Lab Results  Component Value Date   GLUCAP 269 (H) 08/30/2019   HGBA1C 9.4 (H) 08/11/2019    Review of Glycemic Control Results for WASYL, DORNFELD (MRN [id number]) as of 08/30/2019 11:09  Ref. Range 08/29/2019 07:59 08/29/2019 11:41 08/29/2019 16:24 08/29/2019 20:30 08/30/2019 04:37 08/30/2019 07:55  Glucose-Capillary Latest Ref Range: 70 - 99 mg/dL 779 (H) 396 (H) 886 (H) 252 (H) 236 (H) 269 (H)   Diabetes history: DM 2 Outpatient Diabetes medications: Amaryl 4 mg Daily, Metformin 1000 gm Daily at bedtime, Actos 45 mg Daily Current orders for Inpatient glycemic control:  Levemir 10 units Daily Novolog 0-20 units tid + hs Novolog 10 units tid meal coverage  Solumedrol 40 mg Daily BUN/Creat: 48/0.95  Inpatient Diabetes Program Recommendations:    PT had hypoglycemia on Levemir 10 units bid, however glucose trends elevated still with reduction to 10 units Daily.  Renal function slowly increasing.  Increase Levemir to 12-15 units.   Thanks,  Christena Deem RN, MSN, BC-ADM Inpatient Diabetes Coordinator Team Pager [phone] (8a-5p)

## 2019-08-30 NOTE — Progress Notes (Addendum)
PROGRESS NOTE    Aaron Blair  PNT:[id number] DOB: [birth year] DOA: 08/07/2019 PCP: No primary care provider on file.    Brief Narrative:  86 year old male who presented with dyspnea. He does have significant past medical history for type 2 diabetes mellitus, hypertension and dyslipidemia. Patient was transferred from [facility] for management acute hypoxic failure due to SARS COVID-19 viral pneumonia. Patient reported 7 days of fevers, chills, poor appetite, dyspnea and lethargy. On his initial physical examination his oximetry was in the 50s. Blood pressure 150/89, pulse rate 87, temperature 98.2, respiratory 22, oxygen saturation 93% on supplemental oxygen. His lungs had diffuse rales bilaterally, heart S1-S2 present rhythmic, abdomen soft, no lower extremity edema. SARS COVID-19 positive.His chest radiograph had bilateral interstitial infiltrates, base of the right upper lobe and left lower lobe.  Patient was admitted to the hospital working diagnosis of acute hypoxic respiratory failure due to SARS COVID-19 viral pneumonia.  Patient with persistent hypoxic respiratory failure, he has been treated with steroids, remdesivir, Actemra x2 and IV furosemide. CT chest was negative for pulmonary embolism.  Very slow to response, has COPD at baseline with emphysematous changes on CT chest. Follow upchest film(01/14)personally reviewed noted improvement on bilateral infiltrates,plan tocontinue to titrate down supplemental 02 to target 02 saturation greater than 88%.    Assessment & Plan:   Principal Problem:   Acute hypoxemic respiratory failure due to severe acute respiratory syndrome coronavirus 2 (SARS-CoV-2) disease (HCC) Active Problems:   Chronic diastolic CHF (congestive heart failure) (HCC)   Hypertension   Hyperlipidemia   Demand ischemia (HCC)   Pneumonia due to COVID-19 virus   Uncontrolled type 2 diabetes mellitus with complication (HCC)   Elevated troponin  Essential hypertension   1.Acute hypoxic respiratory failure due to SARS COVID-19 viral pneumonia/ significant lung injury due to SARS COVID 19.Significant lung injury due to COVID 19.   Sp Remdesivir #5/5.Tocilizumab 12/30 and 01/7.  RR: 14  Pulse oxymetry: 92%  Fi02: 15 L/ min HFNC and NRBM  COVID-19 Labs  Recent Labs    08/28/19 0358 08/29/19 0150 08/30/19 0407  DDIMER 2.20* 2.25* 1.32*  FERRITIN 300 318  --   CRP <0.5 0.5  --     No results found for: SARSCOV2NAA  D dimer trending down, chest film personally reviewed noted bilateral interstitial infiltrates bilateral bases and left upper lobe stable from prior chest film. Patient continue to have very high oxygen requirements with HFNC plus NRBM, 15 L/ min each.   Will need a very slow taper ofsystemicsteroids,40 mg methylprednisoloneIVdaily #5.On bronchodilator therapy, antitussive agentsand airway clearing techniques with flutter valve and incentive spirometer.  Patient very weak and deconditioned.  Patient at a very high risk for worsening respiratory failure.  2. COPDwith acute exacerbation.Continue with systemic inhaled steroids, and bronchodilators. Patient continue to be very weak and deconditioned.   3.Acute on chronic diastolic CHF/ HTN.spdiuresis for volume overload, Will continue amodipine for blood pressure control, will dc lisinopril.due to hyperkalemia.   4. Hypokalemia/ hyperkalemia. K this am up to 5,9, will give calcium gluconate, dextrose and IV insulin, continue sodium zirconium by mouth x 2 doses. His renal function has remained stable with serum cr at 0.95.  5. Uncontrolled T2DM(Hgb A1c 9,4)with steroid induced hyperglycemia/ hypoglycemia. This am fasting glucose is 230. Continue with insulin sliding scale for glucose cover and monitoring, along with pre-meal coverage and basal insulin.   DVT prophylaxis:enoxaparin high dose Code Status:dnr Family  Communication:I spoke over the phone with the patient's son about  patient's  condition, plan of care, prognosis and all questions were addressed. Disposition Plan/ discharge barriers:pending improvement in oxygenation.    Subjective: Patient continue to use high flow nasal cannula and nrbm at 15 L/ min each, loos very weak and deconditioned. He mentions feeling well, not much interactive.   Objective: Vitals:   08/29/19 2000 08/30/19 0400 08/30/19 0438 08/30/19 0725  BP: 132/63 126/69  115/65  Pulse: 98 88    Resp: 20 14    Temp: 98.2 F (36.8 C) 98.6 F (37 C)    TempSrc: Oral Oral    SpO2: 90% 92%    Weight:   65 kg   Height:        Intake/Output Summary (Last 24 hours) at 08/30/2019 0845 Last data filed at 08/29/2019 2200 Gross per 24 hour  Intake 0 ml  Output 800 ml  Net -800 ml   Filed Weights   08/26/19 0500 08/27/19 0500 08/30/19 0438  Weight: 62.5 kg 62 kg 65 kg    Examination:   General: deconditioned, positive dyspnea at rest.  Neurology: Awake and alert, non focal  E ENT: positive pallor, no icterus, oral mucosa moist Cardiovascular: No JVD. S1-S2 present, rhythmic,. No lower extremity edema. Pulmonary: positive breath sounds bilaterally. Gastrointestinal. Abdomen with  no organomegaly, non tender, no rebound or guarding Skin. No rashes Musculoskeletal: no joint deformities     Data Reviewed: I have personally reviewed following labs and imaging studies  CBC: Recent Labs  Lab 08/24/19 0315 08/25/19 0425  WBC 15.6* 12.2*  NEUTROABS 14.8* 11.6*  HGB 12.7* 10.5*  HCT 36.6* 30.7*  MCV 97.3 98.1  PLT 152 126*   Basic Metabolic Panel: Recent Labs  Lab 08/24/19 0315 08/24/19 0436 08/25/19 0425 08/26/19 0206 08/27/19 0815 08/28/19 0358 08/28/19 1735 08/29/19 0150 08/30/19 0407  NA   < >  --  136   < > 138 137 131* 137 132*  K   < >  --  3.8   < > 4.1 4.6 5.8* 4.8 5.9*  CL   < >  --  99   < > 101 102 98 99 95*  CO2   < >  --  27   < >  29 29 24 29 28   GLUCOSE   < >  --  235*   < > 100* 78 374* 58* 230*  BUN   < >  --  37*   < > 35* 37* 46* 48* 48*  CREATININE   < >  --  0.93   < > 0.72 0.68 1.02 0.76 0.95  CALCIUM   < >  --  8.2*   < > 8.3* 8.3* 8.5* 9.1 8.9  MG  --  2.2 2.1  --   --   --   --   --   --   PHOS  --  3.6 3.8  --   --   --   --   --   --    < > = values in this interval not displayed.   GFR: Estimated Creatinine Clearance: 47.4 mL/min (by C-G formula based on SCr of 0.95 mg/dL). Liver Function Tests: Recent Labs  Lab 08/26/19 0206 08/27/19 0815 08/28/19 0358 08/28/19 1735 08/29/19 0150  AST 18 25 17 25 21   ALT 20 30 28  35 40  ALKPHOS 52 52 53 62 60  BILITOT 0.8 0.8 0.6 0.7 0.8  PROT 4.6* 4.8* 4.6* 4.9* 5.7*  ALBUMIN 2.5* 2.5* 2.5* 2.6*  3.0*   No results for input(s): LIPASE, AMYLASE in the last 168 hours. No results for input(s): AMMONIA in the last 168 hours. Coagulation Profile: No results for input(s): INR, PROTIME in the last 168 hours. Cardiac Enzymes: No results for input(s): CKTOTAL, CKMB, CKMBINDEX, TROPONINI in the last 168 hours. BNP (last 3 results) No results for input(s): PROBNP in the last 8760 hours. HbA1C: No results for input(s): HGBA1C in the last 72 hours. CBG: Recent Labs  Lab 08/29/19 1141 08/29/19 1624 08/29/19 2030 08/30/19 0437 08/30/19 0755  GLUCAP 138* 126* 252* 236* 269*   Lipid Profile: No results for input(s): CHOL, HDL, LDLCALC, TRIG, CHOLHDL, LDLDIRECT in the last 72 hours. Thyroid Function Tests: No results for input(s): TSH, T4TOTAL, FREET4, T3FREE, THYROIDAB in the last 72 hours. Anemia Panel: Recent Labs    08/28/19 0358 08/29/19 0150  FERRITIN 300 318      Radiology Studies: I have reviewed all of the imaging during this hospital visit personally     Scheduled Meds: . amLODipine  5 mg Oral Daily  . vitamin C  500 mg Oral Daily  . aspirin EC  81 mg Oral Daily  . Chlorhexidine Gluconate Cloth  6 each Topical Daily  .  chlorpheniramine-HYDROcodone  5 mL Oral Q12H  . docusate sodium  100 mg Oral Daily  . enoxaparin (LOVENOX) injection  40 mg Subcutaneous Q12H  . insulin aspart  0-20 Units Subcutaneous TID WC  . insulin aspart  0-5 Units Subcutaneous QHS  . insulin aspart  10 Units Subcutaneous TID WC  . insulin detemir  10 Units Subcutaneous Daily  . Ipratropium-Albuterol  2 puff Inhalation QID  . lisinopril  10 mg Oral Daily  . methylPREDNISolone (SOLU-MEDROL) injection  40 mg Intravenous Daily  . mometasone-formoterol  2 puff Inhalation BID  . mupirocin ointment   Nasal BID  . zinc sulfate  220 mg Oral Daily   Continuous Infusions: . sodium chloride Stopped (08/16/2019 2259)     LOS: 20 days        Dilynn Munroe Gerome Apley, MD

## 2019-08-30 NOTE — Progress Notes (Signed)
29- Update given to patient's son. Time allowed for questions and concerns. Patient up in chair. HFNC and NRB at 15 liters. Patient eats 100% lunch.

## 2019-08-30 NOTE — Plan of Care (Signed)
Dr. Geradine Girt to inform of critical potassium 6.3.

## 2019-08-31 LAB — BASIC METABOLIC PANEL
Anion gap: 14 (ref 5–15)
Anion gap: 10 (ref 5–15)
BUN: 53 mg/dL — ABNORMAL HIGH (ref 8–23)
BUN: 46 mg/dL — ABNORMAL HIGH (ref 8–23)
CO2: 23 mmol/L (ref 22–32)
CO2: 29 mmol/L (ref 22–32)
Calcium: 9.1 mg/dL (ref 8.9–10.3)
Calcium: 9 mg/dL (ref 8.9–10.3)
Chloride: 94 mmol/L — ABNORMAL LOW (ref 98–111)
Chloride: 96 mmol/L — ABNORMAL LOW (ref 98–111)
Creatinine, Ser: 1.23 mg/dL (ref 0.61–1.24)
Creatinine, Ser: 1.09 mg/dL (ref 0.61–1.24)
GFR calc Af Amer: >60 mL/min (ref >60)
GFR calc Af Amer: >60 mL/min (ref >60)
GFR calc non Af Amer: 54 mL/min — ABNORMAL LOW (ref >60)
GFR calc non Af Amer: >60 mL/min (ref >60)
Glucose, Bld: 339 mg/dL — ABNORMAL HIGH (ref 70–99)
Glucose, Bld: 238 mg/dL — ABNORMAL HIGH (ref 70–99)
Potassium: 6 mmol/L — ABNORMAL HIGH (ref 3.5–5.1)
Potassium: 6.1 mmol/L — ABNORMAL HIGH (ref 3.5–5.1)
Sodium: 131 mmol/L — ABNORMAL LOW (ref 135–145)
Sodium: 135 mmol/L (ref 135–145)

## 2019-08-31 LAB — D-DIMER, QUANTITATIVE: D-Dimer, Quant: 1.1 ug/mL-FEU — ABNORMAL HIGH (ref 0.00–0.50)

## 2019-08-31 LAB — GLUCOSE, CAPILLARY
Glucose-Capillary: 352 mg/dL — ABNORMAL HIGH (ref 70–99)
Glucose-Capillary: 150 mg/dL — ABNORMAL HIGH (ref 70–99)
Glucose-Capillary: 190 mg/dL — ABNORMAL HIGH (ref 70–99)
Glucose-Capillary: 239 mg/dL — ABNORMAL HIGH (ref 70–99)

## 2019-08-31 MED ORDER — PREDNISONE 20 MG PO TABS
20.0000 mg | ORAL_TABLET | Freq: Every day | ORAL | Status: DC
  Administered 2019-09-01 – 2019-09-03 (×3): 20 mg via ORAL
  Filled 2019-08-31 (×4): qty 1

## 2019-08-31 MED ORDER — CALCIUM GLUCONATE-NACL 1-0.675 GM/50ML-% IV SOLN
INTRAVENOUS | Status: AC
  Administered 2019-08-31: 1000 mg via INTRAVENOUS
  Filled 2019-08-31: qty 50

## 2019-08-31 MED ORDER — SODIUM CHLORIDE 0.9 % IV SOLN: INTRAVENOUS | Status: DC

## 2019-08-31 MED ORDER — DEXTROSE 50 % IV SOLN
1.0000 | Freq: Once | INTRAVENOUS | Status: AC
  Administered 2019-08-31: 50 mL via INTRAVENOUS
  Filled 2019-08-31: qty 50

## 2019-08-31 MED ORDER — SODIUM ZIRCONIUM CYCLOSILICATE 10 G PO PACK
10.0000 g | PACK | Freq: Two times a day (BID) | ORAL | Status: AC
  Administered 2019-08-31 (×2): 10 g via ORAL
  Filled 2019-08-31 (×2): qty 1

## 2019-08-31 MED ORDER — CALCIUM GLUCONATE-NACL 1-0.675 GM/50ML-% IV SOLN: 1.0000 g | Freq: Once | INTRAVENOUS | Status: AC

## 2019-08-31 MED ORDER — INSULIN ASPART 100 UNIT/ML IV SOLN
10.0000 [IU] | Freq: Once | INTRAVENOUS | Status: AC
  Administered 2019-08-31: 10 [IU] via INTRAVENOUS

## 2019-08-31 NOTE — Progress Notes (Addendum)
Patients bed alarm alarming.  Nurse entered room and found pt sitting on floor close to bed, leaning over with his oxygen pulled off and tele cords disconnected.  Asked patient if he was ok, assessed for any injuries.  Pt shook head from side to side when asked if he fell and if he was hurt.  Assisted patient back to bed.  Replacing his oxygen canula and non rebreather mask. Reorganized cords and placed pule ox probe back on patients finger.  Oxygen saturation was in the low 60's.  Stayed with patient while he recovered.  Patient was able to return to pule ox readings in the 90's within a short amount of time.  Ensure bed was still in lowest position, two rails up at head of bed and personal items within reach.  Patient did not appear to be in any distress and o2 sats were in mid 90s.  Explained to patient he needed to use the call bell py pressing the red button. Physically handed call bell to patient.  Bed alarm reactivated.    After leaving room, charge nurses were notified of findings.  Tele notified to ensure patients oxygen was not desaturating.     Family was not notified due to time of night.  Will pass along to day shift care team so they can inform family during daily update.

## 2019-08-31 NOTE — Plan of Care (Signed)
Plan of Care reviewed. 

## 2019-08-31 NOTE — Progress Notes (Signed)
Sitter placed at bedside for safety.

## 2019-08-31 NOTE — Progress Notes (Signed)
Inpatient Diabetes Program Recommendations  AACE/ADA: New Consensus Statement on Inpatient Glycemic Control (2015)  Target Ranges:  Prepandial:   less than 140 mg/dL      Peak postprandial:   less than 180 mg/dL (1-2 hours)      Critically ill patients:  140 - 180 mg/dL   Lab Results  Component Value Date   GLUCAP 352 (H) 08/31/2019   HGBA1C 9.4 (H) 08/11/2019    Review of Glycemic Control Results for Aaron Blair, Aaron Blair (MRN [id number]) as of 08/30/2019 11:09  Ref. Range 08/29/2019 07:59 08/29/2019 11:41 08/29/2019 16:24 08/29/2019 20:30 08/30/2019 04:37 08/30/2019 07:55  Glucose-Capillary Latest Ref Range: 70 - 99 mg/dL 710 (H) 626 (H) 948 (H) 252 (H) 236 (H) 269 (H)  Results for Aaron Blair, Aaron Blair (MRN [id number]) as of 08/31/2019 11:19  Ref. Range 08/30/2019 07:55 08/30/2019 12:17 08/30/2019 17:00 08/30/2019 21:15 08/30/2019 22:05 08/31/2019 01:54  Glucose-Capillary Latest Ref Range: 70 - 99 mg/dL 093 (H) 818 (H) 299 (H) 47 (L) 173 (H) 352 (H)   Diabetes history: DM 2 Outpatient Diabetes medications: Amaryl 4 mg Daily, Metformin 1000 gm Daily at bedtime, Actos 45 mg Daily Current orders for Inpatient glycemic control:  Levemir 10 units Daily Novolog 0-20 units tid + hs Novolog 10 units tid meal coverage  Solumedrol 40 mg Daily BUN/Creat: 48/0.95  Inpatient Diabetes Program Recommendations:    PT had hypoglycemia on Levemir 10 units bid, however glucose trends elevated still with reduction to 10 units Daily.  Renal function slowly increasing.  Increase Levemir to 15 units. Give additional 5 units today.  Increase Novolog meal coverage to 14 units.  Thanks,  Christena Deem RN, MSN, BC-ADM Inpatient Diabetes Coordinator Team Pager [phone] (8a-5p)

## 2019-08-31 NOTE — Progress Notes (Signed)
Nursing rounds completed. Bed alarm sounding. Patient found attempting to get out of bed without assistance to bedside commode. Staff nurse assist patient to bedside commode. Non rebreather mask askew on patient face. Telemetry cords, oxygen cords found jumbled in bed and on patient  Telemetry cords and oxygen tubing reorganized. Patient assisted back to bed. Side rails up time 2. Bed in lowest position and locked. Call light in reach. Bed alarm re set.

## 2019-08-31 NOTE — Progress Notes (Addendum)
PROGRESS NOTE    Aaron Blair  UYQ:[id number] DOB: 1934-07-10 DOA: 08/07/2019 PCP: No primary care provider on file.    Brief Narrative:  86 year old male who presented with dyspnea. He does have significant past medical history for type 2 diabetes mellitus, hypertension and dyslipidemia. Patient was transferred from [facility] for management acute hypoxic failure due to SARS COVID-19 viral pneumonia. Patient reported 7 days of fevers, chills, poor appetite, dyspnea and lethargy. On his initial physical examination his oximetry was in the 50s. Blood pressure 150/89, pulse rate 87, temperature 98.2, respiratory 22, oxygen saturation 93% on supplemental oxygen. His lungs had diffuse rales bilaterally, heart S1-S2 present rhythmic, abdomen soft, no lower extremity edema. SARS COVID-19 positive.His chest radiograph had bilateral interstitial infiltrates, base of the right upper lobe and left lower lobe.  Patient was admitted to the hospital working diagnosis of acute hypoxic respiratory failure due to SARS COVID-19 viral pneumonia.  Patient with persistent hypoxic respiratory failure, he has been treated with steroids, remdesivir, Actemra x2 and IV furosemide. CT chest was negative for pulmonary embolism.  Very slow to response, has COPD at baseline with emphysematous changes on CT chest. Follow upchest film(01/14)personally reviewed noted improvement on bilateral infiltrates,plan tocontinue to titrate down supplemental 02 to target 02 saturation greater than 88%.  Persistent high oxygen requirements, follow up chest radiograph from 08/30/19, with no worsening infiltrates but patient continue to have very high oxygen requirements with HFNC plus NRBM, 15 L/ min each.   Has developed AKI, with hyperkalemia.    Assessment & Plan:   Principal Problem:   Acute hypoxemic respiratory failure due to severe acute respiratory syndrome coronavirus 2 (SARS-CoV-2) disease (HCC) Active Problems:   Chronic diastolic CHF (congestive heart failure) (HCC)   Hypertension   Hyperlipidemia   Demand ischemia (HCC)   Pneumonia due to COVID-19 virus   Uncontrolled type 2 diabetes mellitus with complication (HCC)   Elevated troponin   Essential hypertension    1.Acute hypoxic respiratory failure due to SARS COVID-19 viral pneumonia/ significant lung injury due to SARS COVID 19. SP Remdesivir #5/5.Tocilizumab 12/30 and 01/7.  RR: 15  Pulse oxymetry: 96%  Fi02: 15 L/ min HFNC and NRBM  COVID-19 Labs  Recent Labs    08/29/19 0150 08/30/19 0407 08/31/19 0210  DDIMER 2.25* 1.32* 1.10*  FERRITIN 318  --   --   CRP 0.5  --   --     No results found for: SARSCOV2NAA    D dimer continue trending down, but his oxygen requirements continue to be high.   Continue slow taper of prednisone, change to 20 mg daily in am. Will continue with bronchodilator therapy, antitussive agentsand airway clearing techniques with flutter valve and incentive spirometer.  Patient very weak and deconditioned, out of bed to chair with physical therapy.   Patient continue to be at a  very high risk for worsening respiratory failure.  2. COPD/ emphysemawith acute exacerbation.On systemic inhaled steroids, and bronchodilators.  3.Acute on chronic diastolic CHF/ HTN.spdiuresis for volume overload, On amodipine for blood pressure control, holding lisinopril due to AKI and hyperkalemia.  4. Hypokalemia/ hyperkalemia with new AKI. His K has been elevated up 6 over last 24 H, despite aggressive therapy with IV insulin and sodium zirconium. Worsening renal function up 1,23 serum cr. Noted large ecchymosis on his right upper extremity, hemolysis into the ecchymotic region can explain high K. Check h&H in am.  Will continue medical therapy with sodium zirconium, he received IV insulin this  am, follow bmp this pm.   5. Uncontrolled T2DM(Hgb A1c 9,4)with steroid induced hyperglycemia/  hypoglycemia. Fasting glucose is 339. On insulin sliding scale for glucose cover and monitoring, pre-meal coverage (10 units tid)andbasal insulin 10 units daily.   DVT prophylaxis:enoxaparin high dose Code Status:dnr Family Communication:I spoke over the phone with the patient's son about patient's  condition, plan of care, prognosis and all questions were addressed.  His son would like to be able to be with his father, he was hoping to transfer him to [facility] after day 21 of initial positive test.    Disposition Plan/ discharge barriers:pending improvement in oxygenation.   Subjective: Patient continue  To have high oxygen requirements, denies dyspnea, or chest pain. Positive deconditioning. No nausea or vomiting. Large ecchymosis on right forearm.   Objective: Vitals:   08/30/19 1804 08/30/19 1935 08/31/19 0400 08/31/19 0500  BP:  (!) 118/53 (!) 107/59   Pulse: (!) 114 98 92   Resp: 20 18 15    Temp:  98.6 F (37 C) 98.4 F (36.9 C)   TempSrc:  Oral Oral   SpO2: 91% 93% 96%   Weight:    66.2 kg  Height:        Intake/Output Summary (Last 24 hours) at 08/31/2019 0845 Last data filed at 08/31/2019 0400 Gross per 24 hour  Intake 600 ml  Output 575 ml  Net 25 ml   Filed Weights   08/27/19 0500 08/30/19 0438 08/31/19 0500  Weight: 62 kg 65 kg 66.2 kg    Examination:   General: deconditioned, positive dyspnea at rest.  Neurology: Awake and alert, non focal  E ENT: mild pallor, no icterus, oral mucosa moist Cardiovascular: No JVD. S1-S2 present, rhythmic. No lower extremity edema. Pulmonary: positive breath sounds bilaterally. Gastrointestinal. Abdomen with no organomegaly, non tender, no rebound or guarding Skin. No rashes Musculoskeletal: no joint deformities     Data Reviewed: I have personally reviewed following labs and imaging studies  CBC: Recent Labs  Lab 08/25/19 0425  WBC 12.2*  NEUTROABS 11.6*  HGB 10.5*  HCT 30.7*    MCV 98.1  PLT 354*   Basic Metabolic Panel: Recent Labs  Lab 08/25/19 0425 08/26/19 0206 08/29/19 0150 08/30/19 0407 08/30/19 1645 08/30/19 2115 08/31/19 0210  NA 136   < > 137 132* 133* 134* 131*  K 3.8   < > 4.8 5.9* 6.3* 5.9* 6.0*  CL 99   < > 99 95* 97* 97* 94*  CO2 27   < > 29 28 27 26 23   GLUCOSE 235*   < > 58* 230* 192* 44* 339*  BUN 37*   < > 48* 48* 60* 55* 53*  CREATININE 0.93   < > 0.76 0.95 1.35* 1.10 1.23  CALCIUM 8.2*   < > 9.1 8.9 9.6 9.9 9.1  MG 2.1  --   --   --   --   --   --   PHOS 3.8  --   --   --   --   --   --    < > = values in this interval not displayed.   GFR: Estimated Creatinine Clearance: 36.6 mL/min (by C-G formula based on SCr of 1.23 mg/dL). Liver Function Tests: Recent Labs  Lab 08/27/19 0815 08/28/19 0358 08/28/19 1735 08/29/19 0150 08/30/19 1645  AST 25 17 25 21 22   ALT 30 28 35 40 37  ALKPHOS 52 53 62 60 60  BILITOT 0.8 0.6 0.7  0.8 0.7  PROT 4.8* 4.6* 4.9* 5.7* 5.2*  ALBUMIN 2.5* 2.5* 2.6* 3.0* 2.8*   No results for input(s): LIPASE, AMYLASE in the last 168 hours. No results for input(s): AMMONIA in the last 168 hours. Coagulation Profile: No results for input(s): INR, PROTIME in the last 168 hours. Cardiac Enzymes: No results for input(s): CKTOTAL, CKMB, CKMBINDEX, TROPONINI in the last 168 hours. BNP (last 3 results) No results for input(s): PROBNP in the last 8760 hours. HbA1C: No results for input(s): HGBA1C in the last 72 hours. CBG: Recent Labs  Lab 08/30/19 1217 08/30/19 1700 08/30/19 2115 08/30/19 2205 08/31/19 0154  GLUCAP 191* 307* 47* 173* 352*   Lipid Profile: No results for input(s): CHOL, HDL, LDLCALC, TRIG, CHOLHDL, LDLDIRECT in the last 72 hours. Thyroid Function Tests: No results for input(s): TSH, T4TOTAL, FREET4, T3FREE, THYROIDAB in the last 72 hours. Anemia Panel: Recent Labs    08/29/19 0150  FERRITIN 318      Radiology Studies: I have reviewed all of the imaging during this  hospital visit personally     Scheduled Meds: . amLODipine  5 mg Oral Daily  . vitamin C  500 mg Oral Daily  . aspirin EC  81 mg Oral Daily  . Chlorhexidine Gluconate Cloth  6 each Topical Daily  . chlorpheniramine-HYDROcodone  5 mL Oral Q12H  . docusate sodium  100 mg Oral Daily  . enoxaparin (LOVENOX) injection  40 mg Subcutaneous Q12H  . insulin aspart  0-20 Units Subcutaneous TID WC  . insulin aspart  0-5 Units Subcutaneous QHS  . insulin aspart  10 Units Subcutaneous TID WC  . insulin detemir  10 Units Subcutaneous Daily  . Ipratropium-Albuterol  2 puff Inhalation QID  . methylPREDNISolone (SOLU-MEDROL) injection  40 mg Intravenous Daily  . mometasone-formoterol  2 puff Inhalation BID  . mupirocin ointment   Nasal BID  . sodium zirconium cyclosilicate  10 g Oral BID  . zinc sulfate  220 mg Oral Daily   Continuous Infusions: . sodium chloride Stopped (08/26/2019 2259)  . sodium chloride       LOS: 21 days        Taresa Montville Annett Gula, MD

## 2019-08-31 NOTE — Progress Notes (Signed)
Patients blood sugar was 46 during nightly check.  Activated hypoglycemia protocol.  Pt was given 2 tubes of oral glucose.  Recheck was completed and patients blood sugar at that time was in the 170 range.    Stat serum CBG was ordered as part of protocol.  Resulted at 44.    Provider was notified of order activation and follow up results.  No additional orders at this time.

## 2019-08-31 NOTE — Progress Notes (Signed)
Occupational Therapy Treatment Patient Details Name: Aaron Blair MRN: [id number] DOB: 1934/02/28 Today's Date: 08/31/2019    History of present illness 86 year old male who presented with dyspnea.  He does have significant past medical history for type 2 diabetes mellitus, hypertension and dyslipidemia.  Patient was transferred from [facility] for management acute hypoxic failure due to SARS COVID-19 viral pneumonia.  Patient reported 7 days of fevers, chills, poor appetite, dyspnea and lethargy   OT comments  Pt making slow progress in therapy, continuing to require 15L HFNC + 15L NRB. Utilized Optometrist for session. Pt tolerated sitting edge of bed ~5+ min with supervision. Educated pt on safety strategies and fall prevention techniques with fair understanding. Pt continually takes NRB off to hear/speak with translator with SpO2 decreasing to low 70s. Continued education with pt on importance of keeping NRB donned. Pt engaged in standing tolerance activity to increase strength, endurance, and balance required for self-care and functional transfer tasks. Pt tolerated standing 1 x 1 min, 1 x 1 min 15 sec, and 1 x 1 min 45 sec. SpO2 dropped to mid 60s following each stand. Pt required ~6-7 min seated rest break with pt reporting min to mod shortness of breath throughout. Educated pt on breathing strategies/exercises with fair understanding and follow through. Pt required increased time to complete all tasks. Sitter present at end of session. Pt continues to demonstrate decreased strength, endurance, balance, standing tolerance, activity tolerance, and safety awareness impacting ability to complete self-care and functional transfer tasks. Recommend continued skilled OT services to address above deficits in order to promote function and prevent further decline.   Follow Up Recommendations  Supervision/Assistance - 24 hour;Home health OT(Pay may require SNF depending on progress)    Equipment Recommendations  3 in 1 bedside commode(for use in shower)    Recommendations for Other Services      Precautions / Restrictions Precautions Precautions: Fall;Other (comment) Precaution Comments: 15L HFNC + 15L NRB, desats quickly Restrictions Weight Bearing Restrictions: No       Mobility Bed Mobility Overal bed mobility: Needs Assistance Bed Mobility: Sit to Supine       Sit to supine: Supervision;HOB elevated   General bed mobility comments: Use of bedrail, cues for safety  Transfers Overall transfer level: Needs assistance Equipment used: 1 person hand held assist;Rolling walker (2 wheeled) Transfers: Sit to/from [facility] Sit to Stand: Min guard Stand pivot transfers: Min assist       General transfer comment: Min hand held assist for stand pivot without AD. Sit/stand from recliner min guard with RW.    Balance Overall balance assessment: Needs assistance Sitting-balance support: Feet supported;Bilateral upper extremity supported Sitting balance-Leahy Scale: Fair       Standing balance-Leahy Scale: Poor                             ADL either performed or assessed with clinical judgement   ADL Overall ADL's : Needs assistance/impaired     Grooming: Wash/dry face;Wash/dry hands;Set up;Supervision/safety;Sitting                                       Vision       Perception     Praxis      Cognition Arousal/Alertness: Awake/alert Behavior During Therapy: WFL for tasks assessed/performed Overall Cognitive Status: No family/caregiver present to determine baseline cognitive  functioning                                 General Comments: Poor insight into deficits as pt feels he is ready to return home. Utilized translator [phone]        Exercises     Shoulder Instructions       General Comments SpO2 90% on 15L NRB + 15L HFNC at rest. SpO2 dropped to mid 60s with activity. Pt required 6-7 min seated  recovery, reporting min to mod SOB throughout.     Pertinent Vitals/ Pain       Pain Assessment: No/denies pain  Home Living                                          Prior Functioning/Environment              Frequency           Progress Toward Goals  OT Goals(current goals can now be found in the care plan section)  Progress towards OT goals: Progressing toward goals  ADL Goals Pt Will Perform Grooming: with set-up;with supervision;sitting Pt Will Transfer to Toilet: with set-up;with supervision;bedside commode;ambulating Pt Will Perform Toileting - Clothing Manipulation and hygiene: with set-up;with supervision;sit to/from stand;sitting/lateral leans Pt/caregiver will Perform Home Exercise Program: Increased ROM;Increased strength;Both right and left upper extremity;With written HEP provided;With Supervision;With theraband Additional ADL Goal #1: Pt will perform simple ADL using energy conservation techniques with Min cues to maintain SpO2 >85%  Plan Discharge plan remains appropriate    Co-evaluation                 AM-PAC OT "6 Clicks" Daily Activity     Outcome Measure   Help from another person eating meals?: None Help from another person taking care of personal grooming?: A Little Help from another person toileting, which includes using toliet, bedpan, or urinal?: A Little Help from another person bathing (including washing, rinsing, drying)?: A Little Help from another person to put on and taking off regular upper body clothing?: A Little Help from another person to put on and taking off regular lower body clothing?: A Little 6 Click Score: 19    End of Session Equipment Utilized During Treatment: Oxygen;Rolling walker      Activity Tolerance Patient limited by fatigue(Limited by SOB)   Patient Left in chair;with call bell/phone within reach;with nursing/sitter in room   Nurse Communication Mobility status        Time:  1517-6160 OT Time Calculation (min): 56 min  Charges: OT General Charges $OT Visit: 1 Visit OT Treatments $Therapeutic Activity: 38-52 mins $Therapeutic Exercise: 8-22 mins  Peterson Ao OTR/L [phone]   Peterson Ao 08/31/2019, 10:09 AM

## 2019-08-31 NOTE — Progress Notes (Signed)
Potassium 6.0 this morning.  Notified provider.  Orders entered.

## 2019-08-31 NOTE — Progress Notes (Signed)
PT Cancellation Note  Patient Details Name: Aaron Blair MRN: [id number] DOB: April 03, 1934   Cancelled Treatment:    Reason Eval/Treat Not Completed: Fatigue/lethargy limiting ability to participate. Pt fatigued after OT earlier. Shaking his head and waving away the Ipad interpreter. Will continue to follow.   Angelina Ok [facility] 08/31/2019, 2:46 PM Skip Mayer PT Acute Rehabilitation Services Pager [phone] Office [phone]

## 2019-09-01 DIAGNOSIS — J189 Pneumonia, unspecified organism: Secondary | ICD-10-CM

## 2019-09-01 LAB — CBC WITH DIFFERENTIAL/PLATELET
Abs Immature Granulocytes: 0.18 10*3/uL — ABNORMAL HIGH (ref 0.00–0.07)
Basophils Absolute: 0 10*3/uL (ref 0.0–0.1)
Basophils Relative: 0 %
Eosinophils Absolute: 0 10*3/uL (ref 0.0–0.5)
Eosinophils Relative: 0 %
HCT: 30.8 % — ABNORMAL LOW (ref 39.0–52.0)
Hemoglobin: 10.5 g/dL — ABNORMAL LOW (ref 13.0–17.0)
Immature Granulocytes: 1 %
Lymphocytes Relative: 2 %
Lymphs Abs: 0.3 10*3/uL — ABNORMAL LOW (ref 0.7–4.0)
MCH: 34.3 pg — ABNORMAL HIGH (ref 26.0–34.0)
MCHC: 34.1 g/dL (ref 30.0–36.0)
MCV: 100.7 fL — ABNORMAL HIGH (ref 80.0–100.0)
Monocytes Absolute: 0.1 10*3/uL (ref 0.1–1.0)
Monocytes Relative: 1 %
Neutro Abs: 12.3 10*3/uL — ABNORMAL HIGH (ref 1.7–7.7)
Neutrophils Relative %: 96 %
Platelets: 143 10*3/uL — ABNORMAL LOW (ref 150–400)
RBC: 3.06 MIL/uL — ABNORMAL LOW (ref 4.22–5.81)
RDW: 14.7 % (ref 11.5–15.5)
WBC: 13 10*3/uL — ABNORMAL HIGH (ref 4.0–10.5)
nRBC: 0 % (ref 0.0–0.2)

## 2019-09-01 LAB — COMPREHENSIVE METABOLIC PANEL
ALT: 29 U/L (ref 0–44)
AST: 19 U/L (ref 15–41)
Albumin: 2.5 g/dL — ABNORMAL LOW (ref 3.5–5.0)
Alkaline Phosphatase: 60 U/L (ref 38–126)
Anion gap: 10 (ref 5–15)
BUN: 32 mg/dL — ABNORMAL HIGH (ref 8–23)
CO2: 29 mmol/L (ref 22–32)
Calcium: 8.5 mg/dL — ABNORMAL LOW (ref 8.9–10.3)
Chloride: 96 mmol/L — ABNORMAL LOW (ref 98–111)
Creatinine, Ser: 0.7 mg/dL (ref 0.61–1.24)
GFR calc Af Amer: >60 mL/min (ref >60)
GFR calc non Af Amer: >60 mL/min (ref >60)
Glucose, Bld: 216 mg/dL — ABNORMAL HIGH (ref 70–99)
Potassium: 4.8 mmol/L (ref 3.5–5.1)
Sodium: 135 mmol/L (ref 135–145)
Total Bilirubin: 1.3 mg/dL — ABNORMAL HIGH (ref 0.3–1.2)
Total Protein: 4.7 g/dL — ABNORMAL LOW (ref 6.5–8.1)

## 2019-09-01 LAB — GLUCOSE, CAPILLARY
Glucose-Capillary: 157 mg/dL — ABNORMAL HIGH (ref 70–99)
Glucose-Capillary: 64 mg/dL — ABNORMAL LOW (ref 70–99)
Glucose-Capillary: 157 mg/dL — ABNORMAL HIGH (ref 70–99)
Glucose-Capillary: 193 mg/dL — ABNORMAL HIGH (ref 70–99)

## 2019-09-01 LAB — D-DIMER, QUANTITATIVE: D-Dimer, Quant: 0.7 ug/mL-FEU — ABNORMAL HIGH (ref 0.00–0.50)

## 2019-09-01 LAB — FERRITIN: Ferritin: 344 ng/mL — ABNORMAL HIGH (ref 24–336)

## 2019-09-01 LAB — C-REACTIVE PROTEIN: CRP: 2.2 mg/dL — ABNORMAL HIGH (ref <1.0)

## 2019-09-01 LAB — MAGNESIUM: Magnesium: 2.1 mg/dL (ref 1.7–2.4)

## 2019-09-01 MED ORDER — SALINE SPRAY 0.65 % NA SOLN
1.0000 | NASAL | Status: DC | PRN
  Administered 2019-09-01 – 2019-09-02 (×2): 1 via NASAL
  Filled 2019-09-01: qty 44

## 2019-09-01 MED ORDER — SODIUM ZIRCONIUM CYCLOSILICATE 10 G PO PACK
10.0000 g | PACK | Freq: Two times a day (BID) | ORAL | Status: AC
  Administered 2019-09-01 (×2): 10 g via ORAL
  Filled 2019-09-01 (×2): qty 1

## 2019-09-01 NOTE — Progress Notes (Signed)
Physical Therapy Treatment Patient Details Name: Aaron Blair MRN: [id number] DOB: 12/16/1933 Today's Date: 09/01/2019    History of Present Illness 86 year old male who presented with dyspnea.  He does have significant past medical history for type 2 diabetes mellitus, hypertension and dyslipidemia.  Patient was transferred from [facility] for management acute hypoxic failure due to SARS COVID-19 viral pneumonia.  Patient reported 7 days of fevers, chills, poor appetite, dyspnea and lethargy    PT Comments    Patient received in recliner, sitter present. Patient restless, impulsive, follows some gestures. Patient stood x 1 witn min assist. Patient on 15 L HFNC and 15L NRB with sats resting( CNA said that sats finally came up into 90's) Afoter patient moved around SPO2 dropping into low 80's(ear probe). Patient's progress slow due respiratory status.   Follow Up Recommendations  Home health PT;Supervision/Assistance - 24 hour     Equipment Recommendations  Wheelchair (measurements PT)    Recommendations for Other Services       Precautions / Restrictions Precautions Precautions: Fall;Other (comment) Precaution Comments: 15L HFNC + 15L NRB, desats quickly    Mobility  Bed Mobility               General bed mobility comments: in recliner.  Transfers Overall transfer level: Needs assistance Equipment used: 1 person hand held assist Transfers: Sit to/from Stand Sit to Stand: Min guard         General transfer comment: patient impulsive and puts foot rest down and tries to stand, has all lines in his hands, Tactile cues for safety.  Ambulation/Gait                 Stairs             Wheelchair Mobility    Modified Rankin (Stroke Patients Only)       Balance   Sitting-balance support: Feet supported;Bilateral upper extremity supported Sitting balance-Leahy Scale: Fair     Standing balance support: During functional activity;Single extremity  supported Standing balance-Leahy Scale: Poor Standing balance comment: Reliant on UE support                            Cognition Arousal/Alertness: Awake/alert;Lethargic Behavior During Therapy: Impulsive Overall Cognitive Status: No family/caregiver present to determine baseline cognitive functioning                                 General Comments: patient in and out of alertnes, will arouse and  jump up, take off mask. Did not use interpreter as patient not following consistenly. Follows 1 step gestures, has a Actuary.      Exercises Other Exercises Other Exercises: attempted theraband, pt, performed 2    General Comments        Pertinent Vitals/Pain Pain Assessment: No/denies pain    Home Living                      Prior Function            PT Goals (current goals can now be found in the care plan section) Progress towards PT goals: Not progressing toward goals - comment(due to respiratoy status)    Frequency    Min 3X/week      PT Plan Current plan remains appropriate    Co-evaluation  AM-PAC PT "6 Clicks" Mobility   Outcome Measure  Help needed turning from your back to your side while in a flat bed without using bedrails?: A Little Help needed moving from lying on your back to sitting on the side of a flat bed without using bedrails?: None Help needed moving to and from a bed to a chair (including a wheelchair)?: A Lot Help needed standing up from a chair using your arms (e.g., wheelchair or bedside chair)?: A Lot Help needed to walk in hospital room?: Total Help needed climbing 3-5 steps with a railing? : Total 6 Click Score: 13    End of Session Equipment Utilized During Treatment: Oxygen Activity Tolerance: Patient limited by lethargy;Treatment limited secondary to medical complications (Comment) Patient left: in bed;with call bell/phone within reach;in chair;with nursing/sitter in room Nurse  Communication: Mobility status       Time: 7583-0746 PT Time Calculation (min) (ACUTE ONLY): 16 min  Charges:  $Therapeutic Activity: 8-22 mins                     Blanchard Kelch PT Acute Rehabilitation Services Pager [phone] Office [phone]    Rada Hay 09/01/2019, 1:38 PM

## 2019-09-01 NOTE — Progress Notes (Signed)
PROGRESS NOTE    Aaron Blair  PIR:[id number] DOB: 1934/04/17 DOA: 08/06/2019 PCP: No primary care provider on file.   Brief Narrative:  ([place] speaker) 86 y.o.  Asian male, PMHx  diabetes type II uncontrolled with complication,  HTN, HLD   Presents as a transfer from [facility] for management of respiratory failure with hypoxia.  Patient does not speak [place], history obtained from family over the phone.  Over the past week patient has had subjective fevers with chills, poor appetite and started having shortness of breath with lethargy today prompting the ER presentation.  In the ER he was found to have oxygen saturation in the 50s on room air, CXR consistent with viral pneumonia, SARS-CoV-2 positive.  Patient transferred for further management.  No reported nausea, vomiting, diarrhea.  He has intermittent cough which is nonproductive.  No reported chest pain, palpitations, orthopnea, PND, lower extremity edema.    Subjective: 1/20 afebrile overnight positive increased S OB while on HFNC+ NRB.  RT called to bedside to evaluate for HHFNC.  After patient's nares clean SPO2 increased to~90% in supine position.  Via translator patient stated negative CP, negative abdominal pain.    Assessment & Plan:   Principal Problem:   Acute hypoxemic respiratory failure due to severe acute respiratory syndrome coronavirus 2 (SARS-CoV-2) disease (HCC) Active Problems:   Chronic diastolic CHF (congestive heart failure) (HCC)   Hypertension   Hyperlipidemia   Demand ischemia (HCC)   Pneumonia due to COVID-19 virus   Uncontrolled type 2 diabetes mellitus with complication (HCC)   Elevated troponin   Essential hypertension  Covid pneumonia/acute respiratory failure with hypoxia COVID-19 Labs Recent Labs    08/30/19 0407 08/31/19 0210  DDIMER 1.32* 1.10*    12/29 SARS coronavirus positive  -Decadron 6 mg Daily completed -Remdesivir completed -Combivent QID  -vitamins per Covid  protocol -Flutter valve -Incentive spirometry -12/30 Actemra x1 dose -1/2 Lasix IV 60 mg x 2 dose -1/7 Actemra times second dose -Prone 16 hours/day, if patient cannot tolerate it prone 2 to 3 hours per shift -Titrate O2 to maintain SPO2> 88% -1/12 Albumin 25 g+ Lasix IV 60 mg  Emphysema panlobular -See Covid -Patient with 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 -Decadron low-dose ineffective, will attempt high-dose Solu-Medrol 80 mg TID short course (4 days)  Diabetes type 2 uncontrolled with complication -10/20 hemoglobin A1c= 9.8 -1/1 Endo tool until patient's CBG back under control -Levemir 10 units daily -NovoLog 10 units qac -resistant SSI  Elevated D-dimer -CTA PE protocol negative see results below -Bilateral lower extremity Doppler negative DVT -1/7 D-dimer increasing with increasing S OB. Repeat CTA PE protocol negative PE, see results below  Chest pain/elevated troponin/demand ischemia -EKG pending -Eechocardiogram; not consistent with ACS/MI see results below  Essential HTN -Amlodipine 5 mg daily  Chronic diastolic CHF -See HTN -Strict in -190.22ml -Daily weight  Hypokalemia/Hyperkalemia -Potassium goal> 4 -1/20 Lokelma 10 g x 2 dose   Goals of care -1/12 NCM/Palliative Care Consult; son has explained that mother and patient having extremely difficult time with this disease secondary to multiple issues, language barrier, food difference, inability to have physical contact.  Question once patient is considered noninfective (1/19), is there an acute care facility or ward within [facility] Which would allow visitation?    DVT prophylaxis: Lovenox Code Status: DNR Family Communication: 08/24/2019 spoke with Emelda Fear (son) explained plan of care answered all questions Disposition Plan: TBD   Consultants:    Procedures/Significant Events:  12/30 Echocardiogram  Left Ventricle: EV= 65 to 70%. - moderately increased LVH Aortic  Valve: Severely calcified AoV with focal calcification of the NCC. Restricted movement of the [facility] 12/30 CTA chest PE protocol;.-Extensive ground-glass opacities in keeping with history of COVID-19 positivity. 12/30 Actemra x1 dose  Motion degraded study with no evidence of pulmonary embolism. 1/2 bilateral lower extremity Doppler; negative DVT 1/3 PCXR; 1/7 Actemra times second dose 1/7 CTA PE protocol;-negative PE  -Mildly improved multifocal pneumonia related to COVID-19 infection. -Emphysema  1/10 PCXR;-extensive bilateral pulmonary infiltrates appear essentially unchanged since the prior CT scan.  -Emphysematous changesin the upper lobes    I have personally reviewed and interpreted all radiology studies and my findings are as above.  VENTILATOR SETTINGS: HFNC+NRB 1/20 Flow; 15 L/min SPO2 94%   Cultures   Antimicrobials: Anti-infectives (From admission, onward)   Start     Dose/Rate Stop   08/11/19 1000  remdesivir 100 mg in sodium chloride 0.9 % 100 mL IVPB     100 mg 200 mL/hr over 30 Minutes 08/15/19 0959   August 20, 2019 2230  remdesivir 200 mg in sodium chloride 0.9% 250 mL IVPB     200 mg 580 mL/hr over 30 Minutes Aug 20, 2019 2251   20-Aug-2019 1745  cefTRIAXone (ROCEPHIN) 1 g in sodium chloride 0.9 % 100 mL IVPB     1 g 200 mL/hr over 30 Minutes 08-20-19 1945   08/20/19 1745  azithromycin (ZITHROMAX) 500 mg in sodium chloride 0.9 % 250 mL IVPB     500 mg 250 mL/hr over 60 Minutes August 20, 2019 2057       Devices    LINES / TUBES:      Continuous Infusions: . sodium chloride Stopped (2019-08-20 2259)  . sodium chloride 50 mL/hr at 08/31/19 1600     Objective: Vitals:   09/01/19 0515 09/01/19 0520 09/01/19 0521 09/01/19 0721  BP:  121/67 121/67 123/65  Pulse: 95 94 97 93  Resp: 12 18 15 10   Temp:   98.7 F (37.1 C) 97.7 F (36.5 C)  TempSrc:   Oral Axillary  SpO2: 94% 96% 97% 94%  Weight:      Height:        Intake/Output Summary (Last 24 hours) at  09/01/2019 0840 Last data filed at 09/01/2019 0500 Gross per 24 hour  Intake 1466.47 ml  Output --  Net 1466.47 ml   Filed Weights   08/27/19 0500 08/30/19 0438 08/31/19 0500  Weight: 62 kg 65 kg 66.2 kg   Physical Exam:  General: A/O x4, positive acute respiratory distress Eyes: negative scleral hemorrhage, negative anisocoria, negative icterus ENT: Negative Runny nose, negative gingival bleeding, Neck:  Negative scars, masses, torticollis, lymphadenopathy, JVD Lungs: Clear to auscultation bilaterally without wheezes or crackles Cardiovascular: Regular rate and rhythm without murmur gallop or rub normal S1 and S2 Abdomen: negative abdominal pain, nondistended, positive soft, bowel sounds, no rebound, no ascites, no appreciable mass Extremities: No significant cyanosis, clubbing, or edema bilateral lower extremities Skin: Negative rashes, lesions, ulcers Psychiatric:  Negative depression, negative anxiety, negative fatigue, negative mania  Central nervous system:  Cranial nerves II through XII intact, tongue/uvula midline, all extremities muscle strength 5/5, sensation intact throughout, negative dysarthria, negative expressive aphasia, negative receptive aphasia.     Data Reviewed: Care during the described time interval was provided by me .  I have reviewed this patient's available data, including medical history, events of note, physical examination, and all test results as part of my evaluation.   CBC: No  results for input(s): WBC, NEUTROABS, HGB, HCT, MCV, PLT in the last 168 hours. Basic Metabolic Panel: Recent Labs  Lab 08/30/19 0407 08/30/19 1645 08/30/19 2115 08/31/19 0210 08/31/19 1907  NA 132* 133* 134* 131* 135  K 5.9* 6.3* 5.9* 6.0* 6.1*  CL 95* 97* 97* 94* 96*  CO2 28 27 26 23 29   GLUCOSE 230* 192* 44* 339* 238*  BUN 48* 60* 55* 53* 46*  CREATININE 0.95 1.35* 1.10 1.23 1.09  CALCIUM 8.9 9.6 9.9 9.1 9.0   GFR: Estimated Creatinine Clearance: 41.3 mL/min  (by C-G formula based on SCr of 1.09 mg/dL). Liver Function Tests: Recent Labs  Lab 08/27/19 0815 08/28/19 0358 08/28/19 1735 08/29/19 0150 08/30/19 1645  AST 25 17 25 21 22   ALT 30 28 35 40 37  ALKPHOS 52 53 62 60 60  BILITOT 0.8 0.6 0.7 0.8 0.7  PROT 4.8* 4.6* 4.9* 5.7* 5.2*  ALBUMIN 2.5* 2.5* 2.6* 3.0* 2.8*   No results for input(s): LIPASE, AMYLASE in the last 168 hours. No results for input(s): AMMONIA in the last 168 hours. Coagulation Profile: No results for input(s): INR, PROTIME in the last 168 hours. Cardiac Enzymes: No results for input(s): CKTOTAL, CKMB, CKMBINDEX, TROPONINI in the last 168 hours. BNP (last 3 results) No results for input(s): PROBNP in the last 8760 hours. HbA1C: No results for input(s): HGBA1C in the last 72 hours. CBG: Recent Labs  Lab 08/31/19 0154 08/31/19 1255 08/31/19 1641 08/31/19 2319 09/01/19 0717  GLUCAP 352* 150* 190* 239* 157*   Lipid Profile: No results for input(s): CHOL, HDL, LDLCALC, TRIG, CHOLHDL, LDLDIRECT in the last 72 hours. Thyroid Function Tests: No results for input(s): TSH, T4TOTAL, FREET4, T3FREE, THYROIDAB in the last 72 hours. Anemia Panel: No results for input(s): VITAMINB12, FOLATE, FERRITIN, TIBC, IRON, RETICCTPCT in the last 72 hours. Urine analysis:    Component Value Date/Time   COLORURINE YELLOW 06/01/2019 0853   APPEARANCEUR CLEAR 06/01/2019 0853   LABSPEC 1.020 06/01/2019 0853   PHURINE 6.5 06/01/2019 0853   GLUCOSEU 250 (A) 06/01/2019 0853   HGBUR NEGATIVE 06/01/2019 0853   BILIRUBINUR NEGATIVE 06/01/2019 0853   KETONESUR NEGATIVE 06/01/2019 0853   PROTEINUR 100 (A) 12/15/2016 1216   UROBILINOGEN 0.2 06/01/2019 0853   NITRITE NEGATIVE 06/01/2019 0853   LEUKOCYTESUR NEGATIVE 06/01/2019 0853   Sepsis Labs: @LABRCNTIP (procalcitonin:4,lacticidven:4)  ) Recent Results (from the past 240 hour(s))  Culture, blood (routine x 2)     Status: None   Collection Time: 08/22/19 11:08 AM   Specimen:  BLOOD  Result Value Ref Range Status   Specimen Description BLOOD RIGHT ARM  Final   Special Requests   Final    BOTTLES DRAWN AEROBIC ONLY Blood Culture adequate volume Performed at [facility], [address] [place], [place]    Culture NO GROWTH 7 DAYS  Final   Report Status 08/29/2019 FINAL  Final  Culture, blood (routine x 2)     Status: None   Collection Time: 08/22/19 11:15 AM   Specimen: BLOOD  Result Value Ref Range Status   Specimen Description BLOOD RIGHT HAND  Final   Special Requests   Final    BOTTLES DRAWN AEROBIC ONLY Blood Culture adequate volume Performed at [facility], [address] [place], [place]    Culture NO GROWTH 7 DAYS  Final   Report Status 08/29/2019 FINAL  Final  Culture, Urine     Status: None   Collection Time: 08/22/19  10:30 PM   Specimen: Urine, Random  Result Value Ref Range Status   Specimen Description   Final    URINE, RANDOM Performed at [facility], [address], [place], [place] [address]    Special Requests   Final    NONE Performed at [facility], [address] [place], [place] [address]    Culture   Final    NO GROWTH Performed at [facility], [address], [place], [place] [address]    Report Status 08/23/2019 FINAL  Final         Radiology Studies: No results found.      Scheduled Meds: . amLODipine  5 mg Oral Daily  . vitamin C  500 mg Oral Daily  . aspirin EC  81 mg Oral Daily  . Chlorhexidine Gluconate Cloth  6 each Topical Daily  . chlorpheniramine-HYDROcodone  5 mL Oral Q12H  . docusate sodium  100 mg Oral Daily  . enoxaparin (LOVENOX) injection  40 mg Subcutaneous Q12H  . insulin aspart  0-20 Units Subcutaneous TID WC  . insulin aspart  0-5 Units Subcutaneous QHS  . insulin aspart  10 Units Subcutaneous TID WC  . insulin detemir  10 Units Subcutaneous Daily  . Ipratropium-Albuterol  2 puff  Inhalation QID  . mometasone-formoterol  2 puff Inhalation BID  . mupirocin ointment   Nasal BID  . predniSONE  20 mg Oral Q breakfast  . zinc sulfate  220 mg Oral Daily   Continuous Infusions: . sodium chloride Stopped (08/14/19 2259)  . sodium chloride 50 mL/hr at 08/31/19 1600     LOS: 22 days   The patient is critically ill with multiple organ systems failure and requires high complexity decision making for assessment and support, frequent evaluation and titration of therapies, application of advanced monitoring technologies and extensive interpretation of multiple databases. Critical Care Time devoted to patient care services described in this note  Time spent: 40 minutes     Elaya Droege, Roselind Messier, MD [facility] Pager [phone]  If 7PM-7AM, please contact night-coverage [email or web address] Password [email or web address] 09/01/2019, 8:40 AM

## 2019-09-01 NOTE — Plan of Care (Signed)
Plan of Care reviewed. 

## 2019-09-02 LAB — COMPREHENSIVE METABOLIC PANEL
ALT: 30 U/L (ref 0–44)
AST: 21 U/L (ref 15–41)
Albumin: 2.7 g/dL — ABNORMAL LOW (ref 3.5–5.0)
Alkaline Phosphatase: 72 U/L (ref 38–126)
Anion gap: 12 (ref 5–15)
BUN: 28 mg/dL — ABNORMAL HIGH (ref 8–23)
CO2: 29 mmol/L (ref 22–32)
Calcium: 8.6 mg/dL — ABNORMAL LOW (ref 8.9–10.3)
Chloride: 96 mmol/L — ABNORMAL LOW (ref 98–111)
Creatinine, Ser: 0.69 mg/dL (ref 0.61–1.24)
GFR calc Af Amer: >60 mL/min (ref >60)
GFR calc non Af Amer: >60 mL/min (ref >60)
Glucose, Bld: 147 mg/dL — ABNORMAL HIGH (ref 70–99)
Potassium: 4.7 mmol/L (ref 3.5–5.1)
Sodium: 137 mmol/L (ref 135–145)
Total Bilirubin: 1.5 mg/dL — ABNORMAL HIGH (ref 0.3–1.2)
Total Protein: 5.2 g/dL — ABNORMAL LOW (ref 6.5–8.1)

## 2019-09-02 LAB — CBC WITH DIFFERENTIAL/PLATELET
Abs Immature Granulocytes: 0.24 10*3/uL — ABNORMAL HIGH (ref 0.00–0.07)
Basophils Absolute: 0 10*3/uL (ref 0.0–0.1)
Basophils Relative: 0 %
Eosinophils Absolute: 0.2 10*3/uL (ref 0.0–0.5)
Eosinophils Relative: 2 %
HCT: 33.5 % — ABNORMAL LOW (ref 39.0–52.0)
Hemoglobin: 11 g/dL — ABNORMAL LOW (ref 13.0–17.0)
Immature Granulocytes: 2 %
Lymphocytes Relative: 5 %
Lymphs Abs: 0.6 10*3/uL — ABNORMAL LOW (ref 0.7–4.0)
MCH: 32.4 pg (ref 26.0–34.0)
MCHC: 32.8 g/dL (ref 30.0–36.0)
MCV: 98.8 fL (ref 80.0–100.0)
Monocytes Absolute: 0.2 10*3/uL (ref 0.1–1.0)
Monocytes Relative: 2 %
Neutro Abs: 10.2 10*3/uL — ABNORMAL HIGH (ref 1.7–7.7)
Neutrophils Relative %: 89 %
Platelets: 165 10*3/uL (ref 150–400)
RBC: 3.39 MIL/uL — ABNORMAL LOW (ref 4.22–5.81)
RDW: 14.9 % (ref 11.5–15.5)
WBC: 11.4 10*3/uL — ABNORMAL HIGH (ref 4.0–10.5)
nRBC: 0 % (ref 0.0–0.2)

## 2019-09-02 LAB — GLUCOSE, CAPILLARY
Glucose-Capillary: 146 mg/dL — ABNORMAL HIGH (ref 70–99)
Glucose-Capillary: 212 mg/dL — ABNORMAL HIGH (ref 70–99)
Glucose-Capillary: 221 mg/dL — ABNORMAL HIGH (ref 70–99)
Glucose-Capillary: 97 mg/dL (ref 70–99)

## 2019-09-02 LAB — FERRITIN: Ferritin: 376 ng/mL — ABNORMAL HIGH (ref 24–336)

## 2019-09-02 LAB — D-DIMER, QUANTITATIVE: D-Dimer, Quant: 0.91 ug/mL-FEU — ABNORMAL HIGH (ref 0.00–0.50)

## 2019-09-02 LAB — C-REACTIVE PROTEIN: CRP: 4.3 mg/dL — ABNORMAL HIGH (ref <1.0)

## 2019-09-02 LAB — MAGNESIUM: Magnesium: 2.3 mg/dL (ref 1.7–2.4)

## 2019-09-02 LAB — PHOSPHORUS: Phosphorus: 3 mg/dL (ref 2.5–4.6)

## 2019-09-02 MED ORDER — INSULIN ASPART 100 UNIT/ML ~~LOC~~ SOLN
14.0000 [IU] | Freq: Three times a day (TID) | SUBCUTANEOUS | Status: DC
  Administered 2019-09-02 – 2019-09-03 (×2): 14 [IU] via SUBCUTANEOUS

## 2019-09-02 NOTE — Progress Notes (Signed)
Pt very anxious this AM.  Pt refusing lab draw, taking off NRB, SpO2 into 70s.  [place] interpreter initiated and explained to Pt importance of leaving O2 on.  Pt thought his son/wife were going to take him home today.  Explained that while going home was an option in the future, that his oxygen demand was too high to maintain at home.  Explained that this RN spoke with son last night and provided an update.  Son did not mention/ask about taking Pt home.  Explained to Pt that I would pass on asking that MD contact family and provide/explain hospital course and plan of care.  Pt agreeable to having labs drawn after taking through interpreter.

## 2019-09-02 NOTE — Plan of Care (Signed)
Plan of Care reviewed. 

## 2019-09-02 NOTE — Progress Notes (Signed)
Pt sats around 87%. 15L HFNC and 15L NRB. Desats very quickly with any slight movement and very slow to recover. Nares cleaned and clear. MD notified.

## 2019-09-02 NOTE — Progress Notes (Addendum)
PROGRESS NOTE    Aaron Blair  FBP:[id number] DOB: 11-17-1933 DOA: 09/06/2019 PCP: No primary care provider on file.   Brief Narrative:  ([place] speaker) 86 y.o.  Asian male, PMHx  diabetes type II uncontrolled with complication,  HTN, HLD   Presents as a transfer from [facility] for management of respiratory failure with hypoxia.  Patient does not speak [place], history obtained from family over the phone.  Over the past week patient has had subjective fevers with chills, poor appetite and started having shortness of breath with lethargy today prompting the ER presentation.  In the ER he was found to have oxygen saturation in the 50s on room air, CXR consistent with viral pneumonia, SARS-CoV-2 positive.  Patient transferred for further management.  No reported nausea, vomiting, diarrhea.  He has intermittent cough which is nonproductive.  No reported chest pain, palpitations, orthopnea, PND, lower extremity edema.    Subjective: 1/21 afebrile last 24 hours, patient states he does not want to talk to me today.  Positive S OB   Assessment & Plan:   Principal Problem:   Acute hypoxemic respiratory failure due to severe acute respiratory syndrome coronavirus 2 (SARS-CoV-2) disease (HCC) Active Problems:   Chronic diastolic CHF (congestive heart failure) (HCC)   Hypertension   Hyperlipidemia   Demand ischemia (HCC)   Pneumonia due to COVID-19 virus   Uncontrolled type 2 diabetes mellitus with complication (HCC)   Elevated troponin   Essential hypertension  Covid pneumonia/acute respiratory failure with hypoxia COVID-19 Labs  Recent Labs    08/31/19 0210 09/01/19 1000 09/02/19 0614  DDIMER 1.10* 0.70* 0.91*  FERRITIN  --  344* 376*  CRP  --  2.2* 4.3*    12/29 SARS coronavirus positive  -Decadron 6 mg Daily completed -Remdesivir completed -Prednisone 20 mg daily -Combivent QID  -vitamins per Covid protocol -Flutter valve -Incentive spirometry -12/30 Actemra x1 dose -1/2  Lasix IV 60 mg x 2 dose -1/7 Actemra times second dose -Prone 16 hours/day, if patient cannot tolerate it prone 2 to 3 hours per shift -Titrate O2 to maintain SPO2> 88%  Emphysema panlobular -See Covid -Patient with 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 -Completed high -dose Solu-Medrol 80 mg TID short course (4 days), now on prednisone 20 mg daily  Diabetes type 2 uncontrolled with complication -10/20 hemoglobin A1c= 9.8 -1/1 Endo tool until patient's CBG back under control -Levemir 10 units daily -1/21 increase NovoLog 14 units qac -resistant SSI  Elevated D-dimer -CTA PE protocol negative see results below -Bilateral lower extremity Doppler negative DVT -1/7 D-dimer increasing with increasing S OB. Repeat CTA PE protocol negative PE, see results below  Chest pain/elevated troponin/demand ischemia -EKG pending -Eechocardiogram; not consistent with ACS/MI see results below  Essential HTN -Amlodipine 5 mg daily  Chronic diastolic CHF -See HTN -Strict in +779.49ml -Daily weight Filed Weights   08/27/19 0500 08/30/19 0438 08/31/19 0500  Weight: 62 kg 65 kg 66.2 kg   Hypokalemia/Hyperkalemia -Potassium goal> 4 -1/20 Lokelma 10 g x 2 dose--> hyperkalemia resolved   Goals of care -1/12 NCM/Palliative Care Consult; son has explained that mother and patient having extremely difficult time with this disease secondary to multiple issues, language barrier, food difference, inability to have physical contact.  Question once patient is considered noninfective (1/19), is there an acute care facility or ward within [facility] Which would allow visitation?    DVT prophylaxis: Lovenox Code Status: DNR Family Communication: 1/21 spoke with Emelda Fear (son) explained plan of  care answered all questions Disposition Plan: TBD   Consultants:    Procedures/Significant Events:  12/30 Echocardiogram Left Ventricle: EV= 65 to 70%. - moderately increased  LVH Aortic Valve: Severely calcified AoV with focal calcification of the NCC. Restricted movement of the [facility] 12/30 CTA chest PE protocol;.-Extensive ground-glass opacities in keeping with history of COVID-19 positivity. 12/30 Actemra x1 dose  Motion degraded study with no evidence of pulmonary embolism. 1/2 bilateral lower extremity Doppler; negative DVT 1/3 PCXR; 1/7 Actemra times second dose 1/7 CTA PE protocol;-negative PE  -Mildly improved multifocal pneumonia related to COVID-19 infection. -Emphysema  1/10 PCXR;-extensive bilateral pulmonary infiltrates appear essentially unchanged since the prior CT scan.  -Emphysematous changesin the upper lobes    I have personally reviewed and interpreted all radiology studies and my findings are as above.  VENTILATOR SETTINGS: HFNC+NRB 1/21 Flow; 15 L/min SPO2 90%   Cultures 12/29 SARS coronavirus positive   Antimicrobials: Anti-infectives (From admission, onward)   Start     Dose/Rate Stop   08/11/19 1000  remdesivir 100 mg in sodium chloride 0.9 % 100 mL IVPB     100 mg 200 mL/hr over 30 Minutes 08/15/19 0959   07/28/2019 2230  remdesivir 200 mg in sodium chloride 0.9% 250 mL IVPB     200 mg 580 mL/hr over 30 Minutes 08/06/2019 2251   07/25/2019 1745  cefTRIAXone (ROCEPHIN) 1 g in sodium chloride 0.9 % 100 mL IVPB     1 g 200 mL/hr over 30 Minutes 07/29/2019 1945   07/31/2019 1745  azithromycin (ZITHROMAX) 500 mg in sodium chloride 0.9 % 250 mL IVPB     500 mg 250 mL/hr over 60 Minutes 07/15/2019 2057       Devices    LINES / TUBES:      Continuous Infusions: . sodium chloride Stopped (08/02/2019 2259)  . sodium chloride 50 mL/hr at 09/01/19 1600     Objective: Vitals:   09/01/19 2000 09/02/19 0027 09/02/19 0402 09/02/19 0730  BP:  129/63 138/65 (!) 149/88  Pulse: 92 99 98 100  Resp: 16 12 13 20   Temp:  98.3 F (36.8 C) 98.5 F (36.9 C) 98.2 F (36.8 C)  TempSrc:  Oral Oral Axillary  SpO2: 92% 96% 93% 90%   Weight:      Height:        Intake/Output Summary (Last 24 hours) at 09/02/2019 0947 Last data filed at 09/02/2019 0530 Gross per 24 hour  Intake 1650 ml  Output 350 ml  Net 1300 ml   Filed Weights   08/27/19 0500 08/30/19 0438 08/31/19 0500  Weight: 62 kg 65 kg 66.2 kg   Physical Exam:  General: A/O x4 (states does not want to talk to me today), positive acute respiratory distress Eyes: negative scleral hemorrhage, negative anisocoria, negative icterus ENT: Negative Runny nose, negative gingival bleeding, Neck:  Negative scars, masses, torticollis, lymphadenopathy, JVD Lungs: Clear to auscultation bilaterally, positive crackles LLL  Cardiovascular: Regular rate and rhythm without murmur gallop or rub normal S1 and S2 Abdomen: negative abdominal pain, nondistended, positive soft, bowel sounds, no rebound, no ascites, no appreciable mass Extremities: No significant cyanosis, clubbing, or edema bilateral lower extremities Skin: Negative rashes, lesions, ulcers Psychiatric: Refuses to talk to anyone today. Central nervous system:  Cranial nerves II through XII intact, tongue/uvula midline, all extremities muscle strength 5/5, sensation intact throughout, negative dysarthria, negative expressive aphasia, negative receptive aphasia.      Data Reviewed: Care during the described time interval was  provided by me .  I have reviewed this patient's available data, including medical history, events of note, physical examination, and all test results as part of my evaluation.   CBC: Recent Labs  Lab 09/01/19 1000 09/02/19 0614  WBC 13.0* 11.4*  NEUTROABS 12.3* 10.2*  HGB 10.5* 11.0*  HCT 30.8* 33.5*  MCV 100.7* 98.8  PLT 143* 165   Basic Metabolic Panel: Recent Labs  Lab 08/30/19 2115 08/31/19 0210 08/31/19 1907 09/01/19 1000 09/02/19 0614  NA 134* 131* 135 135 137  K 5.9* 6.0* 6.1* 4.8 4.7  CL 97* 94* 96* 96* 96*  CO2 26 23 29 29 29   GLUCOSE 44* 339* 238* 216* 147*   BUN 55* 53* 46* 32* 28*  CREATININE 1.10 1.23 1.09 0.70 0.69  CALCIUM 9.9 9.1 9.0 8.5* 8.6*  MG  --   --   --  2.1 2.3  PHOS  --   --   --   --  3.0   GFR: Estimated Creatinine Clearance: 56.3 mL/min (by C-G formula based on SCr of 0.69 mg/dL). Liver Function Tests: Recent Labs  Lab 08/28/19 1735 08/29/19 0150 08/30/19 1645 09/01/19 1000 09/02/19 0614  AST 25 21 22 19 21   ALT 35 40 37 29 30  ALKPHOS 62 60 60 60 72  BILITOT 0.7 0.8 0.7 1.3* 1.5*  PROT 4.9* 5.7* 5.2* 4.7* 5.2*  ALBUMIN 2.6* 3.0* 2.8* 2.5* 2.7*   No results for input(s): LIPASE, AMYLASE in the last 168 hours. No results for input(s): AMMONIA in the last 168 hours. Coagulation Profile: No results for input(s): INR, PROTIME in the last 168 hours. Cardiac Enzymes: No results for input(s): CKTOTAL, CKMB, CKMBINDEX, TROPONINI in the last 168 hours. BNP (last 3 results) No results for input(s): PROBNP in the last 8760 hours. HbA1C: No results for input(s): HGBA1C in the last 72 hours. CBG: Recent Labs  Lab 09/01/19 0717 09/01/19 1234 09/01/19 1651 09/01/19 2108 09/02/19 0725  GLUCAP 157* 64* 157* 193* 146*   Lipid Profile: No results for input(s): CHOL, HDL, LDLCALC, TRIG, CHOLHDL, LDLDIRECT in the last 72 hours. Thyroid Function Tests: No results for input(s): TSH, T4TOTAL, FREET4, T3FREE, THYROIDAB in the last 72 hours. Anemia Panel: Recent Labs    09/01/19 1000  FERRITIN 344*   Urine analysis:    Component Value Date/Time   COLORURINE YELLOW 06/01/2019 0853   APPEARANCEUR CLEAR 06/01/2019 0853   LABSPEC 1.020 06/01/2019 0853   PHURINE 6.5 06/01/2019 0853   GLUCOSEU 250 (A) 06/01/2019 0853   HGBUR NEGATIVE 06/01/2019 0853   BILIRUBINUR NEGATIVE 06/01/2019 0853   KETONESUR NEGATIVE 06/01/2019 0853   PROTEINUR 100 (A) 12/15/2016 1216   UROBILINOGEN 0.2 06/01/2019 0853   NITRITE NEGATIVE 06/01/2019 0853   LEUKOCYTESUR NEGATIVE 06/01/2019 0853   Sepsis  Labs: @LABRCNTIP (procalcitonin:4,lacticidven:4)  ) No results found for this or any previous visit (from the past 240 hour(s)).       Radiology Studies: No results found.      Scheduled Meds: . amLODipine  5 mg Oral Daily  . vitamin C  500 mg Oral Daily  . aspirin EC  81 mg Oral Daily  . Chlorhexidine Gluconate Cloth  6 each Topical Daily  . chlorpheniramine-HYDROcodone  5 mL Oral Q12H  . docusate sodium  100 mg Oral Daily  . enoxaparin (LOVENOX) injection  40 mg Subcutaneous Q12H  . insulin aspart  0-20 Units Subcutaneous TID WC  . insulin aspart  0-5 Units Subcutaneous QHS  . insulin aspart  10 Units Subcutaneous TID WC  . insulin detemir  10 Units Subcutaneous Daily  . Ipratropium-Albuterol  2 puff Inhalation QID  . mometasone-formoterol  2 puff Inhalation BID  . mupirocin ointment   Nasal BID  . predniSONE  20 mg Oral Q breakfast  . zinc sulfate  220 mg Oral Daily   Continuous Infusions: . sodium chloride Stopped (2019-08-11 2259)  . sodium chloride 50 mL/hr at 09/01/19 1600     LOS: 23 days   The patient is critically ill with multiple organ systems failure and requires high complexity decision making for assessment and support, frequent evaluation and titration of therapies, application of advanced monitoring technologies and extensive interpretation of multiple databases. Critical Care Time devoted to patient care services described in this note  Time spent: 40 minutes     Alisia Vanengen, Roselind Messier, MD [facility] Pager [phone]  If 7PM-7AM, please contact night-coverage [email or web address] Password [facility] 09/02/2019, 8:23 AM

## 2019-09-03 ENCOUNTER — Inpatient Hospital Stay (HOSPITAL_COMMUNITY): Payer: Medicare Other

## 2019-09-03 DIAGNOSIS — J432 Centrilobular emphysema: Secondary | ICD-10-CM

## 2019-09-03 DIAGNOSIS — E118 Type 2 diabetes mellitus with unspecified complications: Secondary | ICD-10-CM

## 2019-09-03 DIAGNOSIS — J9621 Acute and chronic respiratory failure with hypoxia: Secondary | ICD-10-CM

## 2019-09-03 LAB — CBC WITH DIFFERENTIAL/PLATELET
Abs Immature Granulocytes: 0.45 10*3/uL — ABNORMAL HIGH (ref 0.00–0.07)
Basophils Absolute: 0 10*3/uL (ref 0.0–0.1)
Basophils Relative: 0 %
Eosinophils Absolute: 0.1 10*3/uL (ref 0.0–0.5)
Eosinophils Relative: 1 %
HCT: 29.7 % — ABNORMAL LOW (ref 39.0–52.0)
Hemoglobin: 9.9 g/dL — ABNORMAL LOW (ref 13.0–17.0)
Immature Granulocytes: 4 %
Lymphocytes Relative: 3 %
Lymphs Abs: 0.4 10*3/uL — ABNORMAL LOW (ref 0.7–4.0)
MCH: 33.7 pg (ref 26.0–34.0)
MCHC: 33.3 g/dL (ref 30.0–36.0)
MCV: 101 fL — ABNORMAL HIGH (ref 80.0–100.0)
Monocytes Absolute: 0.2 10*3/uL (ref 0.1–1.0)
Monocytes Relative: 2 %
Neutro Abs: 9.3 10*3/uL — ABNORMAL HIGH (ref 1.7–7.7)
Neutrophils Relative %: 90 %
Platelets: 151 10*3/uL (ref 150–400)
RBC: 2.94 MIL/uL — ABNORMAL LOW (ref 4.22–5.81)
RDW: 14.6 % (ref 11.5–15.5)
WBC: 10.5 10*3/uL (ref 4.0–10.5)
nRBC: 0.3 % — ABNORMAL HIGH (ref 0.0–0.2)

## 2019-09-03 LAB — FERRITIN: Ferritin: 295 ng/mL (ref 24–336)

## 2019-09-03 LAB — D-DIMER, QUANTITATIVE: D-Dimer, Quant: 0.78 ug/mL-FEU — ABNORMAL HIGH (ref 0.00–0.50)

## 2019-09-03 LAB — MAGNESIUM: Magnesium: 2.1 mg/dL (ref 1.7–2.4)

## 2019-09-03 LAB — COMPREHENSIVE METABOLIC PANEL
ALT: 27 U/L (ref 0–44)
AST: 23 U/L (ref 15–41)
Albumin: 2.3 g/dL — ABNORMAL LOW (ref 3.5–5.0)
Alkaline Phosphatase: 72 U/L (ref 38–126)
Anion gap: 9 (ref 5–15)
BUN: 22 mg/dL (ref 8–23)
CO2: 30 mmol/L (ref 22–32)
Calcium: 8.2 mg/dL — ABNORMAL LOW (ref 8.9–10.3)
Chloride: 101 mmol/L (ref 98–111)
Creatinine, Ser: 0.56 mg/dL — ABNORMAL LOW (ref 0.61–1.24)
GFR calc Af Amer: >60 mL/min (ref >60)
GFR calc non Af Amer: >60 mL/min (ref >60)
Glucose, Bld: 46 mg/dL — ABNORMAL LOW (ref 70–99)
Potassium: 3.9 mmol/L (ref 3.5–5.1)
Sodium: 140 mmol/L (ref 135–145)
Total Bilirubin: 0.9 mg/dL (ref 0.3–1.2)
Total Protein: 4.8 g/dL — ABNORMAL LOW (ref 6.5–8.1)

## 2019-09-03 LAB — C-REACTIVE PROTEIN: CRP: 10.2 mg/dL — ABNORMAL HIGH (ref <1.0)

## 2019-09-03 LAB — GLUCOSE, CAPILLARY
Glucose-Capillary: 67 mg/dL — ABNORMAL LOW (ref 70–99)
Glucose-Capillary: 110 mg/dL — ABNORMAL HIGH (ref 70–99)
Glucose-Capillary: 204 mg/dL — ABNORMAL HIGH (ref 70–99)
Glucose-Capillary: 278 mg/dL — ABNORMAL HIGH (ref 70–99)
Glucose-Capillary: 132 mg/dL — ABNORMAL HIGH (ref 70–99)

## 2019-09-03 LAB — PHOSPHORUS: Phosphorus: 3.3 mg/dL (ref 2.5–4.6)

## 2019-09-03 MED ORDER — INSULIN DETEMIR 100 UNIT/ML ~~LOC~~ SOLN
5.0000 [IU] | Freq: Every day | SUBCUTANEOUS | Status: DC
  Administered 2019-09-04: 5 [IU] via SUBCUTANEOUS
  Filled 2019-09-03: qty 0.05

## 2019-09-03 MED ORDER — IPRATROPIUM-ALBUTEROL 20-100 MCG/ACT IN AERS
2.0000 | INHALATION_SPRAY | Freq: Four times a day (QID) | RESPIRATORY_TRACT | Status: DC
  Administered 2019-09-04 (×2): 2 via RESPIRATORY_TRACT
  Filled 2019-09-03: qty 4

## 2019-09-03 MED ORDER — FUROSEMIDE 10 MG/ML IJ SOLN
40.0000 mg | Freq: Once | INTRAMUSCULAR | Status: AC
  Administered 2019-09-03: 40 mg via INTRAVENOUS
  Filled 2019-09-03: qty 4

## 2019-09-03 NOTE — Consult Note (Signed)
NAME:  Aaron Blair, MRN:  [id number], DOB:  Jul 25, 1934, LOS: 24 ADMISSION DATE:  07/26/2019, CONSULTATION DATE:  09/03/19 REFERRING MD:  Tamala Julian, CHIEF COMPLAINT:  Respiratory failure    Brief History   86 y.o. M admitted 12/29 with Covid-19 PNA, underwent treatment with full course of Decadron, Remdesevir and Actemra x2.  Was apparently DNR, then family reversed to full code this evening.  Pt de-saturating to 80%'s on 15L nasal cannula and 15L non-rebreather mask, so PCCM consulted.  History of present illness   Aaron Blair is a 86 y.o. M with NIDDM, HTN, HL who was admitted 12/29 with fevers, chills and hypoxia and found to have Covid-19 pneumonia.  Had prolonged course completing Decadron, Remdesevir and Actemra x2.   Pt failed to improve and has had increasing oxygen requirements.  Pt was less oriented today and son decided to change code status to full code.   This change was confirmed by [facility] team as well.  On exam, pt is awake, conversational with interpreter, though hypoxic to 80% is in no distress.     Past Medical History   has a past medical history of Chronic gastritis, Diabetes type 2, uncontrolled ([place]), Hyperlipidemia, Hypertension, Lumbar herniated disc, L3-4, and Vertigo.   Significant Hospital Events   12/29 Admit to hospitalists 1/22 Transfer to intensive care  Consults:  PCCM  Procedures:    Significant Diagnostic Tests:  12/30 Echo EF 49-67%, grade 1 diastolic dysfunction 1/2 LE doppler>>no DVT 1/7 CT chest PE protocol>>No PE, multi-focal PNA and emphysema   Micro Data:  12/29 Sars-CoV-2>>positive  12/29 BCx2>>negative 1/10 UC>>negative  1/10 BCx2>>negative   Antimicrobials:   Fluconazole 1/11-1/18  Interim history/subjective:  Increasing oxygen requirement this evening, after discussion with patient's son will be transferred to intensive care for heated high flow  Objective   Blood pressure (!) 162/83, pulse (!) 124, temperature 98.2 F (36.8 C),  temperature source Oral, resp. rate 20, height 5\' 3"  (1.6 m), weight 66.2 kg, SpO2 (!) 80 %.    FiO2 (%):  [100 %] 100 %   Intake/Output Summary (Last 24 hours) at 09/03/2019 2008 Last data filed at 09/03/2019 5916 Gross per 24 hour  Intake 870 ml  Output 1050 ml  Net -180 ml   Filed Weights   08/27/19 0500 08/30/19 0438 08/31/19 0500  Weight: 62 kg 65 kg 66.2 kg   General:  Elderly M, awake and in no distress HEENT: MM pink/moist Neuro: awake and responsive  CV: s1s2 rrr, no m/r/g PULM:  Tachypneic, no accessory muscle use, decreased air movement bilaterally GI: soft, bsx4 active  Extremities: warm/dry, no edema  Skin: no rashes or lesions  Resolved Hospital Problem list     Assessment & Plan:    Acute hypoxic respiratory failure secondary to Covid-19 pneumonia and likely subsequent ARDS and pulmonary fibrosis/emphysema -Completed standard treatment with steroids, Remdesevir and Actemra -no PE on CT  P: -transfer to ICU for heated high flow, poor intubation candidate and will likely be very difficult to extubate -Repeat CXR, if evidence of lobar infiltrate cover for bacterial infection -continue steroid taper with prednisone 20mg  qd -Give Lasix 40mg  x1, received Albumin today -continue GOC discussion with family, may benefit from palliative care consult    Type 2 DM -Continue Levemire 10 units, Novolog 10 units qac and SSI -Last A1c 9.8    HTN, HFpEF -Echo done this admission with diastolic failure and EF 38-46% P: -Lasix 40mg , follow UOP  -continue Amlodipine  Best practice:  Diet: diabetic  Pain/Anxiety/Delirium protocol (if indicated): n/a VAP protocol (if indicated): n/a  DVT prophylaxis: lovenox GI prophylaxis: n/a Glucose control: Lantus, SSI  Mobility: bed rest Code Status: full code Family Communication: plan of care discussed with son Disposition: ICU  Labs   CBC: Recent Labs  Lab 09/01/19 1000 09/02/19 0614 09/03/19 0351  WBC 13.0*  11.4* 10.5  NEUTROABS 12.3* 10.2* 9.3*  HGB 10.5* 11.0* 9.9*  HCT 30.8* 33.5* 29.7*  MCV 100.7* 98.8 101.0*  PLT 143* 165 151    Basic Metabolic Panel: Recent Labs  Lab 08/31/19 0210 08/31/19 1907 09/01/19 1000 09/02/19 0614 09/03/19 0351  NA 131* 135 135 137 140  K 6.0* 6.1* 4.8 4.7 3.9  CL 94* 96* 96* 96* 101  CO2 23 29 29 29 30   GLUCOSE 339* 238* 216* 147* 46*  BUN 53* 46* 32* 28* 22  CREATININE 1.23 1.09 0.70 0.69 0.56*  CALCIUM 9.1 9.0 8.5* 8.6* 8.2*  MG  --   --  2.1 2.3 2.1  PHOS  --   --   --  3.0 3.3   GFR: Estimated Creatinine Clearance: 56.3 mL/min (A) (by C-G formula based on SCr of 0.56 mg/dL (L)). Recent Labs  Lab 09/01/19 1000 09/02/19 0614 09/03/19 0351  WBC 13.0* 11.4* 10.5    Liver Function Tests: Recent Labs  Lab 08/29/19 0150 08/30/19 1645 09/01/19 1000 09/02/19 0614 09/03/19 0351  AST 21 22 19 21 23   ALT 40 37 29 30 27   ALKPHOS 60 60 60 72 72  BILITOT 0.8 0.7 1.3* 1.5* 0.9  PROT 5.7* 5.2* 4.7* 5.2* 4.8*  ALBUMIN 3.0* 2.8* 2.5* 2.7* 2.3*   No results for input(s): LIPASE, AMYLASE in the last 168 hours. No results for input(s): AMMONIA in the last 168 hours.  ABG    Component Value Date/Time   PHART 7.414 2019/09/06 1747   PCO2ART 31.5 (L) Sep 06, 2019 1747   PO2ART 96.0 09-06-19 1747   HCO3 19.8 (L) September 06, 2019 1747   TCO2 21 (L) 2019/09/06 1747   ACIDBASEDEF 3.0 (H) September 06, 2019 1747   O2SAT 97.0 09/06/2019 1747     Coagulation Profile: No results for input(s): INR, PROTIME in the last 168 hours.  Cardiac Enzymes: No results for input(s): CKTOTAL, CKMB, CKMBINDEX, TROPONINI in the last 168 hours.  HbA1C: Hgb A1c MFr Bld  Date/Time Value Ref Range Status  08/11/2019 05:35 AM 9.4 (H) 4.8 - 5.6 % Final    Comment:    (NOTE) Pre diabetes:          5.7%-6.4% Diabetes:              >6.4% Glycemic control for   <7.0% adults with diabetes   06/01/2019 08:53 AM 9.8 (H) 4.6 - 6.5 % Final    Comment:    Glycemic Control  Guidelines for People with Diabetes:Non Diabetic:  <6%Goal of Therapy: <7%Additional Action Suggested:  >8%     CBG: Recent Labs  Lab 09/02/19 2020 09/03/19 0720 09/03/19 0843 09/03/19 1555 09/03/19 1839  GLUCAP 97 67* 110* 204* 278*    Review of Systems:   Negative except as noted in HPI  Past Medical History  He,  has a past medical history of Chronic gastritis, Diabetes type 2, uncontrolled (HCC), Hyperlipidemia, Hypertension, Lumbar herniated disc, L3-4, and Vertigo.   Surgical History    Past Surgical History:  Procedure Laterality Date  . BACK SURGERY    . COLONOSCOPY    . ENDARTERECTOMY Left 07/17/2016  Procedure: ENDARTERECTOMY CAROTID;  Surgeon: Sherren Kerns, MD;  Location[facility];  Service: Vascular;  Laterality: Left;  . SPINE SURGERY  02/05/2016     Social History   reports that he quit smoking about 8 years ago. He quit after 60.00 years of use. He has never used smokeless tobacco. He reports that he does not drink alcohol or use drugs.   Family History   His family history includes Diabetes in his father; Heart attack in his father.   Allergies Allergies  Allergen Reactions  . Statins Other (See Comments)    unknown     Home Medications  Prior to Admission medications   Medication Sig Start Date End Date Taking? Authorizing Provider  acarbose (PRECOSE) 25 MG tablet Take 1 tablet (25 mg total) by mouth 3 (three) times daily with meals. Patient taking differently: Take 25 mg by mouth 2 (two) times daily.  06/01/19  Yes Helane Rima, DO  amLODipine (NORVASC) 5 MG tablet Take 1 tablet (5 mg total) by mouth daily. 06/01/19  Yes Helane Rima, DO  aspirin 81 MG tablet Take 81 mg by mouth daily.   Yes [provider]  glimepiride (AMARYL) 4 MG tablet Take 1 tablet (4 mg total) by mouth daily with breakfast. 06/01/19  Yes Helane Rima, DO  hydrochlorothiazide (HYDRODIURIL) 25 MG tablet Take 1 tablet (25 mg total) by mouth daily. 06/01/19  Yes  Helane Rima, DO  lisinopril (ZESTRIL) 20 MG tablet Take 1 tablet (20 mg total) by mouth daily. 06/01/19  Yes Helane Rima, DO  metFORMIN (GLUCOPHAGE-XR) 500 MG 24 hr tablet Take 2 tablets (1,000 mg total) by mouth at bedtime. 06/01/19  Yes Helane Rima, DO  pioglitazone (ACTOS) 45 MG tablet Take 1 tablet (45 mg total) by mouth daily. 06/01/19  Yes Helane Rima, DO  Elastic Bandages & Supports (MEDICAL COMPRESSION SOCKS) MISC Used to help with swelling 08/19/18   Helane Rima, DO  glucose blood (ONETOUCH VERIO) test strip 1 each by Other route 3 (three) times daily. Use as instructed 06/01/19   Helane Rima, DO  Lancets [facility] DELICA PLUS [place]) MISC 1 each by Other route 3 (three) times daily. 06/01/19   Helane Rima, DO  lisinopril-hydrochlorothiazide (PRINZIDE,ZESTORETIC) 20-25 MG tablet Take 1 tablet by mouth daily. 05/27/17 12/31/17  Helane Rima, DO     Critical care time: 55 minutes    CRITICAL CARE Performed by: Darcella Gasman Naliya Gish   Total critical care time: 55 minutes  Critical care time was exclusive of separately billable procedures and treating other patients.  Critical care was necessary to treat or prevent imminent or life-threatening deterioration.  Critical care was time spent personally by me on the following activities: development of treatment plan with patient and/or surrogate as well as nursing, discussions with consultants, evaluation of patient's response to treatment, examination of patient, obtaining history from patient or surrogate, ordering and performing treatments and interventions, ordering and review of laboratory studies, ordering and review of radiographic studies, pulse oximetry and re-evaluation of patient's condition.  Darcella Gasman Amel Kitch, PA-C [place] [facility]

## 2019-09-03 NOTE — Significant Event (Signed)
Rapid Response Event Note  Overview: Time Called: 1013 Arrival Time: 1015 Event Type: Respiratory  Initial Focused Assessment/Interventions: Patient recently arrived from [facility] to 60% with activity Repositioned in bed, placed in Chair position in bed O2 via NRB  And 15L HFNC O2 sats 87%   Patient able to follow commands, but does not interact much with staff.  Patient is a DNR,  Expectation was not for escalation of care but had transferred so that the family would be able to visit.  1212 Called again because patient had removed his oxygen and his O2 sats dropped to 60%.  Placed back on NRB and 15 L HFNC.  Called MD regarding plan of care. Family coming to bedside to visit.    Plan of Care (if not transferred): RN to call if plan of care changes.  Event Summary: Name of Physician Notified: Dr Joseph Art at      at    Outcome: Stayed in room and stabalized  Event End Time: 1245  Marcellina Millin

## 2019-09-03 NOTE — Progress Notes (Addendum)
PROGRESS NOTE    Aaron Blair  ZOX:[id number] DOB: December 05, 1933 DOA: 2019/09/04 PCP: No primary care provider on file.    Brief Narrative: 86 year old [place] only speaking male with pmh of hypertension, hyperlipidemia, anddiabetes mellitus type 2(uncontrolled).  Patient initially was transferred from [facility] to [facility] for respiratory failure and hypoxia. Patient had reported subjective fevers with chills, poor appetite and started having shortness of breath with lethargy on think his ER presentation. In the ER he was found to have oxygen saturation in the 50s on room air, CXR consistent with viral pneumonia, SARS-CoV-2 positive. Patient transferred for further management. No reported nausea, vomiting, diarrhea. He has intermittent cough which is nonproductive. No reported chest pain, palpitations, orthopnea, PND, lower extremity edema.    Assessment & Plan: Acute respiratory failure with hypoxia secondary to pneumonia due to COVID-19 Patient does not appear to be improving still on high flow nasal cannula oxygen with O2 saturation dropping into the 70s with any movement.  Son makes note that he would like to give his dad more of a fighting chance and would want him to be resuscitated if his heart were to stop or he were to stop breathing.   Patient reported to be out of the infectious stage at this been over 21 days since patient was initially diagnosed.  However, his symptoms have not  improved. 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 -Decadron low-dose ineffective, will attempt high-dose Solu-Medrol 80 mg TID short course (4 days)  Macrocytic anemia:  -Hemoglobin 9.9 with elevated MCV. -Check vitamin B-12 and folate in a.m.  Diabetes type 2 uncontrolled with complication -10/20 hemoglobin A1c= 9.8 -1/1 Endo tool until patient's CBG back under control -Levemir 10 units daily -NovoLog 10 units qac -resistant SSI  Elevated D-dimer -CTA PE protocol negative see results below -Bilateral lower extremity Doppler negative DVT -1/7 D-dimer increasing with increasing SOB. Repeat CTA PE protocol negative PE, see results below  Chest pain/elevated troponin/demand ischemia -Echocardiogram; not consistent with ACS/MI see results below  Essential HTN -Amlodipine 5 mg daily   Chronic diastolic CHF -See HTN -Strict I&Os -180 -Daily weight  Hypokalemia/Hyperkalemia -Potassium goal> 4 -1/20 Lokelma 10 g x 2 dose -Potassium within normal limits today    DVT prophylaxis: Lovenox Code Status: FULL patient switch from DNR as patient son stated that he would want his father resuscitated if his heart were to stop or if he would stop breathing. Family Communication: Discussed plan of care with the son over the phone.  Disposition Plan:  . Patient came from: home           Consultants:   PCCM  Procedures/significant event:  12/30 Echocardiogram Left Ventricle: EV= 65 to 70%. - moderately increased LVH Aortic Valve: Severely calcified AoV with focal calcification of the NCC. Restricted movement of the [facility] 12/30 CTA chest PE protocol;.-Extensive ground-glass opacities in keeping with history of COVID-19 positivity. 12/30 Actemra x1 dose  Motion degraded study with no evidence of pulmonary embolism. 1/2 bilateral lower extremity Doppler; negative DVT  1/3 PCXR; 1/7 Actemra times second dose 1/7 CTA PE protocol;-negative PE  -Mildly improved multifocal pneumonia  related to COVID-19 infection. -Emphysema  1/10 PCXR;-extensive bilateral pulmonary infiltrates appear essentially unchanged since the prior CT scan.  -Emphysematous changesin the upper lobes    Antimicrobials:   None   Subjective: Patient reported to be complaining of worsening shortness of breath with use of translator services.  In talks with the son who speaks English over the phone makes it known that he wants to give his dad fighting chance.  When asked specifically if his heart were to stop or he would stop breathing son reports that he would like the patient to be reresuscitated.  Objective: Vitals:   09/03/19 1500 09/03/19 1530 09/03/19 1549 09/03/19 1559  BP: 134/68 (!) 173/79 (!) 151/79 (!) 162/83  Pulse: (!) 120 (!) 136 (!) 122 (!) 124  Resp:    20  Temp:    98.2 F (36.8 C)  TempSrc:    Oral  SpO2:    (!) 80%  Weight:      Height:        Intake/Output Summary (Last 24 hours) at 09/03/2019 1914 Last data filed at 09/03/2019 0092 Gross per 24 hour  Intake 870 ml  Output 1050 ml  Net -180 ml   Filed Weights   08/27/19 0500 08/30/19 0438 08/31/19 0500  Weight: 62 kg 65 kg 66.2 kg    Examination:  General exam: Elderly male who appears significantly fatigued with any movement or talking Respiratory system: Decreased overall aeration on nonrebreather.. Cardiovascular system: Tachycardic, RRR. No JVD, murmurs, rubs, gallops or clicks. No pedal edema. Gastrointestinal system: Abdomen is nondistended, soft and nontender. No organomegaly or masses felt. Normal bowel sounds heard. Central nervous system: Alert and oriented. No focal neurological deficits. Extremities: Symmetric 5 x 5 power. Skin: No rashes, lesions or ulcers Psychiatry: Judgement and insight appear normal. Mood & affect appropriate.     Data Reviewed:  CBC: Recent Labs  Lab 09/01/19 1000 09/02/19 0614 09/03/19 0351  WBC 13.0* 11.4* 10.5  NEUTROABS 12.3* 10.2* 9.3*  HGB 10.5* 11.0* 9.9*   HCT 30.8* 33.5* 29.7*  MCV 100.7* 98.8 101.0*  PLT 143* 165 151   Basic Metabolic Panel: Recent Labs  Lab 08/31/19 0210 08/31/19 1907 09/01/19 1000 09/02/19 0614 09/03/19 0351  NA 131* 135 135 137 140  K 6.0* 6.1* 4.8 4.7 3.9  CL 94* 96* 96* 96* 101  CO2 23 29 29 29 30   GLUCOSE 339* 238* 216* 147* 46*  BUN 53* 46* 32* 28* 22  CREATININE 1.23 1.09 0.70 0.69 0.56*  CALCIUM 9.1 9.0 8.5* 8.6* 8.2*  MG  --   --  2.1 2.3 2.1  PHOS  --   --   --  3.0 3.3   GFR: Estimated Creatinine Clearance: 56.3 mL/min (A) (by C-G formula based on SCr of 0.56 mg/dL (L)). Liver Function Tests: Recent Labs  Lab 08/29/19 0150 08/30/19 1645 09/01/19 1000 09/02/19 0614 09/03/19 0351  AST 21 22 19 21 23   ALT 40 37 29 30 27   ALKPHOS 60 60 60 72 72  BILITOT 0.8 0.7 1.3* 1.5* 0.9  PROT 5.7* 5.2* 4.7* 5.2* 4.8*  ALBUMIN 3.0* 2.8* 2.5* 2.7* 2.3*   No results for input(s): LIPASE, AMYLASE in the last 168 hours. No results for input(s): AMMONIA in the last 168 hours. Coagulation Profile: No results for input(s): INR, PROTIME in the last 168 hours. Cardiac Enzymes: No results for input(s): CKTOTAL,  CKMB, CKMBINDEX, TROPONINI in the last 168 hours. BNP (last 3 results) No results for input(s): PROBNP in the last 8760 hours. HbA1C: No results for input(s): HGBA1C in the last 72 hours. CBG: Recent Labs  Lab 09/02/19 2020 09/03/19 0720 09/03/19 0843 09/03/19 1555 09/03/19 1839  GLUCAP 97 67* 110* 204* 278*   Lipid Profile: No results for input(s): CHOL, HDL, LDLCALC, TRIG, CHOLHDL, LDLDIRECT in the last 72 hours. Thyroid Function Tests: No results for input(s): TSH, T4TOTAL, FREET4, T3FREE, THYROIDAB in the last 72 hours. Anemia Panel: Recent Labs    09/02/19 0614 09/03/19 0351  FERRITIN 376* 295   Sepsis Labs: No results for input(s): PROCALCITON, LATICACIDVEN in the last 168 hours.  No results found for this or any previous visit (from the past 240 hour(s)).        Radiology Studies: No results found.      Scheduled Meds: . amLODipine  5 mg Oral Daily  . vitamin C  500 mg Oral Daily  . aspirin EC  81 mg Oral Daily  . Chlorhexidine Gluconate Cloth  6 each Topical Daily  . chlorpheniramine-HYDROcodone  5 mL Oral Q12H  . docusate sodium  100 mg Oral Daily  . enoxaparin (LOVENOX) injection  40 mg Subcutaneous Q12H  . insulin aspart  0-20 Units Subcutaneous TID WC  . insulin aspart  0-5 Units Subcutaneous QHS  . insulin aspart  14 Units Subcutaneous TID WC  . insulin detemir  10 Units Subcutaneous Daily  . Ipratropium-Albuterol  2 puff Inhalation Q6H WA  . mometasone-formoterol  2 puff Inhalation BID  . mupirocin ointment   Nasal BID  . predniSONE  20 mg Oral Q breakfast  . zinc sulfate  220 mg Oral Daily   Continuous Infusions: . sodium chloride Stopped (08/08/2019 2259)  . sodium chloride 50 mL/hr at 09/03/19 1741     LOS: 24 days    Time spent: 20 minutes    Norval Morton, MD [facility]   To contact the attending provider between 7A-7P or the covering provider during after hours 7P-7A, please log into the web site [email or web address] and access using universal [place] password for that web site. If you do not have the password, please call the hospital operator.  09/03/2019, 7:14 PM

## 2019-09-03 NOTE — Progress Notes (Signed)
PT Cancellation Note  Patient Details Name: Aaron Blair MRN: [id number] DOB: 12-31-1933   Cancelled Treatment:    Reason Eval/Treat Not Completed: Other (comment)transferred to [facility]. GOC are being considered.   Rada Hay 09/03/2019, 11:45 AM  Blanchard Kelch PT Acute Rehabilitation Services Pager [phone] Office [phone]

## 2019-09-03 NOTE — Progress Notes (Signed)
Called to patient's room to assess patient. Patient was on a 15L NRB & a 15L salter HFNC upon arrival with SAT's of 88%. RN to discuss goals of care with MD.

## 2019-09-03 NOTE — Progress Notes (Signed)
Asked to assist with transfer of pt to 28M for HHFNC. Pt transferred to 28M05 with RT and RN.

## 2019-09-04 DIAGNOSIS — J439 Emphysema, unspecified: Secondary | ICD-10-CM

## 2019-09-04 DIAGNOSIS — Z7189 Other specified counseling: Secondary | ICD-10-CM

## 2019-09-04 LAB — CBC WITH DIFFERENTIAL/PLATELET
Abs Immature Granulocytes: 0.2 10*3/uL — ABNORMAL HIGH (ref 0.00–0.07)
Basophils Absolute: 0 10*3/uL (ref 0.0–0.1)
Basophils Relative: 0 %
Eosinophils Absolute: 0.1 10*3/uL (ref 0.0–0.5)
Eosinophils Relative: 2 %
HCT: 30.9 % — ABNORMAL LOW (ref 39.0–52.0)
Hemoglobin: 10.2 g/dL — ABNORMAL LOW (ref 13.0–17.0)
Immature Granulocytes: 2 %
Lymphocytes Relative: 4 %
Lymphs Abs: 0.4 10*3/uL — ABNORMAL LOW (ref 0.7–4.0)
MCH: 33.2 pg (ref 26.0–34.0)
MCHC: 33 g/dL (ref 30.0–36.0)
MCV: 100.7 fL — ABNORMAL HIGH (ref 80.0–100.0)
Monocytes Absolute: 0.2 10*3/uL (ref 0.1–1.0)
Monocytes Relative: 2 %
Neutro Abs: 8.1 10*3/uL — ABNORMAL HIGH (ref 1.7–7.7)
Neutrophils Relative %: 90 %
Platelets: 142 10*3/uL — ABNORMAL LOW (ref 150–400)
RBC: 3.07 MIL/uL — ABNORMAL LOW (ref 4.22–5.81)
RDW: 14.8 % (ref 11.5–15.5)
WBC: 9.1 10*3/uL (ref 4.0–10.5)
nRBC: 0 % (ref 0.0–0.2)

## 2019-09-04 LAB — COMPREHENSIVE METABOLIC PANEL
ALT: 31 U/L (ref 0–44)
AST: 27 U/L (ref 15–41)
Albumin: 2 g/dL — ABNORMAL LOW (ref 3.5–5.0)
Alkaline Phosphatase: 84 U/L (ref 38–126)
Anion gap: 14 (ref 5–15)
BUN: 29 mg/dL — ABNORMAL HIGH (ref 8–23)
CO2: 23 mmol/L (ref 22–32)
Calcium: 8.2 mg/dL — ABNORMAL LOW (ref 8.9–10.3)
Chloride: 101 mmol/L (ref 98–111)
Creatinine, Ser: 1.04 mg/dL (ref 0.61–1.24)
GFR calc Af Amer: >60 mL/min (ref >60)
GFR calc non Af Amer: >60 mL/min (ref >60)
Glucose, Bld: 108 mg/dL — ABNORMAL HIGH (ref 70–99)
Potassium: 4.5 mmol/L (ref 3.5–5.1)
Sodium: 138 mmol/L (ref 135–145)
Total Bilirubin: 1.3 mg/dL — ABNORMAL HIGH (ref 0.3–1.2)
Total Protein: 5.9 g/dL — ABNORMAL LOW (ref 6.5–8.1)

## 2019-09-04 LAB — FERRITIN: Ferritin: 284 ng/mL (ref 24–336)

## 2019-09-04 LAB — GLUCOSE, CAPILLARY
Glucose-Capillary: 116 mg/dL — ABNORMAL HIGH (ref 70–99)
Glucose-Capillary: 154 mg/dL — ABNORMAL HIGH (ref 70–99)
Glucose-Capillary: 154 mg/dL — ABNORMAL HIGH (ref 70–99)

## 2019-09-04 LAB — VITAMIN B12: Vitamin B-12: 809 pg/mL (ref 180–914)

## 2019-09-04 LAB — C-REACTIVE PROTEIN: CRP: 10.4 mg/dL — ABNORMAL HIGH (ref <1.0)

## 2019-09-04 MED ORDER — LISINOPRIL 20 MG PO TABS
20.0000 mg | ORAL_TABLET | Freq: Every day | ORAL | Status: DC
  Administered 2019-09-04: 20 mg via ORAL
  Filled 2019-09-04 (×2): qty 1

## 2019-09-04 MED ORDER — MELATONIN 3 MG PO TABS
3.0000 mg | ORAL_TABLET | Freq: Every evening | ORAL | Status: DC | PRN
  Administered 2019-09-04: 3 mg via OROMUCOSAL
  Filled 2019-09-04 (×2): qty 1

## 2019-09-04 MED ORDER — MORPHINE SULFATE (PF) 2 MG/ML IV SOLN
1.0000 mg | INTRAVENOUS | Status: DC | PRN
  Administered 2019-09-04 (×2): 2 mg via INTRAVENOUS
  Filled 2019-09-04 (×2): qty 1

## 2019-09-04 MED ORDER — POTASSIUM CHLORIDE CRYS ER 20 MEQ PO TBCR
40.0000 meq | EXTENDED_RELEASE_TABLET | Freq: Three times a day (TID) | ORAL | Status: DC
  Filled 2019-09-04: qty 2

## 2019-09-04 MED ORDER — FUROSEMIDE 10 MG/ML IJ SOLN
40.0000 mg | Freq: Four times a day (QID) | INTRAMUSCULAR | Status: DC
  Administered 2019-09-04: 40 mg via INTRAVENOUS
  Filled 2019-09-04: qty 4

## 2019-09-04 MED ORDER — METOLAZONE 5 MG PO TABS
10.0000 mg | ORAL_TABLET | Freq: Once | ORAL | Status: DC
  Filled 2019-09-04: qty 2

## 2019-09-04 MED ORDER — MORPHINE 100MG IN NS 100ML (1MG/ML) PREMIX INFUSION
1.0000 mg/h | INTRAVENOUS | Status: DC
  Administered 2019-09-04: 1 mg/h via INTRAVENOUS
  Filled 2019-09-04: qty 100

## 2019-09-04 NOTE — Progress Notes (Signed)
After discussion with patient again using [place] Interpreter and his son, decision made to transition to comfort care -start PRN morphine and gtt as needed  Zannie Cove, MD

## 2019-09-04 NOTE — Progress Notes (Addendum)
PROGRESS NOTE    Aaron Blair  VQM:[id number] DOB: 09-29-1933 DOA: 08/07/2019 PCP: No primary care provider on file.  Brief Narrative: 30 [place] male with history of emphysema, type 2 diabetes mellitus, dyslipidemia, hypertension was admitted to [facility] on 12/29 with severe hypoxic respiratory failure from Covid, he completed his course of remdesivir, Decadron, was given Actemra x2, prolonged therapy for respiratory failure, unfortunately continued to worsen, imaging notes patchy heterogenous bilateral airspace disease consistent with sequelae of infection, central lobar emphysema, fibrotic phase ARDS, prognosis felt to be very poor,  -He has been requiring 15 to 20 L high flow nasal cannula in addition to a nonrebreather mask, transferred to [facility] yesterday evening so that his family would be able to visit him, unfortunately had to be transferred back to the ICU given worsening oxygen requirements. -He is now on 50 heated high flow nasal cannula, in addition to a nonrebreather mask -Picked up this patient today 1/23 from my partner Dr.Woods at [facility]  Assessment & Plan:   Severe Acute on chronic hypoxic respiratory failure/ COVID pneumonia ARDS -Has underlying emphysema, worsened by Covid pneumonia, fibrotic phase ARDS -Completed course of IV Decadron, remdesivir, Actemra first dose on 12/30 and second dose on 1/7 -Former smoker, 60-pack-year history of smoking quit in 2012 -With worsening oxygen requirements, currently on 50 L of heated high flow and nonrebreather mask -I talked with the patient with a [place] interpreter today, discussed the futility of intubation given prolonged respiratory failure and limited utility at this point,  -Patient was told me twice that he wants to die in his sleep, he does not want to be put on a ventilator -PCCM discussed with patient and family as well as, now DNR -Palliative medicine consulted for goals of care -Continue proning as  tolerated -IV Lasix -He has had 2 CT angiograms this admission which were negative for pulmonary embolism -Anticipate hospital demise -DNR now -d/w PCCM Dr.Yacoub who signed off and recommended Palliative care  Severe emphysema/COPD -See above  Type 2 diabetes mellitus -Hemoglobin A1c is 9.8 -Continue Levemir, NovoLog meal coverage, sliding scale  Elevated D-dimer -CTA 12/30 and 1/7 - for PE -Lower extremity Dopplers 1/2 - for DVT -continue lovenox BID  Demand ischemia -2D echocardiogram was completed earlier this admission, not consistent with ACS -Not in any condition for cardiac work-up  Acute on chronic diastolic CHF -Echo with preserved EF, has been getting as needed IV Lasix -Diuresed with IV Lasix again today  DVT prophylaxis: lovenox BID Code Status: DNR now Family Communication: called and d/w son  Disposition Plan: anticipate hospital demise  Consultants:      Procedures:   Antimicrobials:    Subjective: -worsening O2 requirement, poor Po intake  Objective: Vitals:   09/04/19 0744 09/04/19 0800 09/04/19 0808 09/04/19 0922  BP:  (!) 170/79  (!) 168/76  Pulse: (!) 101 (!) 104 97   Resp: 17 18 12    Temp: 97.7 F (36.5 C)     TempSrc: Axillary     SpO2: 93% (!) 89% 94%   Weight:      Height:        Intake/Output Summary (Last 24 hours) at 09/04/2019 1054 Last data filed at 09/04/2019 1000 Gross per 24 hour  Intake 355 ml  Output 1175 ml  Net -820 ml   Filed Weights   08/30/19 0438 08/31/19 0500 09/03/19 2112  Weight: 65 kg 66.2 kg 64 kg    Examination:  General exam: Elderly frail  chronically ill-appearing male, sitting up in bed, mild respiratory distress, awake alert, oriented to self and partly to place Respiratory system: Distant breath sounds, bilateral rales Cardiovascular system: S1 & S2 heard, RRR.  Gastrointestinal system: Abdomen is nondistended, soft and nontender.Normal bowel sounds heard. Central nervous system: Alert and  oriented. No focal neurological deficits. Extremities: No edema Skin: No rashes, lesions or ulcers Psychiatry: Appears depressed    Data Reviewed:   CBC: Recent Labs  Lab 09/01/19 1000 09/02/19 0614 09/03/19 0351 09/04/19 0646  WBC 13.0* 11.4* 10.5 9.1  NEUTROABS 12.3* 10.2* 9.3* 8.1*  HGB 10.5* 11.0* 9.9* 10.2*  HCT 30.8* 33.5* 29.7* 30.9*  MCV 100.7* 98.8 101.0* 100.7*  PLT 143* 165 151 035*   Basic Metabolic Panel: Recent Labs  Lab 08/31/19 1907 09/01/19 1000 09/02/19 0614 09/03/19 0351 09/04/19 0646  NA 135 135 137 140 138  K 6.1* 4.8 4.7 3.9 4.5  CL 96* 96* 96* 101 101  CO2 29 29 29 30 23   GLUCOSE 238* 216* 147* 46* 108*  BUN 46* 32* 28* 22 29*  CREATININE 1.09 0.70 0.69 0.56* 1.04  CALCIUM 9.0 8.5* 8.6* 8.2* 8.2*  MG  --  2.1 2.3 2.1  --   PHOS  --   --  3.0 3.3  --    GFR: Estimated Creatinine Clearance: 43.3 mL/min (by C-G formula based on SCr of 1.04 mg/dL). Liver Function Tests: Recent Labs  Lab 08/30/19 1645 09/01/19 1000 09/02/19 0614 09/03/19 0351 09/04/19 0646  AST 22 19 21 23 27   ALT 37 29 30 27 31   ALKPHOS 60 60 72 72 84  BILITOT 0.7 1.3* 1.5* 0.9 1.3*  PROT 5.2* 4.7* 5.2* 4.8* 5.9*  ALBUMIN 2.8* 2.5* 2.7* 2.3* 2.0*   No results for input(s): LIPASE, AMYLASE in the last 168 hours. No results for input(s): AMMONIA in the last 168 hours. Coagulation Profile: No results for input(s): INR, PROTIME in the last 168 hours. Cardiac Enzymes: No results for input(s): CKTOTAL, CKMB, CKMBINDEX, TROPONINI in the last 168 hours. BNP (last 3 results) No results for input(s): PROBNP in the last 8760 hours. HbA1C: No results for input(s): HGBA1C in the last 72 hours. CBG: Recent Labs  Lab 09/03/19 0843 09/03/19 1555 09/03/19 1839 09/03/19 2107 09/04/19 0758  GLUCAP 110* 204* 278* 132* 116*   Lipid Profile: No results for input(s): CHOL, HDL, LDLCALC, TRIG, CHOLHDL, LDLDIRECT in the last 72 hours. Thyroid Function Tests: No results for  input(s): TSH, T4TOTAL, FREET4, T3FREE, THYROIDAB in the last 72 hours. Anemia Panel: Recent Labs    09/03/19 0351 09/04/19 0646  VITAMINB12  --  809  FERRITIN 295 284   Urine analysis:    Component Value Date/Time   COLORURINE YELLOW 06/01/2019 [place] 06/01/2019 0853   LABSPEC 1.020 06/01/2019 0853   PHURINE 6.5 06/01/2019 0853   GLUCOSEU 250 (A) 06/01/2019 0853   HGBUR NEGATIVE 06/01/2019 0853   BILIRUBINUR NEGATIVE 06/01/2019 0853   KETONESUR NEGATIVE 06/01/2019 0853   PROTEINUR 100 (A) 12/15/2016 1216   UROBILINOGEN 0.2 06/01/2019 0853   NITRITE NEGATIVE 06/01/2019 0853   LEUKOCYTESUR NEGATIVE 06/01/2019 0853   Sepsis Labs: @LABRCNTIP (procalcitonin:4,lacticidven:4)  )No results found for this or any previous visit (from the past 240 hour(s)).       Radiology Studies: DG CHEST PORT 1 VIEW  Result Date: 09/03/2019 CLINICAL DATA:  Hypoxia. COVID positive 08-12-19 EXAM: PORTABLE CHEST 1 VIEW COMPARISON:  Radiograph 08/30/2019, CT 08/19/2019. FINDINGS: Patchy heterogeneous bilateral airspace disease,  not significantly changed in the interim. Unchanged heart size and mediastinal contours. No pneumothorax. No large pleural effusion. IMPRESSION: Unchanged patchy heterogeneous bilateral airspace disease, consistent with COVID pneumonia or sequela/ARDS. Electronically Signed   By: Narda Rutherford M.D.   On: 09/03/2019 21:22        Scheduled Meds: . amLODipine  5 mg Oral Daily  . vitamin C  500 mg Oral Daily  . aspirin EC  81 mg Oral Daily  . Chlorhexidine Gluconate Cloth  6 each Topical Daily  . chlorpheniramine-HYDROcodone  5 mL Oral Q12H  . docusate sodium  100 mg Oral Daily  . enoxaparin (LOVENOX) injection  40 mg Subcutaneous Q12H  . furosemide  40 mg Intravenous Q6H  . insulin aspart  0-20 Units Subcutaneous TID WC  . insulin aspart  0-5 Units Subcutaneous QHS  . insulin detemir  5 Units Subcutaneous Daily  . Ipratropium-Albuterol  2 puff  Inhalation Q6H WA  . lisinopril  20 mg Oral Daily  . metolazone  10 mg Oral Once  . mometasone-formoterol  2 puff Inhalation BID  . mupirocin ointment   Nasal BID  . potassium chloride  40 mEq Oral TID  . predniSONE  20 mg Oral Q breakfast  . zinc sulfate  220 mg Oral Daily   Continuous Infusions: . sodium chloride Stopped (08/01/2019 2259)     LOS: 25 days    Time spent:   Zannie Cove, MD [facility]  09/04/2019, 10:54 AM

## 2019-09-04 NOTE — Consult Note (Signed)
NAME:  Aaron Blair, MRN:  [id number], DOB:  17-Aug-1933, LOS: 25 ADMISSION DATE:  07/23/2019, CONSULTATION DATE:  09/04/19 REFERRING MD:  Katrinka Blazing, CHIEF COMPLAINT:  Respiratory failure    Brief History   86 y.o. M admitted 12/29 with Covid-19 PNA, underwent treatment with full course of Decadron, Remdesevir and Actemra x2.  Was apparently DNR, then family reversed to full code this evening.  Pt de-saturating to 80%'s on 15L nasal cannula and 15L non-rebreather mask, so PCCM consulted.  History of present illness   Aaron Blair is a 86 y.o. M with NIDDM, HTN, HL who was admitted 12/29 with fevers, chills and hypoxia and found to have Covid-19 pneumonia.  Had prolonged course completing Decadron, Remdesevir and Actemra x2.   Pt failed to improve and has had increasing oxygen requirements.  Pt was less oriented today and son decided to change code status to full code.   This change was confirmed by [facility] team as well.  On exam, pt is awake, conversational with interpreter, though hypoxic to 80% is in no distress.     Past Medical History   has a past medical history of Chronic gastritis, Diabetes type 2, uncontrolled (HCC), Hyperlipidemia, Hypertension, Lumbar herniated disc, L3-4, and Vertigo.   Significant Hospital Events   12/29 Admit to hospitalists 1/22 Transfer to intensive care  Consults:  PCCM  Procedures:    Significant Diagnostic Tests:  12/30 Echo EF 65-70%, grade 1 diastolic dysfunction 1/2 LE doppler>>no DVT 1/7 CT chest PE protocol>>No PE, multi-focal PNA and emphysema   Micro Data:  12/29 Sars-CoV-2>>positive  12/29 BCx2>>negative 1/10 UC>>negative  1/10 BCx2>>negative   Antimicrobials:   Fluconazole 1/11-1/18  Interim history/subjective:  Persistent desaturation overnight, now up to 100% NRB  Objective   Blood pressure (!) 163/78, pulse 97, temperature 97.7 F (36.5 C), temperature source Axillary, resp. rate 12, height 5\' 3"  (1.6 m), weight 64 kg, SpO2 94 %.      FiO2 (%):  [50 %-100 %] 100 %   Intake/Output Summary (Last 24 hours) at 09/04/2019 0830 Last data filed at 09/04/2019 0600 Gross per 24 hour  Intake 355 ml  Output 850 ml  Net -495 ml   Filed Weights   08/30/19 0438 08/31/19 0500 09/03/19 2112  Weight: 65 kg 66.2 kg 64 kg   General:  Chronically ill appearing male, NAD on 100% HFNC HEENT: [place]/AT, PERRL, EOM-I and MMM Neuro: Alert and interactive, moving all ext to command CV: RRR, Nl S1/S2 and -M/R/G PULM: High RR with diffuse rhonchi  GI: Soft, NT, ND and +BS Extremities: -edema and -tenderness Skin: no rashes or lesions  I reviewed CXR myself, infiltrate noted  Discussed with bedside RN and RT  Resolved Hospital Problem list     Assessment & Plan:    Acute hypoxic respiratory failure secondary to Covid-19 pneumonia and likely subsequent ARDS and pulmonary fibrosis/emphysema -Completed standard treatment with steroids, Remdesevir and Actemra -no PE on CT  P: - Hold in the ICU given evolving respiratory failure - CXR if worsens  - Continue steroid taper with prednisone 20mg  qd - Active diureses today - Will consult palliative care, little benefit to intubation here  Type 2 DM - Continue Levemire 10 units, Novolog 10 units qac and SSI - Last A1c 9.8  HTN, HFpEF -Echo done this admission with diastolic failure and EF 65-70% P: - Lasix 40 mg IV q6 x3 doses - Zaroxolyn 10 mg PO x1 - Replace electrolytes as indicated - Continue Amlodipine  Spoke with son over the phone, informed him that patient is refusing medications, does not want intubation and after discussion, he agreed to DNR status and will video chat with father to convince to keep O2 on and take medications.  Code status updated.  PCCM will sign off.  Best practice:  Diet: diabetic  Pain/Anxiety/Delirium protocol (if indicated): n/a VAP protocol (if indicated): n/a  DVT prophylaxis: lovenox GI prophylaxis: n/a Glucose control: Lantus, SSI   Mobility: bed rest Code Status: full code Family Communication: plan of care discussed with son Disposition: ICU  Labs   CBC: Recent Labs  Lab 09/01/19 1000 09/02/19 0614 09/03/19 0351  WBC 13.0* 11.4* 10.5  NEUTROABS 12.3* 10.2* 9.3*  HGB 10.5* 11.0* 9.9*  HCT 30.8* 33.5* 29.7*  MCV 100.7* 98.8 101.0*  PLT 143* 165 151    Basic Metabolic Panel: Recent Labs  Lab 08/31/19 0210 08/31/19 1907 09/01/19 1000 09/02/19 0614 09/03/19 0351  NA 131* 135 135 137 140  K 6.0* 6.1* 4.8 4.7 3.9  CL 94* 96* 96* 96* 101  CO2 23 29 29 29 30   GLUCOSE 339* 238* 216* 147* 46*  BUN 53* 46* 32* 28* 22  CREATININE 1.23 1.09 0.70 0.69 0.56*  CALCIUM 9.1 9.0 8.5* 8.6* 8.2*  MG  --   --  2.1 2.3 2.1  PHOS  --   --   --  3.0 3.3   GFR: Estimated Creatinine Clearance: 56.3 mL/min (A) (by C-G formula based on SCr of 0.56 mg/dL (L)). Recent Labs  Lab 09/01/19 1000 09/02/19 0614 09/03/19 0351  WBC 13.0* 11.4* 10.5    Liver Function Tests: Recent Labs  Lab 08/29/19 0150 08/30/19 1645 09/01/19 1000 09/02/19 0614 09/03/19 0351  AST 21 22 19 21 23   ALT 40 37 29 30 27   ALKPHOS 60 60 60 72 72  BILITOT 0.8 0.7 1.3* 1.5* 0.9  PROT 5.7* 5.2* 4.7* 5.2* 4.8*  ALBUMIN 3.0* 2.8* 2.5* 2.7* 2.3*   No results for input(s): LIPASE, AMYLASE in the last 168 hours. No results for input(s): AMMONIA in the last 168 hours.  ABG    Component Value Date/Time   PHART 7.414 08/07/2019 1747   PCO2ART 31.5 (L) 08/09/2019 1747   PO2ART 96.0 07/31/2019 1747   HCO3 19.8 (L) 07/14/2019 1747   TCO2 21 (L) 08/06/2019 1747   ACIDBASEDEF 3.0 (H) 08/09/2019 1747   O2SAT 97.0 08/09/2019 1747     Coagulation Profile: No results for input(s): INR, PROTIME in the last 168 hours.  Cardiac Enzymes: No results for input(s): CKTOTAL, CKMB, CKMBINDEX, TROPONINI in the last 168 hours.  HbA1C: Hgb A1c MFr Bld  Date/Time Value Ref Range Status  08/11/2019 05:35 AM 9.4 (H) 4.8 - 5.6 % Final    Comment:     (NOTE) Pre diabetes:          5.7%-6.4% Diabetes:              >6.4% Glycemic control for   <7.0% adults with diabetes   06/01/2019 08:53 AM 9.8 (H) 4.6 - 6.5 % Final    Comment:    Glycemic Control Guidelines for People with Diabetes:Non Diabetic:  <6%Goal of Therapy: <7%Additional Action Suggested:  >8%     CBG: Recent Labs  Lab 09/03/19 0843 09/03/19 1555 09/03/19 1839 09/03/19 2107 09/04/19 0758  GLUCAP 110* 204* 278* 132* 116*   The patient is critically ill with multiple organ systems failure and requires high complexity decision making for assessment and support, frequent  evaluation and titration of therapies, application of advanced monitoring technologies and extensive interpretation of multiple databases.   Critical Care Time devoted to patient care services described in this note is  33  Minutes. This time reflects time of care of this signee Dr Jennet Maduro. This critical care time does not reflect procedure time, or teaching time or supervisory time of PA/NP/Med student/Med Resident etc but could involve care discussion time.  Rush Farmer, M.D. [facility] Pulmonary/Critical Care Medicine.

## 2019-09-04 NOTE — Progress Notes (Addendum)
eLink Physician-Brief Progress Note Patient Name: Aaron Blair DOB: 07-07-1934 MRN: [id number]   Date of Service  09/04/2019  HPI/Events of Note  Multiple issues: 1. Patient requests sleep aid and 2. Hypertension - BP = 174/78.  eICU Interventions  Will order: 1. Melatonin 3 mg PO Q HS PRN sleep. Pharmacy to take verbal order.  2. Will start home Lisinopril 20 mg PO Q day.      Intervention Category Major Interventions: Other:  Marshea Wisher Dennard Nip 09/04/2019, 3:06 AM

## 2019-09-04 NOTE — Plan of Care (Signed)

## 2019-09-04 NOTE — Progress Notes (Signed)
Physical Therapy Treatment Patient Details Name: Aaron Blair MRN: [id number] DOB: 12/10/1933 Today's Date: 09/04/2019    History of Present Illness Pt is an 86 y.o. male admitted 2019-09-03 with dyspnea, fever, chills, poor appetite. Worked up for severe hypoxic respiratory failure secondary to SARS COVID-19 viral PNA. Worsening condition, imaging note patchy heterogenous bilateral airspace disease consistent with sequelae of infection, central lobar emphysema, fibrotic phase ARDS. PMH includes DM2, HTN.   PT Comments    Pt's respiratory status remains tenuous. Pt able to tolerate sitting EOB ~8-10 minutes with SpO2 down to 69%,  on 50L heated HFNC (100% FiO2) + 15L NRB, with minimal activity; required prolonged rest and return to supine for recovery to 87-90%. Pt following commands appropriately, seems very fatigued, closing eyes whenever sitting still. Will continue to follow acutely and progress mobility as pt can tolerate.   Follow Up Recommendations  Home health PT;Supervision/Assistance - 24 hour     Equipment Recommendations  Wheelchair (measurements PT)    Recommendations for Other Services       Precautions / Restrictions Precautions Precautions: Fall;Other (comment) Precaution Comments: 50L heated HFNC at 100% FiO2 + 15L NRB; quick desaturate, prolonged time to recover SpO2 Restrictions Weight Bearing Restrictions: No    Mobility  Bed Mobility Overal bed mobility: Needs Assistance Bed Mobility: Supine to Sit;Sit to Supine     Supine to sit: Min assist;HOB elevated Sit to supine: Supervision   General bed mobility comments: MinA for HHA to scoot to EOB. SpO2 down to 69% with this, requiring >10 min to recover; pt returning to supine, limited by fatigue  Transfers                 General transfer comment: Deferred - fatigue and desaturation on 50L heated HFNC (100% FiO2) + NRB  Ambulation/Gait                 Stairs             Wheelchair  Mobility    Modified Rankin (Stroke Patients Only)       Balance Overall balance assessment: Needs assistance Sitting-balance support: Feet supported;Bilateral upper extremity supported Sitting balance-Leahy Scale: Fair Sitting balance - Comments: Able to reach items off breakfast tray while seated EOB                                    Cognition Arousal/Alertness: Awake/alert Behavior During Therapy: Flat affect Overall Cognitive Status: No family/caregiver present to determine baseline cognitive functioning                                 General Comments: Pt with eyes closed but easily alerted to verbal/tactile stimuli, following gestural commands appropriately and able to make needs known. Limited participation due to desaturation, seems very fatigued      Exercises      General Comments General comments (skin integrity, edema, etc.): Pt removed NRB quickly to take bite of banana and again for sip of water, SpO2 down to 69% on 50L HHFNC (100% FiO2) + 15L NRB; took 10-15 min to recover to 87-90% with rest. HR 101-120s, RR 15-40s. Resting BP 117/64, post-mobility BP 165/78 (via L lower leg)      Pertinent Vitals/Pain Pain Assessment: Faces Faces Pain Scale: Hurts a little bit Pain Location: Generalized Pain Descriptors / Indicators: Discomfort;Grimacing Pain  Intervention(s): Monitored during session;Limited activity within patient's tolerance    Home Living                      Prior Function            PT Goals (current goals can now be found in the care plan section) Progress towards PT goals: Not progressing toward goals - comment(limited by respiratory status)    Frequency    Min 3X/week      PT Plan Current plan remains appropriate    Co-evaluation              AM-PAC PT "6 Clicks" Mobility   Outcome Measure  Help needed turning from your back to your side while in a flat bed without using bedrails?: A  Little Help needed moving from lying on your back to sitting on the side of a flat bed without using bedrails?: A Little Help needed moving to and from a bed to a chair (including a wheelchair)?: A Little Help needed standing up from a chair using your arms (e.g., wheelchair or bedside chair)?: A Little Help needed to walk in hospital room?: A Lot Help needed climbing 3-5 steps with a railing? : Total 6 Click Score: 15    End of Session Equipment Utilized During Treatment: Oxygen Activity Tolerance: Treatment limited secondary to medical complications (Comment);Patient limited by fatigue Patient left: in bed;with call bell/phone within reach;with bed alarm set Nurse Communication: Mobility status PT Visit Diagnosis: Difficulty in walking, not elsewhere classified (R26.2)     Time: 1194-1740 PT Time Calculation (min) (ACUTE ONLY): 15 min  Charges:  $Therapeutic Activity: 8-22 mins                    Ina Homes, PT, DPT Acute Rehabilitation Services  Pager [phone] Office [phone]  Malachy Chamber 09/04/2019, 12:20 PM

## 2019-09-04 NOTE — Progress Notes (Signed)
Attempted multiple times to give patient his medications, however he refused most of them. Post conversation concerning goals of care with attending physician, pt. has become very withdrawn, does not want to eat anything and refuses meds. Pt. states, "let me die in my sleep and be comfortable" and has repeated this multiple times. Son made aware as well as MD of all the aforementioned circumstances.

## 2019-09-04 NOTE — Progress Notes (Signed)
Pt. now comfort care and pt.'s son and wife came to visit patient for 15 min with the proper PPE donned.

## 2019-09-04 NOTE — Progress Notes (Signed)
Pt removed NRB to eat an [place] Icee. Pts SpO2 dropped to 65% even while still wearing heated hi flow nasal cannula. RN increased pts FiO2 to 100% and 70L to help his respiratory drive recover. RN decreased flow back to 50L once pt was able to recover and maintain SpO2>88%. RN encouraged pt to leave NRB mask on. Will continue to monitor closely. Caswell Corwin, RN 09/04/19 6:46 AM

## 2019-09-04 NOTE — Progress Notes (Signed)
Assisted tele visit to patient with son.  Aaron Gladney Ann, RN  

## 2019-09-13 NOTE — Progress Notes (Signed)
Wasted 80 ml morphine gtt with Annabelle Harman, Charity fundraiser.

## 2019-09-13 NOTE — Discharge Summary (Signed)
Death Summary  Aaron Blair:[id number] DOB: April 09, 1934 DOA: Aug 20, 2019  PCP: No primary care provider on file.  Admit date: 08-20-19 Date of Death: 09-15-2019  Final Diagnoses:  Principal Problem:   Acute hypoxemic respiratory failure due to severe acute respiratory syndrome coronavirus 2 (SARS-CoV-2) disease (HCC) Severe COVID-19 pneumonia (HCC) COPD with emphysema(HCC)   Chronic diastolic CHF (congestive heart failure) (HCC)   Former smoker   Hypertension   Hyperlipidemia   Diabetes type 2, uncontrolled (HCC)   Demand ischemia (HCC)   Acute respiratory distress syndrome (ARDS) due to COVID-19 virus (HCC)   Acute on chronic respiratory failure with hypoxia (HCC)   Uncontrolled type 2 diabetes mellitus with complication (HCC)   Elevated troponin   Essential hypertension    History of present illness:  21 [place] male with history of emphysema, type 2 diabetes mellitus, dyslipidemia, hypertension was admitted to [facility] on 20-Aug-2023 with severe hypoxic respiratory failure from Covid,  Hospital Course:  89 [place] male with history of emphysema, type 2 diabetes mellitus, dyslipidemia, hypertension was admitted to [facility] on 08-20-23 with severe hypoxic respiratory failure from Covid, he completed his course of remdesivir, Decadron, was given Actemra x2, prolonged therapy for respiratory failure, unfortunately continued to worsen, imaging notes patchy heterogenous bilateral airspace disease consistent with sequelae of infection, central lobar emphysema, fibrotic phase ARDS, prognosis felt to be very poor,  -He has been requiring 15 to 20 L high flow nasal cannula in addition to a nonrebreather mask, transferred to [facility] yesterday evening so that his family would be able to visit him, unfortunately had to be transferred back to the ICU given worsening oxygen requirements. -He is now on 50 heated high flow nasal cannula, in addition to a nonrebreather  mask -Picked up this patient today 1/23 from my partner Dr.Woods at [facility], please see details below    Severe Acute on chronic hypoxic respiratory failure/ COVID pneumonia ARDS -Has underlying emphysema, worsened by Covid pneumonia, fibrotic phase ARDS -Completed course of IV Decadron, remdesivir, Actemra first dose on 12/30 and second dose on 1/7 -Former smoker, 60-pack-year history of smoking quit in 2012 -With worsening oxygen requirements, currently on 50 L of heated high flow and nonrebreather mask -I talked with the patient with a [place] interpreter today, discussed the futility of intubation given prolonged respiratory failure and limited utility at this point,  -Patient was told me twice that he wants to die in his sleep, he does not want to be put on a ventilator -PCCM discussed with patient and family as well as, now DNR -Palliative medicine consulted for goals of care -He has had 2 CT angiograms this admission which were negative for pulmonary embolism -d/w pulmonary critical care attending Dr.Yacoub who signed off and recommended Palliative care -After further discussions with patient's son and the patient using [place] interpreter decision was made to pursue comfort care, eventually died on 09/14/22  Severe emphysema/COPD -See above  Type 2 diabetes mellitus -Hemoglobin A1c is 9.8  Elevated D-dimer -CTA 12/30 and 1/7 - for PE -Lower extremity Dopplers 1/2 - for DVT -Remained on lovenox BID  Demand ischemia -2D echocardiogram was completed earlier this admission, not consistent with ACS -Not in any condition for cardiac work-up  Acute on chronic diastolic CHF -Echo with preserved EF, has been getting as needed IV Lasix -Diuresed with IV Lasix again today   Time: Signed:  Zannie Cove  [facility] 09/06/2019, 2:34 PM

## 2019-09-13 NOTE — Progress Notes (Addendum)
Patient on comfort care/DNR passed away.  Pulse and heart rate/rythm check performed by two RNs.  No pulse or breaths.    Family and provider notified.  Donor services contacted...not a candidate.  0045: family here to see deceased patient 0130: Patient's body tranported to the hospital morgue

## 2019-09-13 DEATH — deceased

## 2019-09-29 DIAGNOSIS — E43 Unspecified severe protein-calorie malnutrition: Secondary | ICD-10-CM | POA: Diagnosis present
